# Patient Record
Sex: Male | Born: 1965 | Race: White | Hispanic: No | Smoking: Current every day smoker
Health system: Southern US, Community
[De-identification: ages and names within clinical notes are randomized; demographics above are authoritative.]

## PROBLEM LIST (undated history)

## (undated) DIAGNOSIS — G43909 Migraine, unspecified, not intractable, without status migrainosus: Secondary | ICD-10-CM

## (undated) DIAGNOSIS — N289 Disorder of kidney and ureter, unspecified: Secondary | ICD-10-CM

## (undated) DIAGNOSIS — E119 Type 2 diabetes mellitus without complications: Secondary | ICD-10-CM

## (undated) DIAGNOSIS — K219 Gastro-esophageal reflux disease without esophagitis: Secondary | ICD-10-CM

## (undated) DIAGNOSIS — I10 Essential (primary) hypertension: Secondary | ICD-10-CM

---

## 2012-10-05 ENCOUNTER — Observation Stay (HOSPITAL_COMMUNITY)
Admission: EM | Admit: 2012-10-05 | Discharge: 2012-10-06 | Disposition: A | Discharge status: Home / Self Care | Payer: MEDICAID | Attending: Family Medicine | Admitting: Family Medicine

## 2012-10-05 ENCOUNTER — Encounter (HOSPITAL_COMMUNITY): Payer: Self-pay | Admitting: *Deleted

## 2012-10-05 ENCOUNTER — Emergency Department (HOSPITAL_COMMUNITY): Payer: Self-pay

## 2012-10-05 DIAGNOSIS — R079 Chest pain, unspecified: Secondary | ICD-10-CM | POA: Insufficient documentation

## 2012-10-05 DIAGNOSIS — E860 Dehydration: Secondary | ICD-10-CM

## 2012-10-05 DIAGNOSIS — M79609 Pain in unspecified limb: Secondary | ICD-10-CM | POA: Insufficient documentation

## 2012-10-05 DIAGNOSIS — Z720 Tobacco use: Secondary | ICD-10-CM | POA: Diagnosis present

## 2012-10-05 DIAGNOSIS — I10 Essential (primary) hypertension: Secondary | ICD-10-CM

## 2012-10-05 DIAGNOSIS — R110 Nausea: Secondary | ICD-10-CM | POA: Diagnosis present

## 2012-10-05 DIAGNOSIS — F17200 Nicotine dependence, unspecified, uncomplicated: Secondary | ICD-10-CM | POA: Insufficient documentation

## 2012-10-05 DIAGNOSIS — R059 Cough, unspecified: Secondary | ICD-10-CM | POA: Insufficient documentation

## 2012-10-05 DIAGNOSIS — R05 Cough: Secondary | ICD-10-CM | POA: Insufficient documentation

## 2012-10-05 DIAGNOSIS — R42 Dizziness and giddiness: Principal | ICD-10-CM | POA: Diagnosis present

## 2012-10-05 HISTORY — DX: Gastro-esophageal reflux disease without esophagitis: K21.9

## 2012-10-05 HISTORY — DX: Migraine, unspecified, not intractable, without status migrainosus: G43.909

## 2012-10-05 LAB — COMPREHENSIVE METABOLIC PANEL
ALT: 21 U/L (ref 0–53)
CO2: 28 mEq/L (ref 19–32)
Calcium: 10.2 mg/dL (ref 8.4–10.5)
Creatinine, Ser: 0.83 mg/dL (ref 0.50–1.35)
GFR calc Af Amer: >90 mL/min (ref >90)
GFR calc non Af Amer: >90 mL/min (ref >90)
Glucose, Bld: 102 mg/dL — ABNORMAL HIGH (ref 70–99)
Sodium: 141 mEq/L (ref 135–145)
Total Protein: 7.6 g/dL (ref 6.0–8.3)

## 2012-10-05 LAB — CBC
MCH: 32.2 pg (ref 26.0–34.0)
MCHC: 37.6 g/dL — ABNORMAL HIGH (ref 30.0–36.0)
MCV: 85.9 fL (ref 78.0–100.0)
Platelets: 201 10*3/uL (ref 150–400)
RBC: 5.52 MIL/uL (ref 4.22–5.81)

## 2012-10-05 LAB — POCT I-STAT TROPONIN I

## 2012-10-05 MED ORDER — SODIUM CHLORIDE 0.9 % IV BOLUS (SEPSIS)
500.0000 mL | Freq: Once | INTRAVENOUS | Status: AC
  Administered 2012-10-05: 500 mL via INTRAVENOUS

## 2012-10-05 NOTE — ED Provider Notes (Signed)
History     CSN: [id number]  Arrival date & time 10/05/12  4098   First MD Initiated Contact with Patient 10/05/12 2001      Chief Complaint  Patient presents with  . Dizziness  . Arm Pain    (Consider location/radiation/quality/duration/timing/severity/associated sxs/prior treatment) Patient is a 47 y.o. male presenting with arm pain. The history is provided by the patient.  Arm Pain Associated symptoms include headaches. Pertinent negatives include no chest pain, no abdominal pain and no shortness of breath.   patient presents with dizziness. He states for the last 4 days she is having difficulty walking. He states when he stands up or sits up he gets lightheaded. He states it feels more like he is going to pass out and if he is spinning. He has had some episodes of chest pain also. He states that he had some nausea also. He states it is worse when he sits up. He states his been having difficulty walking. He has occasional headaches. He has chest pain, but is not necessarily associated with exertion. No fevers. No cough. 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 He states he sometimes feels his right ear pop. No localizing numbness or weakness. No abdominal pain. He states he has been gaining weight. He's had occasional cough. He does not see doctors.  Past Medical History  Diagnosis Date  . Migraines     History reviewed. No pertinent past surgical history.  No family history on file.  History  Substance Use Topics  . Smoking status: Current Every Day Smoker -- 2.00 packs/day    Types: Cigarettes  . Smokeless tobacco: Not on file  . Alcohol Use: No      Review of Systems  Constitutional: Negative for activity change, appetite change and fatigue.  HENT: Negative for neck stiffness.   Eyes: Negative for pain.  Respiratory: Negative for chest tightness and shortness of breath.   Cardiovascular: Negative for chest pain and leg swelling.  Gastrointestinal: Positive  for nausea. Negative for vomiting, abdominal pain and diarrhea.  Genitourinary: Negative for flank pain.  Musculoskeletal: Negative for back pain.  Skin: Negative for rash.  Neurological: Positive for light-headedness and headaches. Negative for tremors, seizures, syncope and numbness.  Psychiatric/Behavioral: Negative for behavioral problems.    Allergies  Codeine and Penicillins  Home Medications   Current Outpatient Rx  Name  Route  Sig  Dispense  Refill  . ibuprofen (ADVIL,MOTRIN) 200 MG tablet   Oral   Take 1,000 mg by mouth daily as needed for pain.           BP 126/63  Pulse 61  Temp(Src) 97.5 F (36.4 C) (Oral)  Resp 23  Ht 5\' 9"  (1.753 m)  Wt 215 lb (97.523 kg)  BMI 31.74 kg/m2  SpO2 91%  Physical Exam  Nursing note and vitals reviewed. Constitutional: He is oriented to person, place, and time. He appears well-developed and well-nourished.  HENT:  Head: Normocephalic and atraumatic.  Right Ear: External ear normal.  Mild erythema to left TM.  Eyes: EOM are normal. Pupils are equal, round, and reactive to light.  Neck: Normal range of motion. Neck supple.  Cardiovascular: Normal rate, regular rhythm and normal heart sounds.   No murmur heard. Pulmonary/Chest: Effort normal and breath sounds normal.  Abdominal: Soft. Bowel sounds are normal. He exhibits no distension and no mass. There is no tenderness. There is no rebound and no guarding.  Musculoskeletal: Normal range of motion. He  exhibits no edema.  Neurological: He is alert and oriented to person, place, and time. No cranial nerve deficit.  Mild nystagmus with gaze to right. Finger-nose intact bilaterally. Heel shin intact bilaterally. Patient became lightheaded and somewhat flushed when he sat up. Extraocular movements intact. Strength is intact bilaterally.  Skin: Skin is warm and dry.  Psychiatric: He has a normal mood and affect.    ED Course  Procedures (including critical care time)  Labs  Reviewed  CBC - Abnormal; Notable for the following:    Hemoglobin 17.8 (*)    MCHC 37.6 (*)    All other components within normal limits  COMPREHENSIVE METABOLIC PANEL - Abnormal; Notable for the following:    Glucose, Bld 102 (*)    All other components within normal limits  POCT I-STAT TROPONIN I   Dg Chest 2 View  10/05/2012  *RADIOLOGY REPORT*  Clinical Data: Cough.  Chest pain.  Dizziness.  Smoker.  CHEST - 2 VIEW  Comparison: None.  Findings: Cardiomediastinal silhouette unremarkable.  Linear scar or atelectasis in the lingula.  Mildly prominent bronchovascular markings and moderate central peribronchial thickening.  Lungs otherwise clear.  No pleural effusions. Healed the posterior right sixth rib fracture.  Mild degenerative changes involving the thoracic spine.  IMPRESSION: Minimal lingular scar/atelectasis.  Mild changes of bronchitis and/or asthma which may be acute or chronic.  No acute cardiopulmonary disease otherwise.   Original Report Authenticated By: Hulan Saas, M.D.    Ct Head Wo Contrast  10/05/2012  *RADIOLOGY REPORT*  Clinical Data:  Dizziness, nausea and vomiting.  History of migraine headaches.  CT HEAD WITHOUT CONTRAST  Technique: Contiguous axial images were obtained from the base of the skull through the vertex without contrast.  Comparison: None.  Findings: Normal appearing cerebral hemispheres and posterior fossa structures.  Normal size and position of the ventricles.  No intracranial hemorrhage, mass lesion or evidence of acute infarction.  Unremarkable bones and included portions of the paranasal sinuses.  IMPRESSION: Normal examination.   Original Report Authenticated By: Beckie Salts, M.D.      1. Lightheadedness   2. Nausea      Date: 10/05/2012  Rate: 55  Rhythm: sinus bradycardia  QRS Axis: normal  Intervals: normal  ST/T Wave abnormalities: normal  Conduction Disutrbances:none  Narrative Interpretation:   Old EKG Reviewed: none  available    MDM  Patient was lightheaded with sitting up or standing. He's had difficulty ambulating. He is a nonfocal exam except for some mild nystagmus, he does get flushed and feel like his to pass out with sitting or standing. Head CT is reassuring. Lab work is reassuring overall but does have an elevated hemoglobin.patient does not have a primary care doctor has no followup. He'll be admitted to the family practice [facility] for further evaluation treatment.        Juliet Rude. Rubin Payor, MD 10/06/12 1610

## 2012-10-05 NOTE — ED Notes (Addendum)
PT with intermittant L arm pain and neck pain, dizziness, emesis since Thursday.  States he doesn't go to doctors.  Denies chest pain.  States he does get migraines, but this time it is the dizziness that is bothering him. Pt photophobic.

## 2012-10-06 ENCOUNTER — Encounter (HOSPITAL_COMMUNITY): Payer: Self-pay | Admitting: General Practice

## 2012-10-06 DIAGNOSIS — F17200 Nicotine dependence, unspecified, uncomplicated: Secondary | ICD-10-CM

## 2012-10-06 DIAGNOSIS — R42 Dizziness and giddiness: Secondary | ICD-10-CM

## 2012-10-06 DIAGNOSIS — I10 Essential (primary) hypertension: Secondary | ICD-10-CM

## 2012-10-06 DIAGNOSIS — E860 Dehydration: Secondary | ICD-10-CM

## 2012-10-06 DIAGNOSIS — R110 Nausea: Secondary | ICD-10-CM

## 2012-10-06 HISTORY — DX: Essential (primary) hypertension: I10

## 2012-10-06 LAB — COMPREHENSIVE METABOLIC PANEL
ALT: 18 U/L (ref 0–53)
AST: 13 U/L (ref 0–37)
CO2: 26 mEq/L (ref 19–32)
Calcium: 8.6 mg/dL (ref 8.4–10.5)
Potassium: 3.8 mEq/L (ref 3.5–5.1)
Sodium: 139 mEq/L (ref 135–145)
Total Protein: 6.3 g/dL (ref 6.0–8.3)

## 2012-10-06 LAB — URINALYSIS, ROUTINE W REFLEX MICROSCOPIC
Bilirubin Urine: NEGATIVE
Glucose, UA: NEGATIVE mg/dL
Hgb urine dipstick: NEGATIVE
Ketones, ur: NEGATIVE mg/dL
Protein, ur: NEGATIVE mg/dL

## 2012-10-06 LAB — CBC
HCT: 42 % (ref 39.0–52.0)
Hemoglobin: 15.2 g/dL (ref 13.0–17.0)
MCH: 30.8 pg (ref 26.0–34.0)
MCHC: 36.2 g/dL — ABNORMAL HIGH (ref 30.0–36.0)

## 2012-10-06 LAB — RAPID URINE DRUG SCREEN, HOSP PERFORMED
Amphetamines: NOT DETECTED
Tetrahydrocannabinol: POSITIVE — AB

## 2012-10-06 MED ORDER — HYDROCHLOROTHIAZIDE 25 MG PO TABS
25.0000 mg | ORAL_TABLET | Freq: Every day | ORAL | Status: DC
  Administered 2012-10-06: 25 mg via ORAL
  Filled 2012-10-06: qty 1

## 2012-10-06 MED ORDER — MECLIZINE HCL 50 MG PO TABS: 50.0000 mg | ORAL_TABLET | Freq: Three times a day (TID) | ORAL | Status: DC | PRN

## 2012-10-06 MED ORDER — MECLIZINE HCL 25 MG PO TABS
50.0000 mg | ORAL_TABLET | Freq: Three times a day (TID) | ORAL | Status: DC
  Administered 2012-10-06: 50 mg via ORAL
  Filled 2012-10-06 (×2): qty 2

## 2012-10-06 MED ORDER — ONDANSETRON HCL 4 MG PO TABS: 4.0000 mg | ORAL_TABLET | Freq: Four times a day (QID) | ORAL | Status: DC | PRN

## 2012-10-06 MED ORDER — SODIUM CHLORIDE 0.9 % IJ SOLN
3.0000 mL | Freq: Two times a day (BID) | INTRAMUSCULAR | Status: DC
  Administered 2012-10-06: 3 mL via INTRAVENOUS

## 2012-10-06 MED ORDER — ACETAMINOPHEN 325 MG PO TABS: 650.0000 mg | ORAL_TABLET | Freq: Four times a day (QID) | ORAL | Status: DC | PRN

## 2012-10-06 MED ORDER — NICOTINE 21 MG/24HR TD PT24
21.0000 mg | MEDICATED_PATCH | Freq: Every day | TRANSDERMAL | Status: DC
  Administered 2012-10-06: 21 mg via TRANSDERMAL
  Filled 2012-10-06 (×2): qty 1

## 2012-10-06 MED ORDER — PANTOPRAZOLE SODIUM 40 MG PO TBEC
40.0000 mg | DELAYED_RELEASE_TABLET | Freq: Every day | ORAL | Status: DC
  Administered 2012-10-06: 40 mg via ORAL
  Filled 2012-10-06: qty 1

## 2012-10-06 MED ORDER — OMEPRAZOLE 20 MG PO CPDR: 20.0000 mg | DELAYED_RELEASE_CAPSULE | Freq: Every day | ORAL | Status: DC

## 2012-10-06 MED ORDER — ACETAMINOPHEN 650 MG RE SUPP: 650.0000 mg | Freq: Four times a day (QID) | RECTAL | Status: DC | PRN

## 2012-10-06 MED ORDER — HYDROCHLOROTHIAZIDE 25 MG PO TABS: 25.0000 mg | ORAL_TABLET | Freq: Every day | ORAL | Status: DC

## 2012-10-06 MED ORDER — ONDANSETRON HCL 4 MG/2ML IJ SOLN: 4.0000 mg | Freq: Four times a day (QID) | INTRAMUSCULAR | Status: DC | PRN

## 2012-10-06 MED ORDER — FLUTICASONE PROPIONATE 50 MCG/ACT NA SUSP
2.0000 | Freq: Every day | NASAL | Status: DC
  Administered 2012-10-06: 2 via NASAL
  Filled 2012-10-06: qty 16

## 2012-10-06 MED ORDER — CALCIUM CARBONATE ANTACID 500 MG PO CHEW
1.0000 | CHEWABLE_TABLET | Freq: Three times a day (TID) | ORAL | Status: DC
  Administered 2012-10-06: 200 mg via ORAL
  Filled 2012-10-06 (×4): qty 1

## 2012-10-06 MED ORDER — MECLIZINE HCL 25 MG PO TABS: 50.0000 mg | ORAL_TABLET | Freq: Three times a day (TID) | ORAL | Status: DC | PRN

## 2012-10-06 MED ORDER — SODIUM CHLORIDE 0.9 % IV SOLN
INTRAVENOUS | Status: DC
  Administered 2012-10-06: 02:00:00 via INTRAVENOUS

## 2012-10-06 NOTE — Care Management Note (Signed)
    Page 1 of 1   10/06/2012     2:15:17 PM   CARE MANAGEMENT NOTE 10/06/2012  Patient:  Julian Atkinson, Julian Atkinson   Account Number:  [id number]  Date Initiated:  10/06/2012  Documentation initiated by:  GRAVES-BIGELOW,Yariana Hoaglund  Subjective/Objective Assessment:   Pt admitted with increasing dizziness and neds outpatient vestibular therapy. Pt is from home with wife. Pt is self employed and has no insurance.     Action/Plan:   CM did speak to pt in regards to finance and then CM called [facility]. Pt has no PCP and the [facility] clinic will f/u outpatient. CM will monitor.   Anticipated DC Date:  10/06/2012   Anticipated DC Plan:  HOME/SELF CARE  In-house referral  Financial Counselor      DC Planning Services  CM consult  Medication Assistance  Indigent [facility] Clinic      Choice offered to / List presented to:             Status of service:  Completed, signed off Medicare Important Message given?   (If response is "NO", the following Medicare IM given date fields will be blank) Date Medicare IM given:   Date Additional Medicare IM given:    Discharge Disposition:  HOME/SELF CARE  Per UR Regulation:  Reviewed for med. necessity/level of care/duration of stay  If discussed at Long Length of Stay Meetings, dates discussed:    Comments:  10-06-12 [address] [place], [place] [phone] CM did speak to pt and he is agreeable to outpatient PT services. CM will fax information to [phone] and they will call pt to set up an appointment. FC did call pt and a financial applicaiton will be sent to pt and he to fill out and turn in. CM did fax information to [facility] for services. No further needs from CM at this time.

## 2012-10-06 NOTE — Evaluation (Signed)
Occupational Therapy Evaluation Patient Details Name: Julian Atkinson MRN: [id number] DOB: Sep 09, 1965 Today's Date: 10/06/2012 Time: 4098-1191 (first attempt 801 - 814) OT Time Calculation (min): 25 min  OT Assessment / Plan / Recommendation Clinical Impression  47 yo male admitted with dizziness > 5 days with n/v , diaphoresis flushed and chills. Ot to follow acutely. Recommend outpatient vestibular follow up    OT Assessment  Patient needs continued OT Services    Follow Up Recommendations  Outpatient OT    Barriers to Discharge      Equipment Recommendations       Recommendations for Other Services    Frequency  Min 2X/week    Precautions / Restrictions Precautions Precautions: Fall   Pertinent Vitals/Pain 9 out 10 dizziness at end of session Due to vestibular testing - mobility not tested at this time. OT/ PT to follow up for mobility    ADL  Eating/Feeding: Modified independent Where Assessed - Eating/Feeding: Bed level ADL Comments: Session focused on vestibular testing. Pt on arrival 1 out 10 dizziness. Pt describes dizziness as patient spinning, Hx of deafness in Lt ear since childhood, sinus congestion, bil ear popping (like being in the mountains was patient description), mother with history of vestibular deficits, spinning last for > 10 hours each time, pt has sound / light sensitivity, and dizziness with all neck extension    OT Diagnosis:    OT Problem List: Impaired balance (sitting and/or standing);Decreased coordination OT Treatment Interventions: Self-care/ADL training;DME and/or AE instruction;Therapeutic activities;Patient/family education;Balance training   OT Goals Acute Rehab OT Goals OT Goal Formulation: With patient/family Time For Goal Achievement: 10/20/12 Potential to Achieve Goals: Good ADL Goals Pt Will Perform Grooming: Independently;Supported ADL Goal: Grooming - Progress: Goal set today Pt Will Transfer to Toilet: Independently;Regular  height toilet ADL Goal: Statistician - Progress: Goal set today  Visit Information  Last OT Received On: 10/06/12 Assistance Needed: +1 PT/OT Co-Evaluation/Treatment: Yes    Subjective Data  Subjective: "this is the longest it's ever latest before"- pt reports now 7 days of dizziness and symptoms Patient Stated Goal: to return home    Prior Functioning     Home Living Lives With: Spouse Available Help at Discharge: Family Prior Function Driving: Yes Vocation: Full time employment Communication Communication: No difficulties Dominant Hand: Right         Vision/Perception Vision - History Baseline Vision: No visual deficits Vision - Assessment Eye Alignment: Within Functional Limits Vision Assessment: Vision tested Ocular Range of Motion: Within Functional Limits Saccades: Within functional limits Additional Comments: Pt provided  VOR Cancellation with positive result, Pt with neck flexion / extension with gaze stablization negative, neck rotation right / left with negative results, pt with postive head thrust test with Lt > RT. Pt with Lt side deafness as well. Pt provided dix hallpike with no nystagmus noted. Pt did report dizziness. Pt with 9 out 10 dizziness s./p testing. pt with greater dizziness with horizontal head movement than vertical this session    Cognition  Cognition Overall Cognitive Status: Appears within functional limits for tasks assessed/performed Arousal/Alertness: Awake/alert Orientation Level: Appears intact for tasks assessed Behavior During Session: [facility] for tasks performed    Extremity/Trunk Assessment Right Upper Extremity Assessment RUE ROM/Strength/Tone: Within functional levels Left Upper Extremity Assessment LUE ROM/Strength/Tone: Within functional levels Right Lower Extremity Assessment RLE ROM/Strength/Tone: WFL for tasks assessed (not formally assessed for strength ROM) RLE Coordination: WFL - gross motor (little trouble with toe  tapping other WNL) Left  Lower Extremity Assessment LLE Coordination: WFL - gross motor (little trouble with toe tapping other wise WNL)     Mobility Bed Mobility Bed Mobility: Supine to Sit;Sit to Supine Supine to Sit: 7: Independent Sit to Supine: 7: Independent Transfers Transfers: Not assessed     Exercise Other Exercises Other Exercises: Oculomotor assessment performed, see OT notes for details Other Exercises: Positional testing performed: see OT notes for details   Balance Balance Balance Assessed: Yes Static Sitting Balance Static Sitting - Balance Support: Feet supported Static Sitting - Level of Assistance: 7: Independent Static Sitting - Comment/# of Minutes: approx 15 minutes during vestibular exam   End of Session OT - End of Session Activity Tolerance: Patient tolerated treatment well Patient left: in bed;with call bell/phone within reach;with family/visitor present Nurse Communication: Mobility status  GO     Harrel Carina [facility] 10/06/2012, 10:18 AM Pager: [phone]

## 2012-10-06 NOTE — H&P (Signed)
History and Physical Note Family Medicine Teaching Service Julian Quigley M. Mikel Cella, MD Service Pager: [phone]  Julian Atkinson is an 47 y.o. male.   Chief Complaint: lightheadedness HPI: Pt is a 47 yo M with PMH of migraines who presented to the ED with a 6 day history of lightheadedness. He states with any position change he develops the sensation of feeling lightheaded (Not room spinning or dizzy) with associated diaphoresis, flushed/chills and N/V. He states he has had intermittent dizziness in the past that resolves within 24 hours with rest but this episode is different. He has not had a migraine lately, and does not report a headache associated with current symptoms. He states he does not lose his vision or feel like he is going to black out. Episodes resolve with lying down flat and he does not really know if moving his head makes it worse, he only notices it with body position changes. Denies fevers, recent illnesses, chest pain, shortness of breath, rash weakness or numbness. Does endorse neck pain. He has been able to eat and drink normally with no nausea until he sits or stands up.  ED course: Received 1L total of fluids. Head CT negative. Labs show elevated HgB. Pt is not able to stand or ambulate, therefore FMTS was called for admission.  Past Medical History  Diagnosis Date  . Migraines    History reviewed. No pertinent past surgical history.  Family History: Mom has migraines and vertigo. Dad died of MI at age 83  Social History:  reports that he has been smoking Cigarettes.  He has been smoking about 2.00 packs per day. He does not have any smokeless tobacco history on file. He reports that he does not drink alcohol. His drug history is not on file. Lives with wife. Works as a Photographer.  Allergies:  Allergies  Allergen Reactions  . Codeine Nausea And Vomiting  . Penicillins Other (See Comments)    Childhood reaction   Prior to Admission medications   Medication  Sig Start Date End Date Taking? Authorizing Provider  ibuprofen (ADVIL,MOTRIN) 200 MG tablet Take 1,000 mg by mouth daily as needed for pain.   Yes Historical Provider, MD    Results for orders placed during the hospital encounter of 10/05/12 (from the past 48 hour(s))  CBC     Status: Abnormal   Collection Time    10/05/12  6:56 PM      Result Value Range   WBC 9.9  4.0 - 10.5 K/uL   RBC 5.52  4.22 - 5.81 MIL/uL   Hemoglobin 17.8 (*) 13.0 - 17.0 g/dL   HCT 53.6  64.4 - 03.4 %   MCV 85.9  78.0 - 100.0 fL   MCH 32.2  26.0 - 34.0 pg   MCHC 37.6 (*) 30.0 - 36.0 g/dL   Comment: RULED OUT INTERFERING SUBSTANCES   RDW 11.9  11.5 - 15.5 %   Platelets 201  150 - 400 K/uL  COMPREHENSIVE METABOLIC PANEL     Status: Abnormal   Collection Time    10/05/12  6:56 PM      Result Value Range   Sodium 141  135 - 145 mEq/L   Potassium 3.7  3.5 - 5.1 mEq/L   Chloride 101  96 - 112 mEq/L   CO2 28  19 - 32 mEq/L   Glucose, Bld 102 (*) 70 - 99 mg/dL   BUN 14  6 - 23 mg/dL   Creatinine, Ser 7.42  0.50 -  1.35 mg/dL   Calcium 16.1  8.4 - 09.6 mg/dL   Total Protein 7.6  6.0 - 8.3 g/dL   Albumin 4.0  3.5 - 5.2 g/dL   AST 15  0 - 37 U/L   ALT 21  0 - 53 U/L   Alkaline Phosphatase 78  39 - 117 U/L   Total Bilirubin 0.5  0.3 - 1.2 mg/dL   GFR calc non Af Amer >90  >90 mL/min   GFR calc Af Amer >90  >90 mL/min   Comment:            The eGFR has been calculated     using the CKD EPI equation.     This calculation has not been     validated in all clinical     situations.     eGFR's persistently     <90 mL/min signify     possible Chronic Kidney Disease.  POCT I-STAT TROPONIN I     Status: None   Collection Time    10/05/12  7:09 PM      Result Value Range   Troponin i, poc 0.00  0.00 - 0.08 ng/mL   Comment 3            Comment: Due to the release kinetics of cTnI,     a negative result within the first hours     of the onset of symptoms does not rule out     myocardial infarction with  certainty.     If myocardial infarction is still suspected,     repeat the test at appropriate intervals.   Dg Chest 2 View  10/05/2012  *RADIOLOGY REPORT*  Clinical Data: Cough.  Chest pain.  Dizziness.  Smoker.  CHEST - 2 VIEW  Comparison: None.  Findings: Cardiomediastinal silhouette unremarkable.  Linear scar or atelectasis in the lingula.  Mildly prominent bronchovascular markings and moderate central peribronchial thickening.  Lungs otherwise clear.  No pleural effusions. Healed the posterior right sixth rib fracture.  Mild degenerative changes involving the thoracic spine.  IMPRESSION: Minimal lingular scar/atelectasis.  Mild changes of bronchitis and/or asthma which may be acute or chronic.  No acute cardiopulmonary disease otherwise.   Original Report Authenticated By: Hulan Saas, M.D.    Ct Head Wo Contrast  10/05/2012  *RADIOLOGY REPORT*  Clinical Data:  Dizziness, nausea and vomiting.  History of migraine headaches.  CT HEAD WITHOUT CONTRAST  Technique: Contiguous axial images were obtained from the base of the skull through the vertex without contrast.  Comparison: None.  Findings: Normal appearing cerebral hemispheres and posterior fossa structures.  Normal size and position of the ventricles.  No intracranial hemorrhage, mass lesion or evidence of acute infarction.  Unremarkable bones and included portions of the paranasal sinuses.  IMPRESSION: Normal examination.   Original Report Authenticated By: Beckie Salts, M.D.     ROS See HPI above for pertinent findings. Otherwise, ROS negative.  Blood pressure 126/63, pulse 61, temperature 97.5 F (36.4 C), temperature source Oral, resp. rate 23, height 5\' 9"  (1.753 m), weight 215 lb (97.523 kg), SpO2 91.00%. Physical Exam  Gen: Awake, alert. Lying down in bed in no distress HEENT: AT, [place]. MMM, some posterior pharynx erythema. EOM with few small beats of horizontal nystagmus appreciated. Pt had difficultly looking up due to dizziness.   Neck: Supple, no bony tenderness. No LAD Cardio: RRR, no murmur appreciated Pulm: CTAB Abd: Obese, soft, nontender Extremities: No edema or rashes Neuro:   CN: Smile  symmetric, intact EOM. No sensory changes. Shoulder shrug normal. Decreased hearing on left due to chronic hearing loss.   UE: 5/5 strength bilaterally with no sensory changes.  LE: 5/5 strength bilaterally. No sensory changes appreciated.  Coordination: Heel to shin normal. Did not stand due to increased lightheaded with sitting and fall risk without assistance in the emergency room.  Assessment/Plan 47 yo previously healthy M with 6 day history of feeling lightheaded with positional changes  # Lightheaded - Pt with history of migraines now with lightheaded/flushed/diaphoretic with sitting or standing, as well as noted to have nystagmus on exam. Sounds most consistent with peripheral vestibular dysfunction given the exam findings, history of migraines, N/V. Central vertigo is in differential but less likely without focal neurologic findings. Other differential diagnosis include dehydration (hemoconcentrated on CBC), arrhythmia (not seen on EKG or telemetry), complex status migrainosus, stroke (not seen on CT scan), meningitis (no fevers or elevated WBC but does report vague neck pain) or orthostatic hypotension (although not seen on vital signs at arrival.) - Observation status, Dr. Leveda Anna attending - Monitor on telemetry for any cardiac cause. Could likely d/c tele tomorrow if no events seen - Fall precautions, up with assistance - Meclizine for symptomatic relief - Zofran as needed for nausea   - UA and drug screen now - Repeat CBC and CMet in the AM - Continue fluids at 100cc/hr and encourage po intake - PT and OT evaluation  - Vestibular rehab evaluation - I did not perform Dix-Hallpike since patient had nystagmus on exam. I also did not perform Epley given constraints of emergency department, but this could be considered  as well - No additional labs ordered, but could consider TSH - Neuro checks q shift - Orthostatic vital signs in the morning  # Tobacco use- Pt smokes 2 ppd - Nicotine patch q24 hours starting now - Nurse to provide smoking cessation information  #FEN/GI- Regular diet. NS @ 100cc/hr overnight # PPX- SCD # Code status- Full code # Dispo- Pending further work up and evaluation.   Noela Brothers 10/06/2012, 12:02 AM

## 2012-10-06 NOTE — Progress Notes (Signed)
FMTS Attending Daily Note: Sara Neal MD [phone] pager office [phone] I  have seen and examined this patient, reviewed their chart. I have discussed this patient with the resident. I agree with the resident's findings, assessment and care plan. 

## 2012-10-06 NOTE — Progress Notes (Signed)
Pt discharged to home per MD order. Pt received and reviewed all discharge instructions and medication information including follow-up appointments and prescriptions. Pt verbalized understanding. Pt alert and oriented at discharge with no complaints of pain.  Pt escorted to private vehicle via wheelchair by nurse tech. Coates, Chantilly Linskey Elizabeth  

## 2012-10-06 NOTE — Evaluation (Signed)
Physical Therapy Evaluation Patient Details Name: Julian Atkinson MRN: [id number] DOB: 1966/03/23 Today's Date: 10/06/2012 Time: 4098-1191 PT Time Calculation (min): 24 min  PT Assessment / Plan / Recommendation Clinical Impression  Patient is a 47 y/o male admitted with acute onset dizziness and nausea lasting 7 days.  He has history of similar problems most of his adult life, however not lasting this long.  He presents with symptoms likely most resembling vestibular neuritis with long h/o left ear hearing loss, positve refixation with left head thrust test and right gaze holding nystagmus.  He will benefit from skilled PT in the acute setting and outpatient vestibular rehab to improve symptoms and return to independent with IADL's.  Will see one more visit today to further assess mobility and instruct in initial vestibular HEP.    PT Assessment  Patient needs continued PT services    Follow Up Recommendations  Outpatient PT (Neurorehab)          Equipment Recommendations  None recommended by PT       Frequency Min 3X/week    Precautions / Restrictions Precautions Precautions: Fall   Pertinent Vitals/Pain Denies pain      Mobility  Bed Mobility Bed Mobility: Supine to Sit;Sit to Supine Supine to Sit: 7: Independent Sit to Supine: 7: Independent    Exercises Other Exercises Other Exercises: Oculomotor assessment performed, see OT notes for details Other Exercises: Positional testing performed: see OT notes for details   PT Diagnosis: Other (comment) (Dizziness and Giddiness)  PT Problem List: Decreased activity tolerance;Decreased mobility PT Treatment Interventions: Gait training;Balance training;Neuromuscular re-education;Patient/family education;Therapeutic activities   PT Goals Acute Rehab PT Goals PT Goal Formulation: With patient Time For Goal Achievement: 10/08/12 Potential to Achieve Goals: Good Pt will go Sit to Stand: Independently PT Goal: Sit to Stand -  Progress: Goal set today Pt will go Stand to Sit: Independently PT Goal: Stand to Sit - Progress: Goal set today Pt will Ambulate: >150 feet;with modified independence;with least restrictive assistive device PT Goal: Ambulate - Progress: Goal set today Pt will Perform Home Exercise Program: Independently PT Goal: Perform Home Exercise Program - Progress: Goal set today Additional Goals Additional Goal #1: Patient independent in visual compensation strategies to allow independent or modified independent ambulation as noted above with dizziness </=6/10 PT Goal: Additional Goal #1 - Progress: Goal set today  Visit Information  Last PT Received On: 10/06/12 Assistance Needed: +1    Subjective Data  Subjective: I have been dizzy 7 days this time.  Hits me usually 2-3x/yr lasts several hours and can sleep it off.  No headache though history of migraines.  Has left hearing loss since child.  Episodes started around 56 years of age.  Feels like does after spinning around, lasts several hours, has diaphoresis and nausea and popping right ear (new this episode,) and h/o sinus trouble.  Feels this is worst episode ever.  Okay as long as flat in bed.  Noise and light aggravate, has difficulty with looking up.  Denies any falls or head trauma.  Unsure of onset of hearing loss (? as a child hit side of head in the car.)   Patient Stated Goal: Hopes to improve dizziness and figure out how to manage acute episodes.   Prior Functioning  Home Living Lives With: Spouse Available Help at Discharge: Family Prior Function Driving: Yes Vocation: Full time employment Communication Communication: No difficulties Dominant Hand: Right    Cognition  Cognition Overall Cognitive Status: Appears within  functional limits for tasks assessed/performed Arousal/Alertness: Awake/alert Orientation Level: Appears intact for tasks assessed Behavior During Session: [facility] for tasks performed    Extremity/Trunk Assessment  Right Lower Extremity Assessment RLE ROM/Strength/Tone: [facility] for tasks assessed (not formally assessed for strength ROM) RLE Coordination: WFL - gross motor (little trouble with toe tapping other WNL) Left Lower Extremity Assessment LLE Coordination: WFL - gross motor (little trouble with toe tapping other wise WNL)   Balance Balance Balance Assessed: Yes Static Sitting Balance Static Sitting - Balance Support: Feet supported Static Sitting - Level of Assistance: 7: Independent Static Sitting - Comment/# of Minutes: approx 15 minutes during vestibular exam  End of Session PT - End of Session Activity Tolerance: Other (comment) (limited by dizziness, nausea) Patient left: in bed;with family/visitor present  GP     [facility] 10/06/2012, 9:31 AM  Sheran Lawless, PT [phone] 10/06/2012

## 2012-10-06 NOTE — Discharge Summary (Signed)
Family Medicine Teaching [facility] Discharge Summary  Patient name: Julian Atkinson Medical record number: [id number] Date of birth: May 22, 1966 Age: 47 y.o. Gender: male Date of Admission: 10/05/2012  Date of Discharge: 10/06/12 Admitting Physician: Sanjuana Letters, MD  Primary Care Provider: Patient will   Indication for Hospitalization: Lightheadedness, Inability to ambulate  Discharge Diagnoses:  Principal Problem:   Lightheadedness Active Problems:   Tobacco abuse   Nausea   Dehydration   High blood pressure  Brief Hospital Course:  47 year old male with history of tobacco abuse and reported history of intermittent dizziness and migraine headache who presented with 6 day history of lightheadedness.  Lightheadedness was associated positional changes and accompanied by diaphoresis, flushing, and nausea/vomiting.    1) Lightheadedness - On admission, physical exam notable for nystagmus. - Symptoms improved following IV fluids, Meclizine, and Zofran. - Patient was seen by PT/OT during admission for likely peripheral vertigo. PT agreed with peripheral cause (vestibular neuritis) and recommend outpatient vestibular rehab.   2) Dehydration  - Patient found to be hemoconcentrated on admission with Hb of 17.8 - Patient was treated with IV fluids (1 L NS bolus, followed by NS at 100 mL/hr)  3) Tobacco abuse - Patient was given Nicotine patch during admission and also received smoking cessation counseling.  4) High blood pressure - Multiple blood pressure readings were elevated >140/90. - Patient was started on HCTZ 25 mg. - Will follow up with PCP regarding confirmation of HTN and titration/addition of medication.  Significant Labs and Imaging:   CBC BMET   Recent Labs Lab 10/05/12 1856 10/06/12 0447  WBC 9.9 7.7  HGB 17.8* 15.2  HCT 47.4 42.0  PLT 201 178    Recent Labs Lab 10/05/12 1856 10/06/12 0447  NA 141 139  K 3.7 3.8  CL 101 104  CO2 28 26  BUN  14 14  CREATININE 0.83 0.89  GLUCOSE 102* 100*  CALCIUM 10.2 8.6     Dg Chest 2 View  10/05/2012 IMPRESSION: Minimal lingular scar/atelectasis. Mild changes of bronchitis and/or asthma which may be acute or chronic. No acute cardiopulmonary disease otherwise.   Ct Head Wo Contrast  10/05/2012 IMPRESSION: Normal examination.   Procedures: None  Consultations: None  Discharge Medications:    Medication List    STOP taking these medications       ibuprofen 200 MG tablet  Commonly known as:  ADVIL,MOTRIN      TAKE these medications       hydrochlorothiazide 25 MG tablet  Commonly known as:  HYDRODIURIL  Take 1 tablet (25 mg total) by mouth daily.     meclizine 50 MG tablet  Commonly known as:  ANTIVERT  Take 1 tablet (50 mg total) by mouth 3 (three) times daily as needed.     omeprazole 20 MG capsule  Commonly known as:  PRILOSEC  Take 1 capsule (20 mg total) by mouth daily.       Issues for Follow Up:  1) Patient was hypertensive during admission.  Consider starting medication as outpatient. 2) Assess for improvement/resolution of symptoms  Outstanding Results: None  Discharge Instructions:  Patient was counseled important signs and symptoms that should prompt return to medical care, changes in medications, dietary instructions, activity restrictions, and follow up appointments.   Follow-up Information   Follow up with Everlene Other, DO On 10/13/2012. (1:45 pm)    Contact information:   [address] [place] [address] [phone]  Discharge Condition: Stable. Discharged home.  Adriana Simas Canadian, DO 10/06/2012, 12:12 PM

## 2012-10-06 NOTE — Progress Notes (Signed)
Physical Therapy Treatment Patient Details Name: Julian Atkinson MRN: [id number] DOB: 16-Sep-1965 Today's Date: 10/06/2012 Time: 8295-6213 PT Time Calculation (min): 25 min  PT Assessment / Plan / Recommendation Comments on Treatment Session  Patient able to participate in visual compensatory technique to assist with ambulation and decreased symptoms,  Did c/o increased symptoms with visual adaptation exercise, but not to point of nausea.  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      Follow Up Recommendations  Outpatient PT           Equipment Recommendations  None recommended by PT       Frequency Min 3X/week   Plan Discharge plan remains appropriate    Precautions / Restrictions Precautions Precautions: Fall   Pertinent Vitals/Pain No pain, dizziness 8/10 with adaptation exercises    Mobility  Bed Mobility Bed Mobility: Supine to Sit;Sit to Supine Supine to Sit: 7: Independent Sit to Supine: 7: Independent Transfers Transfers: Sit to Stand;Stand to Sit Sit to Stand: 7: Independent;From bed Stand to Sit: 7: Independent;To chair/3-in-1 Ambulation/Gait Ambulation/Gait Assistance: 5: Supervision Ambulation Distance (Feet): 250 Feet Assistive device: None Ambulation/Gait Assistance Details: no noted loss of balance or veering from straight path.  Was educated during ambulation on keeping focus on stationary item in front while walking to decrease symptoms.  Has 5/10 dizziness with ambulation.  Noted rightward nystagmus with turning  Gait Pattern: Step-through pattern;Within Functional Limits    Exercises Other Exercises Other Exercises: Performed seated gaze stabilization x 30 seconds each with horizontal then vertical head movements.  Educated in frequency of exercise and in parameters for head speed, seated position till Friday, then standing holding  counter, then hopeful to get to outpatient rehab.  Issued written handout for HEP and information on vestibular disorders from [employer] website.  Pt concerned for cost due to no insurance.  Case mgr working on indigent care. Other Exercises: Positional testing performed: see OT notes for details   PT Diagnosis: Other (comment) (Dizziness and Giddiness)  PT Problem List: Decreased activity tolerance;Decreased mobility PT Treatment Interventions: Gait training;Balance training;Neuromuscular re-education;Patient/family education;Therapeutic activities   PT Goals Acute Rehab PT Goals PT Goal Formulation: With patient Time For Goal Achievement: 10/08/12 Potential to Achieve Goals: Good Pt will go Sit to Stand: Independently PT Goal: Sit to Stand - Progress: Met Pt will go Stand to Sit: Independently PT Goal: Stand to Sit - Progress: Met Pt will Ambulate: >150 feet;with modified independence;with least restrictive assistive device PT Goal: Ambulate - Progress: Met Pt will Perform Home Exercise Program: Independently PT Goal: Perform Home Exercise Program - Progress: Progressing toward goal Additional Goals Additional Goal #1: Patient independent in visual compensation strategies to allow independent or modified independent ambulation as noted above with dizziness </=6/10 PT Goal: Additional Goal #1 - Progress: Met  Visit Information  Last PT Received On: 10/06/12 Assistance Needed: +1    Subjective Data  Subjective: You made me feel worse. Patient Stated Goal: Hopes to improve dizziness and figure out how to manage acute episodes.   Cognition  Cognition Overall Cognitive Status: Appears within functional limits for tasks assessed/performed Arousal/Alertness: Awake/alert Orientation Level: Appears intact for tasks assessed Behavior During Session: [facility] for tasks performed    Balance  Balance Balance Assessed: Yes Static Sitting Balance Static Sitting - Balance Support: Feet  supported Static Sitting - Level of Assistance:  7: Independent Static Sitting - Comment/# of Minutes: approx 15 minutes during vestibular exam  End of Session PT - End of Session Equipment Utilized During Treatment: Gait belt Activity Tolerance: Other (comment) (limited by dizziness, nausea) Patient left: in chair;with family/visitor present Nurse Communication: Other (comment) (need for medicine for dizziness)   GP     [facility] 10/06/2012, 11:29 AM Sheran Lawless, PT [phone] 10/06/2012

## 2012-10-06 NOTE — H&P (Signed)
Seen and examined.  Discussed with Dr. Mikel Cella.  Agree with admit, her documentation and management.  Briefly, 47 yo male with six day hx of dizziness:  Issues: Dizziness: etiology uncertain.  Symptoms most compatible with vertigo thus pointing me to an inner ear problem.  Abrupt onset makes labyrinthitis or Menire's disease most likely.  (This is not his first episode but it is his most severe.)  He did have abrupt onset raising the possibility of brainstem CVA.  Given recurrent nature, lack of other neuro sx and nl head CT, I believe CVA highly unlikely and do not intend an inpatient CVA work up.   Main reason for admit was unsteady gait and risk of falls.  Neurovestibular PT seeing now which should help with ambulation safety and differential dx.  Presuming safe ambulation, I believe we can DC this afternoon and follow up as outpatient.

## 2012-10-06 NOTE — Progress Notes (Signed)
Family Medicine Teaching Service Daily Progress Note Service Page: [phone]  Subjective:  Feeling slightly improved this am but continues to have dizziness/lightheadedness.  Objective: Temp:  [97.5 F (36.4 C)-98.1 F (36.7 C)] 97.7 F (36.5 C) (03/19 0550) Pulse Rate:  [56-79] 79 (03/19 0557) Resp:  [15-23] 18 (03/19 0550) BP: (119-161)/(60-88) 147/88 mmHg (03/19 0557) SpO2:  [91 %-98 %] 97 % (03/19 0557) Weight:  [213 lb 4.8 oz (96.752 kg)-215 lb (97.523 kg)] 213 lb 4.8 oz (96.752 kg) (03/19 0143)  Exam: General: sitting up in bed. NAD. Cardiovascular: RRR. No murmurs, rubs, or gallops. Respiratory: CTAB. No rales, rhonchi, or wheeze. Abdomen: soft, nontender, nondistended. Extremities: warm, well perfused.  CBC BMET   Recent Labs Lab 10/05/12 1856 10/06/12 0447  WBC 9.9 7.7  HGB 17.8* 15.2  HCT 47.4 42.0  PLT 201 178    Recent Labs Lab 10/05/12 1856 10/06/12 0447  NA 141 139  K 3.7 3.8  CL 101 104  CO2 28 26  BUN 14 14  CREATININE 0.83 0.89  GLUCOSE 102* 100*  CALCIUM 10.2 8.6     Imaging/Diagnostic Tests:  Dg Chest 2 View 10/05/2012   IMPRESSION: Minimal lingular scar/atelectasis.  Mild changes of bronchitis and/or asthma which may be acute or chronic.  No acute cardiopulmonary disease otherwise.  Ct Head Wo Contrast 10/05/2012  IMPRESSION: Normal examination.     Assessment/Plan: 47 yo previously healthy M with 6 day history of feeling lightheaded with positional changes.  # Lightheadedness - Likely peripheral vertigo. DDx - Peripheral, Central vertigo, dehydration, arrythmia, stroke, orthostasis.   - Patient treated with 1 L NS bolus (followed by 100 mL/hr) and PRN Meclizine and Zofran - Further workup negative: EKG - NSR. CT head negative. Orthostatic vitals negative. - Will schedule Meclizine today and add benzodiazepine if needed.  - PT evaluated patient this am and reported diagnosis likely vestibular neuritis and patient will need outpatient  vestibular rehab.  # High blood pressure - Multiple BP recordings >140/90. - Starting HCTZ today.  # Dehydration - Hemoconcentrated on admission. - Treated with IV fluids.  Patient now tolerating PO intake.  - Will discontinue IV fluids today.  # Tobacco use - Will continue Nicotine patch  FEN/GI- Regular diet. PPX- SCD  Dispo- Pending further work up and evaluation. Will consult case management today as patient has no insurance and will need PT on discharge. Code status - Full code   Everlene Other, DO 10/06/2012, 7:57 AM

## 2012-10-08 NOTE — Discharge Summary (Signed)
Family Medicine Teaching Service  Discharge Note : Attending Adaley Kiene MD Pager [phone] Office [phone] I have seen and examined this patient, reviewed their chart and discussed discharge planning wit the resident at the time of discharge. I agree with the discharge plan as above.  

## 2012-10-13 ENCOUNTER — Inpatient Hospital Stay: Payer: Self-pay | Admitting: Family Medicine

## 2013-04-20 ENCOUNTER — Other Ambulatory Visit (HOSPITAL_COMMUNITY): Payer: Self-pay | Admitting: Family Medicine

## 2013-08-27 ENCOUNTER — Encounter (HOSPITAL_COMMUNITY): Payer: Self-pay | Admitting: Emergency Medicine

## 2013-08-27 ENCOUNTER — Emergency Department (HOSPITAL_COMMUNITY)
Admission: EM | Admit: 2013-08-27 | Discharge: 2013-08-27 | Discharge status: Against Medical Advice | Payer: Self-pay | Attending: Emergency Medicine | Admitting: Emergency Medicine

## 2013-08-27 DIAGNOSIS — Z79899 Other long term (current) drug therapy: Secondary | ICD-10-CM | POA: Insufficient documentation

## 2013-08-27 DIAGNOSIS — K219 Gastro-esophageal reflux disease without esophagitis: Secondary | ICD-10-CM | POA: Insufficient documentation

## 2013-08-27 DIAGNOSIS — Z880 Allergy status to penicillin: Secondary | ICD-10-CM | POA: Insufficient documentation

## 2013-08-27 DIAGNOSIS — R109 Unspecified abdominal pain: Secondary | ICD-10-CM | POA: Insufficient documentation

## 2013-08-27 DIAGNOSIS — I10 Essential (primary) hypertension: Secondary | ICD-10-CM | POA: Insufficient documentation

## 2013-08-27 DIAGNOSIS — F17200 Nicotine dependence, unspecified, uncomplicated: Secondary | ICD-10-CM | POA: Insufficient documentation

## 2013-08-27 DIAGNOSIS — R319 Hematuria, unspecified: Secondary | ICD-10-CM | POA: Insufficient documentation

## 2013-08-27 HISTORY — DX: Essential (primary) hypertension: I10

## 2013-08-27 LAB — URINALYSIS, ROUTINE W REFLEX MICROSCOPIC
BILIRUBIN URINE: NEGATIVE
GLUCOSE, UA: NEGATIVE mg/dL
KETONES UR: NEGATIVE mg/dL
NITRITE: NEGATIVE
PH: 6 (ref 5.0–8.0)
Protein, ur: 30 mg/dL — AB
SPECIFIC GRAVITY, URINE: 1.028 (ref 1.005–1.030)
Urobilinogen, UA: 1 mg/dL (ref 0.0–1.0)

## 2013-08-27 LAB — URINE MICROSCOPIC-ADD ON

## 2013-08-27 MED ORDER — TRAMADOL HCL 50 MG PO TABS: 50.0000 mg | ORAL_TABLET | Freq: Four times a day (QID) | ORAL | Status: DC | PRN

## 2013-08-27 MED ORDER — TAMSULOSIN HCL 0.4 MG PO CAPS: 0.4000 mg | ORAL_CAPSULE | Freq: Every day | ORAL | Status: DC

## 2013-08-27 NOTE — Discharge Instructions (Signed)
You are leaving the Emergency Department against medical advised.  Please follow up with your primary care physician in 1-2 days. If you do not have one please [facility] number listed above. Please follow up with the urologist to schedule a follow up appointment if you are able to pass a kidney stone. Please take pain medication as prescribed and as needed for pain. Please do not drive on narcotic pain medication. Please take Flomax as prescribed. Please read all discharge instructions and return precautions.    Hematuria, Adult Hematuria is blood in your urine. It can be caused by a bladder infection, kidney infection, prostate infection, kidney stone, or cancer of your urinary tract. Infections can usually be treated with medicine, and a kidney stone usually will pass through your urine. If neither of these is the cause of your hematuria, further workup to find out the reason may be needed. It is very important that you tell your health care provider about any blood you see in your urine, even if the blood stops without treatment or happens without causing pain. Blood in your urine that happens and then stops and then happens again can be a symptom of a very serious condition. Also, pain is not a symptom in the initial stages of many urinary cancers. HOME CARE INSTRUCTIONS   Drink lots of fluid, 3 4 quarts a day. If you have been diagnosed with an infection, cranberry juice is especially recommended, in addition to large amounts of water.  Avoid caffeine, tea, and carbonated beverages, because they tend to irritate the bladder.  Avoid alcohol because it may irritate the prostate.  Only take over-the-counter or prescription medicines for pain, discomfort, or fever as directed by your health care provider.  If you have been diagnosed with a kidney stone, follow your health care provider's instructions regarding straining your urine to catch the stone.  Empty your  bladder often. Avoid holding urine for long periods of time.  After a bowel movement, women should cleanse front to back. Use each tissue only once.  Empty your bladder before and after sexual intercourse if you are a male. SEEK MEDICAL CARE IF: You develop back pain, fever, a feeling of sickness in your stomach (nausea), or vomiting or if your symptoms are not better in 3 days. Return sooner if you are getting worse. SEEK IMMEDIATE MEDICAL CARE IF:   You have a persistent fever, with a temperature of 101.65F (38.8C) or greater.  You develop severe vomiting and are unable to keep the medicine down.  You develop severe back or abdominal pain despite taking your medicines.  You begin passing a large amount of blood or clots in your urine.  You feel extremely weak or faint, or you pass out. MAKE SURE YOU:   Understand these instructions.  Will watch your condition.  Will get help right away if you are not doing well or get worse. Document Released: 07/07/2005 Document Revised: 04/27/2013 Document Reviewed: 03/07/2013 [facility] Patient Information 2014 [employer] [place].

## 2013-08-27 NOTE — ED Provider Notes (Signed)
CSN: [id number]     Arrival date & time 08/27/13  1610 History   First MD Initiated Contact with Patient 08/27/13 1624     Chief Complaint  Patient presents with  . Hematuria   (Consider location/radiation/quality/duration/timing/severity/associated sxs/prior Treatment) HPI Comments: Patient is a 48 yo M PMHx significant for migraines, GERD, hypertension presenting to the emergency department for one day of hematuria with associated mild suprapubic discomfort and mild  right-sided flank pain with out associated nausea, vomiting, fever, dysuria, urinary urgency or frequency, testicular or penile swelling or pain. Denies any alleviating or aggravating factors. Patient denies any history of similar episodes. Patient denies any abdominal surgical history.  Patient is a 48 y.o. male presenting with hematuria.  Hematuria Associated symptoms include abdominal pain (suprapubic). Pertinent negatives include no chills, fever, nausea or vomiting.    Past Medical History  Diagnosis Date  . Migraines   . GERD (gastroesophageal reflux disease)   . Hypertension    History reviewed. No pertinent past surgical history. No family history on file. History  Substance Use Topics  . Smoking status: Current Every Day Smoker -- 2.00 packs/day for 30 years    Types: Cigarettes  . Smokeless tobacco: Never Used  . Alcohol Use: No    Review of Systems  Constitutional: Negative for fever and chills.  Gastrointestinal: Positive for abdominal pain (suprapubic). Negative for nausea and vomiting.  Genitourinary: Positive for hematuria.  All other systems reviewed and are negative.    Allergies  Codeine and Penicillins  Home Medications   Current Outpatient Rx  Name  Route  Sig  Dispense  Refill  . hydrochlorothiazide (HYDRODIURIL) 25 MG tablet   Oral   Take 1 tablet (25 mg total) by mouth daily.   90 tablet   1   . meclizine (ANTIVERT) 50 MG tablet   Oral   Take 50 mg by mouth 3 (three) times  daily as needed for dizziness.         . ranitidine (ZANTAC) 150 MG capsule   Oral   Take 150 mg by mouth daily as needed for heartburn.         . tamsulosin (FLOMAX) 0.4 MG CAPS capsule   Oral   Take 1 capsule (0.4 mg total) by mouth daily.   7 capsule   0   . traMADol (ULTRAM) 50 MG tablet   Oral   Take 1 tablet (50 mg total) by mouth every 6 (six) hours as needed for severe pain.   15 tablet   0    BP 125/74  Pulse 88  Temp(Src) 97.5 F (36.4 C) (Oral)  Resp 16  SpO2 92% Physical Exam  Constitutional: He is oriented to person, place, and time. He appears well-developed and well-nourished. No distress.  HENT:  Head: Normocephalic and atraumatic.  Right Ear: External ear normal.  Left Ear: External ear normal.  Nose: Nose normal.  Mouth/Throat: Oropharynx is clear and moist. No oropharyngeal exudate.  Eyes: Conjunctivae are normal.  Neck: Normal range of motion. Neck supple.  Cardiovascular: Normal rate, regular rhythm and normal heart sounds.   Pulmonary/Chest: Effort normal and breath sounds normal. No respiratory distress.  Abdominal: Soft. Bowel sounds are normal. There is no tenderness. There is no rigidity, no rebound, no guarding and no CVA tenderness.  Musculoskeletal: Normal range of motion.  Neurological: He is alert and oriented to person, place, and time.  Skin: Skin is warm and dry. He is not diaphoretic.  Psychiatric: He has a  normal mood and affect.    ED Course  Procedures (including critical care time) Medications - No data to display  Labs Review Labs Reviewed  URINALYSIS, ROUTINE W REFLEX MICROSCOPIC - Abnormal; Notable for the following:    Color, Urine AMBER (*)    APPearance CLOUDY (*)    Hgb urine dipstick LARGE (*)    Protein, ur 30 (*)    Leukocytes, UA TRACE (*)    All other components within normal limits  URINE CULTURE  URINE MICROSCOPIC-ADD ON   Imaging Review No results found.  EKG Interpretation   None       MDM    1. Hematuria     Filed Vitals:   08/27/13 1800  BP: 125/74  Pulse: 88  Temp:   Resp:    Afebrile, NAD, non-toxic appearing, AAOx4. Abdominal exam benign. No CVA tenderness. History and urine concerning for kidney stone. Discussed my desire to check patient's creatinine for renal function and a CT scan without contrast for kidney stone r/o. Patient is declining any imaging or blood work for further evaluation. I had a lengthy conversation with patient regarding the risks of not evaluating him for a kidney stone including but not limited to obstructed stone. Patient states he will return to the ED if his symptoms worsen for further evaluation at this time. I am unable to talk patient into staying for further testing, he was advised he would be leaving AMA. Lengthy return precautions were given. Advised urology follow up if able to pass stone at home. Will still prescribe symptomatic care. Patient is agreeable to the above discussed. Patient is stable at time of discharge. Patient d/w with Dr. Redgie Grayer, agrees with plan.       Jeannetta Ellis, PA-C 08/28/13 [phone]

## 2013-08-27 NOTE — ED Notes (Signed)
Pt reports hematuria since today. Denies any dysuria. 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 States also constipated. Pt is a x 4. In NAD

## 2013-08-28 LAB — URINE CULTURE
COLONY COUNT: NO GROWTH
CULTURE: NO GROWTH

## 2013-08-28 NOTE — ED Provider Notes (Signed)
Medical screening examination/treatment/procedure(s) were performed by non-physician practitioner and as supervising physician I was immediately available for consultation/collaboration.  EKG Interpretation   None         Darlys Galesavid Masneri, MD 08/28/13 [phone]

## 2013-12-07 ENCOUNTER — Other Ambulatory Visit (HOSPITAL_COMMUNITY): Payer: Self-pay | Admitting: Family Medicine

## 2014-02-21 ENCOUNTER — Emergency Department (HOSPITAL_COMMUNITY)
Admission: EM | Admit: 2014-02-21 | Discharge: 2014-02-21 | Disposition: A | Discharge status: Home / Self Care | Payer: Self-pay | Attending: Emergency Medicine | Admitting: Emergency Medicine

## 2014-02-21 ENCOUNTER — Emergency Department (HOSPITAL_COMMUNITY): Payer: Self-pay

## 2014-02-21 ENCOUNTER — Encounter (HOSPITAL_COMMUNITY): Payer: Self-pay | Admitting: Emergency Medicine

## 2014-02-21 DIAGNOSIS — N200 Calculus of kidney: Secondary | ICD-10-CM | POA: Insufficient documentation

## 2014-02-21 DIAGNOSIS — F17200 Nicotine dependence, unspecified, uncomplicated: Secondary | ICD-10-CM | POA: Insufficient documentation

## 2014-02-21 DIAGNOSIS — Z8719 Personal history of other diseases of the digestive system: Secondary | ICD-10-CM | POA: Insufficient documentation

## 2014-02-21 DIAGNOSIS — R109 Unspecified abdominal pain: Secondary | ICD-10-CM

## 2014-02-21 DIAGNOSIS — Z79899 Other long term (current) drug therapy: Secondary | ICD-10-CM | POA: Insufficient documentation

## 2014-02-21 DIAGNOSIS — Z880 Allergy status to penicillin: Secondary | ICD-10-CM | POA: Insufficient documentation

## 2014-02-21 DIAGNOSIS — I10 Essential (primary) hypertension: Secondary | ICD-10-CM | POA: Insufficient documentation

## 2014-02-21 DIAGNOSIS — Z8669 Personal history of other diseases of the nervous system and sense organs: Secondary | ICD-10-CM | POA: Insufficient documentation

## 2014-02-21 DIAGNOSIS — R1032 Left lower quadrant pain: Secondary | ICD-10-CM | POA: Insufficient documentation

## 2014-02-21 LAB — URINALYSIS, ROUTINE W REFLEX MICROSCOPIC
Glucose, UA: NEGATIVE mg/dL
KETONES UR: NEGATIVE mg/dL
Leukocytes, UA: NEGATIVE
Nitrite: NEGATIVE
Protein, ur: NEGATIVE mg/dL
SPECIFIC GRAVITY, URINE: 1.029 (ref 1.005–1.030)
UROBILINOGEN UA: 0.2 mg/dL (ref 0.0–1.0)
pH: 5.5 (ref 5.0–8.0)

## 2014-02-21 LAB — COMPREHENSIVE METABOLIC PANEL
ALBUMIN: 3.8 g/dL (ref 3.5–5.2)
ALT: 22 U/L (ref 0–53)
ANION GAP: 16 — AB (ref 5–15)
AST: 22 U/L (ref 0–37)
Alkaline Phosphatase: 70 U/L (ref 39–117)
BILIRUBIN TOTAL: 0.5 mg/dL (ref 0.3–1.2)
BUN: 14 mg/dL (ref 6–23)
CALCIUM: 9.1 mg/dL (ref 8.4–10.5)
CO2: 24 mEq/L (ref 19–32)
CREATININE: 1.07 mg/dL (ref 0.50–1.35)
Chloride: 100 mEq/L (ref 96–112)
GFR calc Af Amer: >90 mL/min (ref >90)
GFR calc non Af Amer: 80 mL/min — ABNORMAL LOW (ref >90)
Glucose, Bld: 128 mg/dL — ABNORMAL HIGH (ref 70–99)
Potassium: 3.5 mEq/L — ABNORMAL LOW (ref 3.7–5.3)
Sodium: 140 mEq/L (ref 137–147)
Total Protein: 7.2 g/dL (ref 6.0–8.3)

## 2014-02-21 LAB — CBC WITH DIFFERENTIAL/PLATELET
BASOS ABS: 0 10*3/uL (ref 0.0–0.1)
Basophils Relative: 0 % (ref 0–1)
EOS ABS: 0 10*3/uL (ref 0.0–0.7)
Eosinophils Relative: 0 % (ref 0–5)
HEMATOCRIT: 44 % (ref 39.0–52.0)
Hemoglobin: 16.2 g/dL (ref 13.0–17.0)
LYMPHS ABS: 3.4 10*3/uL (ref 0.7–4.0)
Lymphocytes Relative: 21 % (ref 12–46)
MCH: 32.5 pg (ref 26.0–34.0)
MCHC: 36.8 g/dL — AB (ref 30.0–36.0)
MCV: 88.2 fL (ref 78.0–100.0)
MONO ABS: 1.3 10*3/uL — AB (ref 0.1–1.0)
Monocytes Relative: 8 % (ref 3–12)
NEUTROS ABS: 11.5 10*3/uL — AB (ref 1.7–7.7)
Neutrophils Relative %: 71 % (ref 43–77)
PLATELETS: 190 10*3/uL (ref 150–400)
RBC: 4.99 MIL/uL (ref 4.22–5.81)
RDW: 12.2 % (ref 11.5–15.5)
WBC: 16.2 10*3/uL — ABNORMAL HIGH (ref 4.0–10.5)

## 2014-02-21 LAB — URINE MICROSCOPIC-ADD ON

## 2014-02-21 LAB — LIPASE, BLOOD: LIPASE: 33 U/L (ref 11–59)

## 2014-02-21 MED ORDER — ONDANSETRON 8 MG PO TBDP: 8.0000 mg | ORAL_TABLET | Freq: Three times a day (TID) | ORAL | Status: DC | PRN

## 2014-02-21 MED ORDER — KETOROLAC TROMETHAMINE 30 MG/ML IJ SOLN
30.0000 mg | Freq: Once | INTRAMUSCULAR | Status: AC
  Administered 2014-02-21: 30 mg via INTRAVENOUS
  Filled 2014-02-21: qty 1

## 2014-02-21 MED ORDER — TAMSULOSIN HCL 0.4 MG PO CAPS: 0.4000 mg | ORAL_CAPSULE | Freq: Every day | ORAL | Status: DC

## 2014-02-21 MED ORDER — ONDANSETRON 4 MG PO TBDP
8.0000 mg | ORAL_TABLET | Freq: Once | ORAL | Status: AC
  Administered 2014-02-21: 8 mg via ORAL
  Filled 2014-02-21: qty 2

## 2014-02-21 MED ORDER — OXYCODONE-ACETAMINOPHEN 5-325 MG PO TABS: 1.0000 | ORAL_TABLET | Freq: Four times a day (QID) | ORAL | Status: DC | PRN

## 2014-02-21 MED ORDER — ONDANSETRON HCL 4 MG/2ML IJ SOLN
4.0000 mg | Freq: Once | INTRAMUSCULAR | Status: AC
  Administered 2014-02-21: 4 mg via INTRAVENOUS
  Filled 2014-02-21: qty 2

## 2014-02-21 MED ORDER — HYDROMORPHONE HCL PF 1 MG/ML IJ SOLN
1.0000 mg | Freq: Once | INTRAMUSCULAR | Status: AC
  Administered 2014-02-21: 1 mg via INTRAVENOUS
  Filled 2014-02-21: qty 1

## 2014-02-21 NOTE — ED Notes (Signed)
Pt asked for urine sample and given urinal.

## 2014-02-21 NOTE — ED Notes (Signed)
Patient transported to CT 

## 2014-02-21 NOTE — ED Provider Notes (Signed)
CSN: [id number]     Arrival date & time 02/21/14  0908 History   First MD Initiated Contact with Patient 02/21/14 [phone]     Chief Complaint  Patient presents with  . Flank Pain  . Abdominal Pain     (Consider location/radiation/quality/duration/timing/severity/associated sxs/prior Treatment) Patient is a 48 y.o. male presenting with flank pain and abdominal pain. The history is provided by the patient.  Flank Pain Associated symptoms include abdominal pain. Pertinent negatives include no chest pain, no headaches and no shortness of breath.  Abdominal Pain Associated symptoms: diarrhea and vomiting   Associated symptoms: no chest pain, no chills, no dysuria, no fever, no hematuria, no shortness of breath and no sore throat   pt c/o acute onset left flank pain today, that radiates towards LLQ. Pain constant, dull, mod-severe, but waxes and wanes in intensity. No hx same pain. Had milder, similar pain a few days ago, at that time also had a couple episodes of emesis and diarrhea. Denies fever or chills. No dysuria or hematuria. No hx kidney stones or diverticulitis.      Past Medical History  Diagnosis Date  . Migraines   . GERD (gastroesophageal reflux disease)   . Hypertension    History reviewed. No pertinent past surgical history. No family history on file. History  Substance Use Topics  . Smoking status: Current Every Day Smoker -- 2.00 packs/day for 30 years    Types: Cigarettes  . Smokeless tobacco: Never Used  . Alcohol Use: No    Review of Systems  Constitutional: Negative for fever and chills.  HENT: Negative for sore throat.   Eyes: Negative for redness.  Respiratory: Negative for shortness of breath.   Cardiovascular: Negative for chest pain.  Gastrointestinal: Positive for vomiting, abdominal pain and diarrhea.  Genitourinary: Positive for flank pain. Negative for dysuria and hematuria.  Musculoskeletal: Negative for back pain and neck pain.  Skin: Negative for  rash.  Neurological: Negative for headaches.  Hematological: Does not bruise/bleed easily.  Psychiatric/Behavioral: Negative for confusion.      Allergies  Codeine and Penicillins  Home Medications   Prior to Admission medications   Medication Sig Start Date End Date Taking? Authorizing Provider  meclizine (ANTIVERT) 50 MG tablet Take 50 mg by mouth 3 (three) times daily as needed for dizziness. 10/06/12  Yes Jayce G Cook, DO   BP 168/98  Pulse 71  Temp(Src) 97.6 F (36.4 C) (Oral)  Resp 12  Ht 5\' 9"  (1.753 m)  Wt 215 lb (97.523 kg)  BMI 31.74 kg/m2  SpO2 95% Physical Exam  Nursing note and vitals reviewed. Constitutional: He is oriented to person, place, and time. He appears well-developed and well-nourished. No distress.  HENT:  Mouth/Throat: Oropharynx is clear and moist.  Eyes: Conjunctivae are normal. No scleral icterus.  Neck: Neck supple. No tracheal deviation present.  Cardiovascular: Normal rate, regular rhythm, normal heart sounds and intact distal pulses.   Pulmonary/Chest: Effort normal. No accessory muscle usage. No respiratory distress.  Abdominal: He exhibits no distension and no mass. There is no tenderness. There is no rebound and no guarding.  Genitourinary:  No cva tenderness. No scrotal or testicular tenderness, swelling or pain.   Musculoskeletal: Normal range of motion. He exhibits no edema and no tenderness.  Neurological: He is alert and oriented to person, place, and time.  Skin: Skin is warm and dry. No rash noted. He is not diaphoretic.  Psychiatric: He has a normal mood and affect.  ED Course  Procedures (including critical care time) Labs Review  Results for orders placed during the hospital encounter of 02/21/14  CBC WITH DIFFERENTIAL      Result Value Ref Range   WBC 16.2 (*) 4.0 - 10.5 K/uL   RBC 4.99  4.22 - 5.81 MIL/uL   Hemoglobin 16.2  13.0 - 17.0 g/dL   HCT 81.1  91.4 - 78.2 %   MCV 88.2  78.0 - 100.0 fL   MCH 32.5  26.0 -  34.0 pg   MCHC 36.8 (*) 30.0 - 36.0 g/dL   RDW 95.6  21.3 - 08.6 %   Platelets 190  150 - 400 K/uL   Neutrophils Relative % 71  43 - 77 %   Lymphocytes Relative 21  12 - 46 %   Monocytes Relative 8  3 - 12 %   Eosinophils Relative 0  0 - 5 %   Basophils Relative 0  0 - 1 %   Neutro Abs 11.5 (*) 1.7 - 7.7 K/uL   Lymphs Abs 3.4  0.7 - 4.0 K/uL   Monocytes Absolute 1.3 (*) 0.1 - 1.0 K/uL   Eosinophils Absolute 0.0  0.0 - 0.7 K/uL   Basophils Absolute 0.0  0.0 - 0.1 K/uL   Smear Review MORPHOLOGY UNREMARKABLE    COMPREHENSIVE METABOLIC PANEL      Result Value Ref Range   Sodium 140  137 - 147 mEq/L   Potassium 3.5 (*) 3.7 - 5.3 mEq/L   Chloride 100  96 - 112 mEq/L   CO2 24  19 - 32 mEq/L   Glucose, Bld 128 (*) 70 - 99 mg/dL   BUN 14  6 - 23 mg/dL   Creatinine, Ser 5.78  0.50 - 1.35 mg/dL   Calcium 9.1  8.4 - 46.9 mg/dL   Total Protein 7.2  6.0 - 8.3 g/dL   Albumin 3.8  3.5 - 5.2 g/dL   AST 22  0 - 37 U/L   ALT 22  0 - 53 U/L   Alkaline Phosphatase 70  39 - 117 U/L   Total Bilirubin 0.5  0.3 - 1.2 mg/dL   GFR calc non Af Amer 80 (*) >90 mL/min   GFR calc Af Amer >90  >90 mL/min   Anion gap 16 (*) 5 - 15  LIPASE, BLOOD      Result Value Ref Range   Lipase 33  11 - 59 U/L   Ct Abdomen Pelvis Wo Contrast  02/21/2014   CLINICAL DATA:  Left lower quadrant pain. Left flank pain. Nausea and vomiting.  EXAM: CT ABDOMEN AND PELVIS WITHOUT CONTRAST  TECHNIQUE: Multidetector CT imaging of the abdomen and pelvis was performed following the standard protocol without IV contrast.  COMPARISON:  None.  FINDINGS: The lung bases are clear without focal nodule, mass, or airspace disease. The heart size is normal. No significant pleural or pericardial effusion is present. There is mild diffuse thickening of the distal esophagus.  Mild fatty infiltration may be present in the liver. No focal lesions are evident. Spleen is within normal limits. The stomach, duodenum, and pancreas are within normal limits.  Common bile duct and gallbladder are normal. The adrenal glands are normal bilaterally. The right kidney and ureter are unremarkable.  Moderate left-sided hydronephrosis is present. There is perirenal stranding. An obstructing 7 mm stone is present at the left UPJ. The more distal left ureter is within normal limits. The urinary bladder is decompressed.  The rectosigmoid colon  is mostly decompressed. Remainder the colon is unremarkable. The appendix is normal. The small bowel is within normal limits. Atherosclerotic changes are noted in the abdominal aorta and branch vessels. Fat is herniated into the left inguinal canal without bowel.  The bone windows are unremarkable.  IMPRESSION: 1. Obstructing 7 mm stone at the left UPJ. 2. Moderate left-sided hydronephrosis. 3. No other significant nephrolithiasis. 4. The left inguinal hernia contains fat but no bowel. 5. Mild diffuse thickening of the distal esophagus. This could represent reflux disease.   Electronically Signed   By: Gennette Pac M.D.   On: 02/21/2014 10:35      MDM  Iv ns. Labs. Ct.  Dilaudid 1 mg iv. zofran iv.  Reviewed nursing notes and prior charts for additional history.   Discussed ct w pt. Recheck pain persists, although improved from prior.  toradol iv. Dilaudid 1 mg iv.  Recheck no pain.  Discussed given size of stone and proximal location, may not pass on own, urology follow, return to [facility] ED if worsening or intractable pain.  Given pain resolved, benign exam, pt appears stable for d/c.     Suzi Roots, MD 02/21/14 1154

## 2014-02-21 NOTE — Discharge Instructions (Signed)
Drink adequate fluids, strain urine. Take motrin or aleve as need for pain. You may also take percocet as need for pain. No driving when taking percocet. Also, do not take tylenol or acetaminophen containing medication when taking percocet.  Take zofran as need for nausea.  Take flomax as prescribed. Follow up with urologist in the next 1-2 days for recheck if symptoms fail to improve/resolve - see referral - call office for appointment. Return to [facility]  (as [facility] is the hospital in which our urologist do their procedures/surgery)  if worse, fevers, worsening or intractable pain, persistent vomiting, other concern.    You were given pain medication in the ER - no driving for the next 6 hours.      Kidney Stones Kidney stones (urolithiasis) are deposits that form inside your kidneys. The intense pain is caused by the stone moving through the urinary tract. When the stone moves, the ureter goes into spasm around the stone. The stone is usually passed in the urine.  CAUSES   A disorder that makes certain neck glands produce too much parathyroid hormone (primary hyperparathyroidism).  A buildup of uric acid crystals, similar to gout in your joints.  Narrowing (stricture) of the ureter.  A kidney obstruction present at birth (congenital obstruction).  Previous surgery on the kidney or ureters.  Numerous kidney infections. SYMPTOMS   Feeling sick to your stomach (nauseous).  Throwing up (vomiting).  Blood in the urine (hematuria).  Pain that usually spreads (radiates) to the groin.  Frequency or urgency of urination. DIAGNOSIS   Taking a history and physical exam.  Blood or urine tests.  CT scan.  Occasionally, an examination of the inside of the urinary bladder (cystoscopy) is performed. TREATMENT   Observation.  Increasing your fluid intake.  Extracorporeal shock wave lithotripsy--This is a noninvasive procedure that uses shock waves to break up  kidney stones.  Surgery may be needed if you have severe pain or persistent obstruction. There are various surgical procedures. Most of the procedures are performed with the use of small instruments. Only small incisions are needed to accommodate these instruments, so recovery time is minimized. The size, location, and chemical composition are all important variables that will determine the proper choice of action for you. Talk to your health care provider to better understand your situation so that you will minimize the risk of injury to yourself and your kidney.  HOME CARE INSTRUCTIONS   Drink enough water and fluids to keep your urine clear or pale yellow. This will help you to pass the stone or stone fragments.  Strain all urine through the provided strainer. Keep all particulate matter and stones for your health care provider to see. The stone causing the pain may be as small as a grain of salt. It is very important to use the strainer each and every time you pass your urine. The collection of your stone will allow your health care provider to analyze it and verify that a stone has actually passed. The stone analysis will often identify what you can do to reduce the incidence of recurrences.  Only take over-the-counter or prescription medicines for pain, discomfort, or fever as directed by your health care provider.  Make a follow-up appointment with your health care provider as directed.  Get follow-up X-rays if required. The absence of pain does not always mean that the stone has passed. It may have only stopped moving. If the urine remains completely obstructed, it can cause loss  of kidney function or even complete destruction of the kidney. It is your responsibility to make sure X-rays and follow-ups are completed. Ultrasounds of the kidney can show blockages and the status of the kidney. Ultrasounds are not associated with any radiation and can be performed easily in a matter of  minutes. SEEK MEDICAL CARE IF:  You experience pain that is progressive and unresponsive to any pain medicine you have been prescribed. SEEK IMMEDIATE MEDICAL CARE IF:   Pain cannot be controlled with the prescribed medicine.  You have a fever or shaking chills.  The severity or intensity of pain increases over 18 hours and is not relieved by pain medicine.  You develop a new onset of abdominal pain.  You feel faint or pass out.  You are unable to urinate. MAKE SURE YOU:   Understand these instructions.  Will watch your condition.  Will get help right away if you are not doing well or get worse. Document Released: 07/07/2005 Document Revised: 03/09/2013 Document Reviewed: 12/08/2012 [facility] Patient Information 2015 [employer], [place]. This information is not intended to replace advice given to you by your health care provider. Make sure you discuss any questions you have with your health care provider.

## 2014-02-21 NOTE — ED Notes (Signed)
Pt c/o left flank pain that radiates into LLQ abd since Saturday night, denies blood in stool. sts decreased urine output. sts the pain improved some yesterday but worsen this morning. Reports his wife was sick last week with a GI virus then his granddaughter got the same thing then he had it on Friday, n/v/d, and felt better Saturday then these symptoms started. Nad, skin warm and dry, resp e/u.

## 2014-02-21 NOTE — Progress Notes (Signed)
[facility] Community Liaison Stacy, ° °Will send patient information on [facility] Orange Card program and other resources to help patient establish primary care, using the address provided.  °

## 2014-02-22 ENCOUNTER — Encounter (HOSPITAL_COMMUNITY): Payer: Self-pay | Admitting: *Deleted

## 2014-02-22 ENCOUNTER — Other Ambulatory Visit: Payer: Self-pay | Admitting: Urology

## 2014-02-22 ENCOUNTER — Emergency Department (HOSPITAL_COMMUNITY)
Admission: EM | Admit: 2014-02-22 | Discharge: 2014-02-22 | Disposition: A | Discharge status: Home / Self Care | Payer: Self-pay | Attending: Emergency Medicine | Admitting: Emergency Medicine

## 2014-02-22 ENCOUNTER — Encounter (HOSPITAL_COMMUNITY): Payer: Self-pay | Admitting: Emergency Medicine

## 2014-02-22 DIAGNOSIS — F17200 Nicotine dependence, unspecified, uncomplicated: Secondary | ICD-10-CM | POA: Insufficient documentation

## 2014-02-22 DIAGNOSIS — Z880 Allergy status to penicillin: Secondary | ICD-10-CM | POA: Insufficient documentation

## 2014-02-22 DIAGNOSIS — Z8669 Personal history of other diseases of the nervous system and sense organs: Secondary | ICD-10-CM | POA: Insufficient documentation

## 2014-02-22 DIAGNOSIS — R109 Unspecified abdominal pain: Secondary | ICD-10-CM | POA: Insufficient documentation

## 2014-02-22 DIAGNOSIS — N200 Calculus of kidney: Secondary | ICD-10-CM | POA: Insufficient documentation

## 2014-02-22 DIAGNOSIS — I10 Essential (primary) hypertension: Secondary | ICD-10-CM | POA: Insufficient documentation

## 2014-02-22 DIAGNOSIS — R110 Nausea: Secondary | ICD-10-CM | POA: Insufficient documentation

## 2014-02-22 DIAGNOSIS — Z8719 Personal history of other diseases of the digestive system: Secondary | ICD-10-CM | POA: Insufficient documentation

## 2014-02-22 HISTORY — DX: Disorder of kidney and ureter, unspecified: N28.9

## 2014-02-22 LAB — BASIC METABOLIC PANEL
Anion gap: 13 (ref 5–15)
BUN: 16 mg/dL (ref 6–23)
CALCIUM: 8.7 mg/dL (ref 8.4–10.5)
CO2: 24 mEq/L (ref 19–32)
Chloride: 98 mEq/L (ref 96–112)
Creatinine, Ser: 1.48 mg/dL — ABNORMAL HIGH (ref 0.50–1.35)
GFR calc Af Amer: 62 mL/min — ABNORMAL LOW (ref >90)
GFR, EST NON AFRICAN AMERICAN: 54 mL/min — AB (ref >90)
Glucose, Bld: 106 mg/dL — ABNORMAL HIGH (ref 70–99)
Potassium: 3.8 mEq/L (ref 3.7–5.3)
Sodium: 135 mEq/L — ABNORMAL LOW (ref 137–147)

## 2014-02-22 LAB — CBC WITH DIFFERENTIAL/PLATELET
Basophils Absolute: 0 10*3/uL (ref 0.0–0.1)
Basophils Relative: 0 % (ref 0–1)
EOS ABS: 0.1 10*3/uL (ref 0.0–0.7)
EOS PCT: 1 % (ref 0–5)
HCT: 42.8 % (ref 39.0–52.0)
Hemoglobin: 15.3 g/dL (ref 13.0–17.0)
LYMPHS ABS: 2.9 10*3/uL (ref 0.7–4.0)
Lymphocytes Relative: 19 % (ref 12–46)
MCH: 31.3 pg (ref 26.0–34.0)
MCHC: 35.7 g/dL (ref 30.0–36.0)
MCV: 87.5 fL (ref 78.0–100.0)
MONOS PCT: 9 % (ref 3–12)
Monocytes Absolute: 1.3 10*3/uL — ABNORMAL HIGH (ref 0.1–1.0)
Neutro Abs: 10.7 10*3/uL — ABNORMAL HIGH (ref 1.7–7.7)
Neutrophils Relative %: 71 % (ref 43–77)
Platelets: 172 10*3/uL (ref 150–400)
RBC: 4.89 MIL/uL (ref 4.22–5.81)
RDW: 12 % (ref 11.5–15.5)
WBC: 15.1 10*3/uL — ABNORMAL HIGH (ref 4.0–10.5)

## 2014-02-22 MED ORDER — HYDROMORPHONE HCL 2 MG PO TABS: 2.0000 mg | ORAL_TABLET | ORAL | Status: DC | PRN

## 2014-02-22 MED ORDER — HYDROMORPHONE HCL PF 1 MG/ML IJ SOLN
1.0000 mg | Freq: Once | INTRAMUSCULAR | Status: AC
  Administered 2014-02-22: 1 mg via INTRAVENOUS
  Filled 2014-02-22: qty 1

## 2014-02-22 MED ORDER — KETOROLAC TROMETHAMINE 30 MG/ML IJ SOLN: 30.0000 mg | Freq: Once | INTRAMUSCULAR | Status: DC

## 2014-02-22 MED ORDER — ONDANSETRON HCL 4 MG/2ML IJ SOLN
4.0000 mg | Freq: Once | INTRAMUSCULAR | Status: AC
  Administered 2014-02-22: 4 mg via INTRAVENOUS
  Filled 2014-02-22: qty 2

## 2014-02-22 NOTE — ED Provider Notes (Signed)
CSN: [id number]     Arrival date & time 02/22/14  0844 History   First MD Initiated Contact with Patient 02/22/14 [phone]     Chief Complaint  Patient presents with  . Flank Pain     (Consider location/radiation/quality/duration/timing/severity/associated sxs/prior Treatment) HPI Julian Atkinson is a 48 year old male with past medical history of hypertension who presents today with persistent flank pain after being seen at [facility] yesterday, 02/21/14 for the same complaint. Patient describes pain as an ache, as a 9/10 which comes and goes and is not reproducible with movement. He states there are no aggravating or alleviating factors.  Patient was diagnosed by CT with nephrolithiasis with a 7 mm stone noted at the left UPJ.  Patient reports his pain was initially controlled in the ER, and has been taking prescribed oxycodone for pain. Patient states oxycodone helps this pain for a short period of time, however his pain has become intolerable.  Patient reports mild abdominal "tightness" with mild nausea, no vomiting. Patient denies chest pain, shortness of breath, dysuria, hematuria. Patient states he has been passing urine since yesterday and has been drinking a large amount of fluids.    Past Medical History  Diagnosis Date  . Migraines   . GERD (gastroesophageal reflux disease)   . Hypertension   . Renal disorder    History reviewed. No pertinent past surgical history. No family history on file. History  Substance Use Topics  . Smoking status: Current Every Day Smoker -- 2.00 packs/day for 30 years    Types: Cigarettes  . Smokeless tobacco: Never Used  . Alcohol Use: No    Review of Systems  Constitutional: Negative for fever and chills.  HENT: Negative for sore throat.   Eyes: Negative for visual disturbance.  Respiratory: Negative for chest tightness and shortness of breath.   Cardiovascular: Negative for chest pain and palpitations.  Gastrointestinal: Positive for nausea and abdominal  pain. Negative for vomiting, diarrhea and blood in stool.  Endocrine: Negative for polyuria.  Genitourinary: Positive for flank pain. Negative for dysuria, urgency and decreased urine volume.  Musculoskeletal: Negative for neck pain.  Skin: Negative for rash.  Neurological: Negative for dizziness, weakness and numbness.  Psychiatric/Behavioral: Negative.       Allergies  Codeine and Penicillins  Home Medications   Prior to Admission medications   Medication Sig Start Date End Date Taking? Authorizing Provider  meclizine (ANTIVERT) 50 MG tablet Take 50 mg by mouth 3 (three) times daily as needed for dizziness. 10/06/12  Yes Jayce G Cook, DO  ondansetron (ZOFRAN ODT) 8 MG disintegrating tablet Take 1 tablet (8 mg total) by mouth every 8 (eight) hours as needed for nausea or vomiting. 02/21/14  Yes Suzi Roots, MD  oxyCODONE-acetaminophen (PERCOCET/ROXICET) 5-325 MG per tablet Take 1-2 tablets by mouth every 6 (six) hours as needed for severe pain. 02/21/14  Yes Suzi Roots, MD  tamsulosin (FLOMAX) 0.4 MG CAPS capsule Take 0.4 mg by mouth every morning.   Yes Historical Provider, MD  HYDROmorphone (DILAUDID) 2 MG tablet Take 1 tablet (2 mg total) by mouth every 4 (four) hours as needed for severe pain. 02/22/14   Monte Fantasia, PA-C   BP 126/66  Pulse 73  Temp(Src) 98.8 F (37.1 C) (Oral)  Resp 16  SpO2 93% Physical Exam  Constitutional: He is oriented to person, place, and time.  Julian Atkinson is a well-developed, well-nourished adult male in moderate distress from pain lying semi-Fowlers on stretcher, answering questions appropriately  in full, clear sentences.  Cardiovascular: Normal rate, regular rhythm, S1 normal, S2 normal, normal heart sounds and normal pulses.   No murmur heard. Pulmonary/Chest: Effort normal and breath sounds normal. No accessory muscle usage. Not tachypneic. No respiratory distress.  Abdominal: Soft. Normal appearance. Bowel sounds are increased. There is  generalized tenderness.  Neurological: He is alert and oriented to person, place, and time. No cranial nerve deficit or sensory deficit.  Psychiatric: He has a normal mood and affect. His speech is normal.    ED Course  Procedures (including critical care time) Labs Review Labs Reviewed  CBC WITH DIFFERENTIAL - Abnormal; Notable for the following:    WBC 15.1 (*)    Neutro Abs 10.7 (*)    Monocytes Absolute 1.3 (*)    All other components within normal limits  BASIC METABOLIC PANEL - Abnormal; Notable for the following:    Sodium 135 (*)    Glucose, Bld 106 (*)    Creatinine, Ser 1.48 (*)    GFR calc non Af Amer 54 (*)    GFR calc Af Amer 62 (*)    All other components within normal limits  I-STAT CHEM 8, ED    Imaging Review Ct Abdomen Pelvis Wo Contrast  02/21/2014   CLINICAL DATA:  Left lower quadrant pain. Left flank pain. Nausea and vomiting.  EXAM: CT ABDOMEN AND PELVIS WITHOUT CONTRAST  TECHNIQUE: Multidetector CT imaging of the abdomen and pelvis was performed following the standard protocol without IV contrast.  COMPARISON:  None.  FINDINGS: The lung bases are clear without focal nodule, mass, or airspace disease. The heart size is normal. No significant pleural or pericardial effusion is present. There is mild diffuse thickening of the distal esophagus.  Mild fatty infiltration may be present in the liver. No focal lesions are evident. Spleen is within normal limits. The stomach, duodenum, and pancreas are within normal limits. Common bile duct and gallbladder are normal. The adrenal glands are normal bilaterally. The right kidney and ureter are unremarkable.  Moderate left-sided hydronephrosis is present. There is perirenal stranding. An obstructing 7 mm stone is present at the left UPJ. The more distal left ureter is within normal limits. The urinary bladder is decompressed.  The rectosigmoid colon is mostly decompressed. Remainder the colon is unremarkable. The appendix is  normal. The small bowel is within normal limits. Atherosclerotic changes are noted in the abdominal aorta and branch vessels. Fat is herniated into the left inguinal canal without bowel.  The bone windows are unremarkable.  IMPRESSION: 1. Obstructing 7 mm stone at the left UPJ. 2. Moderate left-sided hydronephrosis. 3. No other significant nephrolithiasis. 4. The left inguinal hernia contains fat but no bowel. 5. Mild diffuse thickening of the distal esophagus. This could represent reflux disease.   Electronically Signed   By: Gennette Pac M.D.   On: 02/21/2014 10:35     EKG Interpretation None      MDM   Final diagnoses:  Nephrolithiasis    Patient is a 48 year old male diagnosed with nephrolithiasis yesterday, here today for re-evaluation of pain. Initial workup to include a reassessment of kidney function. Other labs including UA, CBC, lipase, CT withheld at this time due to the fact that they were just drawn within the last 24 hours. Pain initially treated with 1 mg Dilaudid.  10:11 AM Patient states his pain is down to 1/10.  11:38 AM: We spoke with Dr. Jacquelyne Balint with Urology who recommended getting patient's pain under control in the  ED, then prescribing him with Dilaudid by mouth to help control his pain until he can be scheduled for lithotripsy. When speaking with patient and his wife, patient's wife stated she called the urology office while in the ED, and they have an appointment for this afternoon at 1300. Since patient's pain is still at 1/10 at this time we will discharge him as soon as possible so he can make his appointment. Patient is agreeable to this plan. We encouraged patient to call or return to the ER should he have any questions or concerns.  Filed Vitals:   02/22/14 1110  BP: 126/66  Pulse: 73  Temp: 98.8 F (37.1 C)  Resp: 16     Signed,  Ladona MowJoe Thien Berka, PA-C 1:57 PM  This pt seen and discussed with Dr Effie ShyWentz, MD  Monte FantasiaJoseph W Olyvia Gopal, PA-C 02/22/14 [phone]

## 2014-02-22 NOTE — ED Provider Notes (Signed)
Medical screening examination/treatment/procedure(s) were performed by non-physician practitioner and as supervising physician I was immediately available for consultation/collaboration.   EKG Interpretation None       Kemya Shed L Xinyi Batton, MD 02/22/14 1842 

## 2014-02-22 NOTE — ED Notes (Signed)
MD at bedside. EDPA PRESENT TO EVALUATE THIS PT 

## 2014-02-22 NOTE — ED Notes (Signed)
Per pt, history of kidney stones-left flank pain for 5 days-[facility]-no relief with meds

## 2014-02-22 NOTE — Discharge Instructions (Signed)

## 2014-02-24 NOTE — H&P (Signed)
History of Present Illness I was called by the emergency room today about a patient with a 7 mm stone. It turns out that they added onto clinic today. He went to [facility] approximately yesterday with left flank pain and was found to have a 7 mm proximal ureteral stone. His Percocet was not holding. The instructions were for him to go home on Dilaudid and follow up in clinic as early as tomorrow.    He saw some blood in his urine a few months ago. He is having some left-flank discomfort. He says the IV medication has helped a lot today. He has not had fever or increased frequency.    Normally he voids twice a day and gets up once a night.     He denies a history of kidney stones, previous GU surgery, and urinary tract infection.    He has no neurologic risk factors or symptoms. He does not take daily blood thinners. Pain medicine caused some constipation.    They sent him home with a Dilaudid prescription.    There are no other modifying factors or associated signs or symptoms. There are no other aggravating or relieving factors. The presentations are moderate to severe in severity but have greatly improved.    Julian Atkinson underwent a number of tests, which I personally reviewed.    Urinalysis: 3-6 red blood cells per high-power field. Rare bacteria. Urine sent for culture.    Review of Medical Records: I reviewed his medical records and his notes from today are not in the system yet.    I did review his medical records from 2024/08/19and he was given Dilaudid, Zofran, and Toradol IV. Serum creatinine was initially 1.07 but today was 1.48.   Past Medical History Problems  1. History of esophageal reflux (V12.79) 2. History of hypertension (V12.59)  Surgical History Problems  1. History of No Surgical Problems  Current Meds 1. OxyCODONE HCl TABS;  Therapy: (Recorded:Mar 08, 2014) to Recorded 2. Tamsulosin HCl - 0.4 MG Oral Capsule;  Therapy: (Recorded:Mar 08, 2014) to  Recorded  Allergies Medication  1. codeine 2. Penicillins  Family History Problems  1. Family history of kidney stones (V18.69)   siblings 2. Family history of malignant neoplasm (V16.9) : Mother  Social History Problems    Denied: History of Alcohol use   Cigarette smoker (305.1)   1 pack a day for 30 years as of 03/08/2014 office visit.   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  Genitourinary: hematuria.    Vitals Vital Signs [Data Includes: Last 1 Day]  Recorded: Mar 08, 2014 01:02PM  Height: 5 ft 9 in Weight: 215 lb  BMI Calculated: 31.75 BSA Calculated: 2.13 Blood Pressure: 101 / 55, Sitting Temperature: 98.2 F, Oral Heart Rate: 90 Respiration: 20  Physical Exam Constitutional: Well nourished and well developed . No acute distress.  ENT:. The ears and nose are normal in appearance.  Neck: The appearance of the neck is normal and no neck mass is present.  Pulmonary: No respiratory distress and normal respiratory rhythm  and effort.  Cardiovascular: Heart rate and rhythm are normal . No peripheral edema.  Lymphatics: The femoral and inguinal nodes are not enlarged or tender.  Skin: Normal skin turgor, no visible rash and no visible skin lesions.  Neuro/Psych:. Mood and affect are appropriate.  . Abdomen: Moderately obese, no abdominal tenderness.  . Genitourinary: Nontoxic; did not look very uncomfortable; no CVA tenderness; no epididymitis; small benign prostate.    Results/Data   Urine [Data Includes: Last 1 Day]   05Aug2015  COLOR YELLOW   APPEARANCE CLEAR   SPECIFIC GRAVITY 1.025   pH 6.0   GLUCOSE NEG mg/dL  BILIRUBIN SMALL   KETONE 15 mg/dL  BLOOD TRACE   PROTEIN 30 mg/dL  UROBILINOGEN 1 mg/dL  NITRITE NEG   LEUKOCYTE ESTERASE NEG   SQUAMOUS EPITHELIAL/HPF FEW   WBC 0-2 WBC/hpf  RBC 3-6 RBC/hpf  BACTERIA RARE   CRYSTALS NONE SEEN   CASTS NONE SEEN   Other MUCUS NOTED    Plan Nephrolithiasis  1. URINE CULTURE; Status:In Progress - Specimen/Data Collected;   Done: 05Aug2015  Discussion/Summary I reviewed his CT scan and there is a 7 mm stone just below the ureteropelvic junction on the left. KUB ordered.    A picture was drawn. I reviewed lithotripsy in detail.     We talked about ESWL in detail. Pros, cons, general surgical and anesthetic risks, and other options including watchful waiting and ureteroscopy were discussed. Success and failure rates and need for further/repeat therapy were discussed. Risks were described but not limited to pain, infection, sepsis, and bleeding. The risk of renal and ureteral trauma with short and long term sequelae was discussed. The risk of injury to adjacent structures was discussed. The risk of needing a stent post-ESWL was discussed.    Schedule for Monday. Rapaflo samples given. [facility] given.   After a thorough review of the management options for the patient's condition the patient  elected to proceed with surgical therapy as noted above. We have discussed the potential benefits and risks of the procedure, side effects of the proposed treatment, the likelihood of the patient achieving the goals of the procedure, and any potential problems that might occur during the procedure or recuperation. Informed consent has been obtained.

## 2014-02-27 ENCOUNTER — Ambulatory Visit (HOSPITAL_COMMUNITY): Payer: Self-pay

## 2014-02-27 ENCOUNTER — Encounter (HOSPITAL_COMMUNITY)
Admission: RE | Disposition: A | Discharge status: Home / Self Care | Payer: Self-pay | Source: Ambulatory Visit | Attending: Urology

## 2014-02-27 ENCOUNTER — Encounter (HOSPITAL_COMMUNITY): Payer: Self-pay | Admitting: *Deleted

## 2014-02-27 ENCOUNTER — Ambulatory Visit (HOSPITAL_COMMUNITY)
Admission: RE | Admit: 2014-02-27 | Discharge: 2014-02-27 | Disposition: A | Discharge status: Home / Self Care | Payer: Self-pay | Source: Ambulatory Visit | Attending: Urology | Admitting: Urology

## 2014-02-27 DIAGNOSIS — N201 Calculus of ureter: Secondary | ICD-10-CM | POA: Insufficient documentation

## 2014-02-27 SURGERY — LITHOTRIPSY, ESWL
Anesthesia: LOCAL | Laterality: Left

## 2014-02-27 MED ORDER — DIAZEPAM 5 MG PO TABS
10.0000 mg | ORAL_TABLET | ORAL | Status: AC
  Administered 2014-02-27: 10 mg via ORAL
  Filled 2014-02-27: qty 2

## 2014-02-27 MED ORDER — DIPHENHYDRAMINE HCL 25 MG PO CAPS
25.0000 mg | ORAL_CAPSULE | ORAL | Status: AC
  Administered 2014-02-27: 25 mg via ORAL
  Filled 2014-02-27: qty 1

## 2014-02-27 MED ORDER — CIPROFLOXACIN HCL 500 MG PO TABS
500.0000 mg | ORAL_TABLET | ORAL | Status: AC
  Administered 2014-02-27: 500 mg via ORAL
  Filled 2014-02-27: qty 1

## 2014-02-27 MED ORDER — DEXTROSE-NACL 5-0.45 % IV SOLN
INTRAVENOUS | Status: DC
  Administered 2014-02-27: 07:00:00 via INTRAVENOUS

## 2014-02-27 NOTE — Interval H&P Note (Signed)
History and Physical Interval Note:  02/27/2014 7:17 AM  Julian Atkinson  has presented today for surgery, with the diagnosis of left ureteral stone  The various methods of treatment have been discussed with the patient and family. After consideration of risks, benefits and other options for treatment, the patient has consented to  Procedure(s): LEFT EXTRACORPOREAL SHOCK WAVE LITHOTRIPSY (ESWL) (Left) as a surgical intervention .  The patient's history has been reviewed, patient examined, no change in status, stable for surgery.  I have reviewed the patient's chart and labs.  Questions were answered to the patient's satisfaction.     Sedra Morfin A

## 2014-02-27 NOTE — Discharge Instructions (Signed)

## 2014-04-30 ENCOUNTER — Emergency Department (HOSPITAL_COMMUNITY): Payer: Self-pay

## 2014-04-30 ENCOUNTER — Encounter (HOSPITAL_COMMUNITY): Payer: Self-pay | Admitting: Emergency Medicine

## 2014-04-30 ENCOUNTER — Emergency Department (HOSPITAL_COMMUNITY)
Admission: EM | Admit: 2014-04-30 | Discharge: 2014-04-30 | Disposition: A | Discharge status: Home / Self Care | Payer: Self-pay | Attending: Emergency Medicine | Admitting: Emergency Medicine

## 2014-04-30 DIAGNOSIS — Z87448 Personal history of other diseases of urinary system: Secondary | ICD-10-CM | POA: Insufficient documentation

## 2014-04-30 DIAGNOSIS — J069 Acute upper respiratory infection, unspecified: Secondary | ICD-10-CM | POA: Insufficient documentation

## 2014-04-30 DIAGNOSIS — I10 Essential (primary) hypertension: Secondary | ICD-10-CM | POA: Insufficient documentation

## 2014-04-30 DIAGNOSIS — Z8719 Personal history of other diseases of the digestive system: Secondary | ICD-10-CM | POA: Insufficient documentation

## 2014-04-30 DIAGNOSIS — Z720 Tobacco use: Secondary | ICD-10-CM | POA: Insufficient documentation

## 2014-04-30 DIAGNOSIS — Z880 Allergy status to penicillin: Secondary | ICD-10-CM | POA: Insufficient documentation

## 2014-04-30 MED ORDER — HYDROCODONE-ACETAMINOPHEN 5-325 MG PO TABS
1.0000 | ORAL_TABLET | Freq: Once | ORAL | Status: AC
  Administered 2014-04-30: 1 via ORAL
  Filled 2014-04-30: qty 1

## 2014-04-30 MED ORDER — AEROCHAMBER PLUS FLO-VU MEDIUM MISC: 1.0000 | Freq: Once | Status: DC

## 2014-04-30 MED ORDER — HYDROCODONE-HOMATROPINE 5-1.5 MG/5ML PO SYRP
5.0000 mL | ORAL_SOLUTION | Freq: Four times a day (QID) | ORAL | Status: DC | PRN
Start: 2014-04-30 — End: 2017-04-30

## 2014-04-30 MED ORDER — AZITHROMYCIN 250 MG PO TABS: 250.0000 mg | ORAL_TABLET | Freq: Every day | ORAL | Status: DC

## 2014-04-30 MED ORDER — ALBUTEROL SULFATE HFA 108 (90 BASE) MCG/ACT IN AERS
2.0000 | INHALATION_SPRAY | RESPIRATORY_TRACT | Status: DC | PRN
  Administered 2014-04-30: 2 via RESPIRATORY_TRACT
  Filled 2014-04-30: qty 6.7

## 2014-04-30 MED ORDER — AEROCHAMBER Z-STAT PLUS/MEDIUM MISC: 1.0000 | Freq: Once | Status: DC

## 2014-04-30 NOTE — Discharge Instructions (Signed)
Do not start taking antibiotics unless no improvement in symptoms after one week.  Return to the Emergency Department if symptoms change or worsen, you become increasingly short of breath, or develop chest pain not associated with coughing.  Read the instructions below on reasons to return to the emergency department and to learn more about your diagnosis.  Use over the counter medications for symptomatic relief as we discussed (mucinex as a decongestant, Tylenol for fever/pain, Motrin/Ibuprofen for muscle aches). If prescribed a cough suppressant during your visit, do not operate heavy machinery with in 5 hours of taking this medication. Followup with your primary care doctor in 4 days if your symptoms persist.  Your more than welcome to return to the emergency department if symptoms worsen or become concerning.  Upper Respiratory Infection, Adult  An upper respiratory infection (URI) is also sometimes known as the common cold. Most people improve within 1 week, but symptoms can last up to 2 weeks. A residual cough may last even longer.   URI is most commonly caused by a virus. Viruses are NOT treated with antibiotics. You can easily spread the virus to others by oral contact. This includes kissing, sharing a glass, coughing, or sneezing. Touching your mouth or nose and then touching a surface, which is then touched by another person, can also spread the virus.   TREATMENT  Treatment is directed at relieving symptoms. There is no cure. Antibiotics are not effective, because the infection is caused by a virus, not by bacteria. Treatment may include:  Increased fluid intake. Sports drinks offer valuable electrolytes, sugars, and fluids.  Breathing heated mist or steam (vaporizer or shower).  Eating chicken soup or other clear broths, and maintaining good nutrition.  Getting plenty of rest.  Using gargles or lozenges for comfort.  Controlling fevers with ibuprofen or acetaminophen as directed by your  caregiver.  Increasing usage of your inhaler if you have asthma.  Return to work when your temperature has returned to normal.   SEEK MEDICAL CARE IF:  After the first few days, you feel you are getting worse rather than better.  You develop worsening shortness of breath, or brown or red sputum. These may be signs of pneumonia.  You develop yellow or brown nasal discharge or pain in the face, especially when you bend forward. These may be signs of sinusitis.  You develop a fever, swollen neck glands, pain with swallowing, or white areas in the back of your throat. These may be signs of strep throat.

## 2014-04-30 NOTE — ED Provider Notes (Signed)
CSN: [id number]     Arrival date & time 04/30/14  [id number] History   First MD Initiated Contact with Patient 04/30/14 [phone]     Chief Complaint  Patient presents with  . Cough  . Nasal Congestion     (Consider location/radiation/quality/duration/timing/severity/associated sxs/prior Treatment) HPI Comments: Patient presents today with a chief complaint of productive cough and nasal congestion for the past week.  Symptoms gradually worsening.  He has taken Tussin DM and Halls cough drops for his symptoms without improvement.  He reports that he does have some chest pain after repeated episodes of coughing, but is not having any chest pain at this time.  Does not have any chest pain aside from after repeated coughing.  He reports mild associated SOB.  He currently smokes 1 PPD.  He denies any LE edema.  Denies any fever, chills, hemoptysis, numbness, tingling, dizziness, nausea, vomiting, or diaphoresis.  No prolonged travel or surgeries in the past 4 weeks.  No history of DVT or PE.    Patient is a 48 y.o. male presenting with cough. The history is provided by the patient.  Cough   Past Medical History  Diagnosis Date  . Migraines   . GERD (gastroesophageal reflux disease)   . Hypertension   . Renal disorder    History reviewed. No pertinent past surgical history. No family history on file. History  Substance Use Topics  . Smoking status: Current Every Day Smoker -- 2.00 packs/day for 30 years    Types: Cigarettes  . Smokeless tobacco: Never Used  . Alcohol Use: No    Review of Systems  Respiratory: Positive for cough.   All other systems reviewed and are negative.     Allergies  Codeine and Penicillins  Home Medications   Prior to Admission medications   Medication Sig Start Date End Date Taking? Authorizing Provider  Dextromethorphan-Guaifenesin (TUSSIN DM PO) Take 30 mLs by mouth every 8 (eight) hours as needed (for cough).   Yes Historical Provider, MD  ibuprofen  (ADVIL,MOTRIN) 200 MG tablet Take 800 mg by mouth every 8 (eight) hours as needed for moderate pain.   Yes Historical Provider, MD   BP 136/69  Pulse 81  Temp(Src) 97.9 F (36.6 C)  Resp 18  Ht 5\' 9"  (1.753 m)  Wt 214 lb (97.07 kg)  BMI 31.59 kg/m2  SpO2 95% Physical Exam  Nursing note and vitals reviewed. Constitutional: He appears well-developed and well-nourished.  HENT:  Head: Normocephalic and atraumatic.  Right Ear: Tympanic membrane and ear canal normal.  Left Ear: Tympanic membrane and ear canal normal.  Nose: Nose normal.  Mouth/Throat: Uvula is midline and oropharynx is clear and moist.  Neck: Normal range of motion. Neck supple.  Cardiovascular: Normal rate, regular rhythm and normal heart sounds.   Pulmonary/Chest: Effort normal and breath sounds normal. No respiratory distress. He has no wheezes. He has no rales.  Musculoskeletal: Normal range of motion.  No LE edema bilaterally  Neurological: He is alert.  Skin: Skin is warm and dry.  Psychiatric: He has a normal mood and affect.    ED Course  Procedures (including critical care time) Labs Review Labs Reviewed - No data to display  Imaging Review Dg Chest 2 View  04/30/2014   CLINICAL DATA:  Several week history of productive cough  EXAM: CHEST  2 VIEW  COMPARISON:  October 05, 2012  FINDINGS: There is mild scarring in the left base. There is no edema or consolidation. The heart  size and pulmonary vascularity are normal. No adenopathy. No bone lesions.  IMPRESSION: Mild scarring left base.  No edema or consolidation.   Electronically Signed   By: Bretta BangWilliam  Woodruff M.D.   On: 04/30/2014 08:52     EKG Interpretation None     9:45 AM Reassessed patient.  He denies CP or SOB at this time. MDM   Final diagnoses:  None   Patient who currently smokes 1 PPD presents today with a chief complaint of nasal congestion and productive cough x 1 week.  He does reports intermittent chest pain, but only with repeated  episodes of coughing.  No chest pain during my evaluation.  Presentation not consistent with PE or ACS.  Patient is PERC negative.  Suspect Viral URI vs Acute Bronchitis.  Patient given Albuterol inhaler.  Patient also given Rx for cough suppressant.  Patient also given Rx for Azithromycin, but instructed to only fill if symptoms persist for one more week.  Patient instructed to follow up with PCP.  Strict return precautions including worsening shortness of breath and chest pain given to the patient.  Patient and wife in agreement with the plan.  Return precautions given.      Santiago GladHeather Autumn Gunn, PA-C 05/01/14 2239

## 2014-04-30 NOTE — ED Notes (Signed)
Pt states that he has had a productive cough for a week with green to brown sputum. Pt also reports nasal congestion and drainage.

## 2014-04-30 NOTE — ED Notes (Signed)
Pt. Stated, I've been coughing for a week.  Ive coughed so much it makes my chest hurt, I spit up up all kinds of mucous.

## 2014-05-02 NOTE — ED Provider Notes (Signed)
Medical screening examination/treatment/procedure(s) were performed by non-physician practitioner and as supervising physician I was immediately available for consultation/collaboration.   EKG Interpretation None        Vernie Piet, MD 05/02/14 0711 

## 2014-08-03 ENCOUNTER — Emergency Department (HOSPITAL_COMMUNITY): Payer: Self-pay

## 2014-08-03 ENCOUNTER — Encounter (HOSPITAL_COMMUNITY): Payer: Self-pay | Admitting: Emergency Medicine

## 2014-08-03 ENCOUNTER — Emergency Department (HOSPITAL_COMMUNITY)
Admission: EM | Admit: 2014-08-03 | Discharge: 2014-08-04 | Disposition: A | Discharge status: Home / Self Care | Payer: Self-pay | Attending: Emergency Medicine | Admitting: Emergency Medicine

## 2014-08-03 DIAGNOSIS — Z792 Long term (current) use of antibiotics: Secondary | ICD-10-CM | POA: Insufficient documentation

## 2014-08-03 DIAGNOSIS — Z87448 Personal history of other diseases of urinary system: Secondary | ICD-10-CM | POA: Insufficient documentation

## 2014-08-03 DIAGNOSIS — I10 Essential (primary) hypertension: Secondary | ICD-10-CM | POA: Insufficient documentation

## 2014-08-03 DIAGNOSIS — R0602 Shortness of breath: Secondary | ICD-10-CM

## 2014-08-03 DIAGNOSIS — Z880 Allergy status to penicillin: Secondary | ICD-10-CM | POA: Insufficient documentation

## 2014-08-03 DIAGNOSIS — J209 Acute bronchitis, unspecified: Secondary | ICD-10-CM | POA: Insufficient documentation

## 2014-08-03 DIAGNOSIS — Z720 Tobacco use: Secondary | ICD-10-CM | POA: Insufficient documentation

## 2014-08-03 DIAGNOSIS — Z8719 Personal history of other diseases of the digestive system: Secondary | ICD-10-CM | POA: Insufficient documentation

## 2014-08-03 LAB — CBC WITH DIFFERENTIAL/PLATELET
BASOS ABS: 0 10*3/uL (ref 0.0–0.1)
Basophils Relative: 0 % (ref 0–1)
EOS ABS: 0.2 10*3/uL (ref 0.0–0.7)
EOS PCT: 1 % (ref 0–5)
HCT: 41.4 % (ref 39.0–52.0)
Hemoglobin: 14.8 g/dL (ref 13.0–17.0)
Lymphocytes Relative: 21 % (ref 12–46)
Lymphs Abs: 3.5 10*3/uL (ref 0.7–4.0)
MCH: 31.1 pg (ref 26.0–34.0)
MCHC: 35.7 g/dL (ref 30.0–36.0)
MCV: 87 fL (ref 78.0–100.0)
Monocytes Absolute: 1.7 10*3/uL — ABNORMAL HIGH (ref 0.1–1.0)
Monocytes Relative: 10 % (ref 3–12)
NEUTROS ABS: 11 10*3/uL — AB (ref 1.7–7.7)
Neutrophils Relative %: 68 % (ref 43–77)
Platelets: 194 10*3/uL (ref 150–400)
RBC: 4.76 MIL/uL (ref 4.22–5.81)
RDW: 12.3 % (ref 11.5–15.5)
WBC: 16.4 10*3/uL — AB (ref 4.0–10.5)

## 2014-08-03 LAB — I-STAT TROPONIN, ED: Troponin i, poc: 0 ng/mL (ref 0.00–0.08)

## 2014-08-03 LAB — BASIC METABOLIC PANEL
ANION GAP: 11 (ref 5–15)
BUN: 14 mg/dL (ref 6–23)
CHLORIDE: 104 meq/L (ref 96–112)
CO2: 26 mmol/L (ref 19–32)
Calcium: 9.4 mg/dL (ref 8.4–10.5)
Creatinine, Ser: 0.79 mg/dL (ref 0.50–1.35)
GFR calc Af Amer: >90 mL/min (ref >90)
GFR calc non Af Amer: >90 mL/min (ref >90)
Glucose, Bld: 116 mg/dL — ABNORMAL HIGH (ref 70–99)
Potassium: 3.5 mmol/L (ref 3.5–5.1)
Sodium: 141 mmol/L (ref 135–145)

## 2014-08-03 LAB — BRAIN NATRIURETIC PEPTIDE: B Natriuretic Peptide: 34 pg/mL (ref 0.0–100.0)

## 2014-08-03 MED ORDER — IPRATROPIUM-ALBUTEROL 0.5-2.5 (3) MG/3ML IN SOLN
3.0000 mL | Freq: Once | RESPIRATORY_TRACT | Status: AC
  Administered 2014-08-03: 3 mL via RESPIRATORY_TRACT
  Filled 2014-08-03: qty 3

## 2014-08-03 MED ORDER — METHYLPREDNISOLONE SODIUM SUCC 125 MG IJ SOLR
125.0000 mg | Freq: Once | INTRAMUSCULAR | Status: AC
  Administered 2014-08-03: 125 mg via INTRAVENOUS
  Filled 2014-08-03: qty 2

## 2014-08-03 NOTE — ED Notes (Signed)
The patient said he has been coughing and congested for a while. He was treated here and given antibiotics and he said it got better.  He said he started getting sick again new years eve.  Other than the antibiotics he was given he has taken muccinex and nyquil and he is getting worse instead of better.  He also says he feels like he can't breathe with just walking to the bathroom.  He says he does have a productive cough and it is green and yellow and thick.  He says his chest pain is from the coughing.

## 2014-08-03 NOTE — ED Provider Notes (Signed)
CSN: [id number]     Arrival date & time 08/03/14  2142 History   First MD Initiated Contact with Patient 08/03/14 2234     Chief Complaint  Patient presents with  . Shortness of Breath    The patient said he has been coughing and congested for a while. He was treated here and given antibiotics and he said it got better.   . Pleurisy  . Cough     (Consider location/radiation/quality/duration/timing/severity/associated sxs/prior Treatment) Patient is a 49 y.o. male presenting with shortness of breath and cough. The history is provided by the patient.  Shortness of Breath Associated symptoms: cough   Cough Associated symptoms: shortness of breath   He states that he started having a cough about 3 weeks ago. He was treated with a course of azithromycin which did give him some relief but then symptoms recurred about one week ago. Cough is productive of clear sputum. He has had subjective fever but no chills or sweats. He has chest pain only with coughing. There has been vomiting with coughing paroxysms and with taking medication but no actual underlying nausea. He denies any sick contact. He states that he got sick when he took dextromethorphan-guaifenesin concurrently with another medication. Of note, he is a cigarette smoker admitting to smoking 1.5 packs of cigarettes a day. He did not receive an influenza vaccination this year.  Past Medical History  Diagnosis Date  . Migraines   . GERD (gastroesophageal reflux disease)   . Hypertension   . Renal disorder    History reviewed. No pertinent past surgical history. History reviewed. No pertinent family history. History  Substance Use Topics  . Smoking status: Current Every Day Smoker -- 2.00 packs/day for 30 years    Types: Cigarettes  . Smokeless tobacco: Never Used  . Alcohol Use: No    Review of Systems  Respiratory: Positive for cough and shortness of breath.   All other systems reviewed and are negative.     Allergies   Codeine and Penicillins  Home Medications   Prior to Admission medications   Medication Sig Start Date End Date Taking? Authorizing Provider  azithromycin (ZITHROMAX) 250 MG tablet Take 1 tablet (250 mg total) by mouth daily. Take first 2 tablets together, then 1 every day until finished. 04/30/14   Heather Laisure, PA-C  Dextromethorphan-Guaifenesin (TUSSIN DM PO) Take 30 mLs by mouth every 8 (eight) hours as needed (for cough).    Historical Provider, MD  HYDROcodone-homatropine (HYCODAN) 5-1.5 MG/5ML syrup Take 5 mLs by mouth every 6 (six) hours as needed for cough. 04/30/14   Heather Laisure, PA-C  ibuprofen (ADVIL,MOTRIN) 200 MG tablet Take 800 mg by mouth every 8 (eight) hours as needed for moderate pain.    Historical Provider, MD   BP 145/69 mmHg  Pulse 88  Temp(Src) 98.1 F (36.7 C)  Resp 31  Ht  (1.753 m)  Wt 214 lb (97.07 kg)  BMI 31.59 kg/m2  SpO2 92% Physical Exam  Nursing note and vitals reviewed.  49 year old male, resting comfortably and in no acute distress. Vital signs are significant for tachypnea and borderline hypertension. Oxygen saturation is 92%, which is normal. Head is normocephalic and atraumatic. PERRLA, EOMI. Oropharynx is clear. Neck is nontender and supple without adenopathy or JVD. Back is nontender and there is no CVA tenderness. Lungs have a prolonged exhalation phase without overt rales, wheezes, or rhonchi. Chest is nontender. Heart has regular rate and rhythm without murmur. Abdomen is soft,  flat, nontender without masses or hepatosplenomegaly and peristalsis is normoactive. Extremities have no cyanosis or edema, full range of motion is present. Skin is warm and dry without rash. Neurologic: Mental status is normal, cranial nerves are intact, there are no motor or sensory deficits.  ED Course  Procedures (including critical care time) Labs Review Results for orders placed or performed during the hospital encounter of 08/03/14  CBC  with Differential  Result Value Ref Range   WBC 16.4 (H) 4.0 - 10.5 K/uL   RBC 4.76 4.22 - 5.81 MIL/uL   Hemoglobin 14.8 13.0 - 17.0 g/dL   HCT 16.1 09.6 - 04.5 %   MCV 87.0 78.0 - 100.0 fL   MCH 31.1 26.0 - 34.0 pg   MCHC 35.7 30.0 - 36.0 g/dL   RDW 40.9 81.1 - 91.4 %   Platelets 194 150 - 400 K/uL   Neutrophils Relative % 68 43 - 77 %   Neutro Abs 11.0 (H) 1.7 - 7.7 K/uL   Lymphocytes Relative 21 12 - 46 %   Lymphs Abs 3.5 0.7 - 4.0 K/uL   Monocytes Relative 10 3 - 12 %   Monocytes Absolute 1.7 (H) 0.1 - 1.0 K/uL   Eosinophils Relative 1 0 - 5 %   Eosinophils Absolute 0.2 0.0 - 0.7 K/uL   Basophils Relative 0 0 - 1 %   Basophils Absolute 0.0 0.0 - 0.1 K/uL  Basic metabolic panel  Result Value Ref Range   Sodium 141 135 - 145 mmol/L   Potassium 3.5 3.5 - 5.1 mmol/L   Chloride 104 96 - 112 mEq/L   CO2 26 19 - 32 mmol/L   Glucose, Bld 116 (H) 70 - 99 mg/dL   BUN 14 6 - 23 mg/dL   Creatinine, Ser 7.82 0.50 - 1.35 mg/dL   Calcium 9.4 8.4 - 95.6 mg/dL   GFR calc non Af Amer >90 >90 mL/min   GFR calc Af Amer >90 >90 mL/min   Anion gap 11 5 - 15  BNP (order ONLY if patient complains of dyspnea/SOB AND you have documented it for THIS visit)  Result Value Ref Range   B Natriuretic Peptide 34.0 0.0 - 100.0 pg/mL  I-stat troponin, ED (not at [facility])  Result Value Ref Range   Troponin i, poc 0.00 0.00 - 0.08 ng/mL   Comment 3           Imaging Review Dg Chest 2 View  08/03/2014   CLINICAL DATA:  Centralized chest pain and weakness cysts evening. Shortness of breath.  EXAM: CHEST  2 VIEW  COMPARISON:  04/30/2014; 10/05/2012  FINDINGS: Grossly unchanged cardiac silhouette. There is increased nodular prominence of the bilateral pulmonary hila. There is increased diffuse nodular thickening of the pulmonary interstitium, worse within the bilateral infrahilar lungs. No discrete focal airspace opacities. No pleural effusion or pneumothorax. The lungs remain hyperexpanded. Unchanged bones  including old fracture involving the posterior lateral aspect of the right sixth rib.  IMPRESSION: 1. Findings suggestive of airways disease / bronchitis. No focal airspace opacities to suggest pneumonia. 2. Apparent increase nodular prominence of the bilateral pulmonary hila suggestive of associated hilar adenopathy. As such, a follow-up chest radiograph in 4-6 weeks after treatment is recommended to ensure resolution.   Electronically Signed   By: Simonne Come M.D.   On: 08/03/2014 22:43     EKG Interpretation   Date/Time:  Thursday August 03 2014 21:49:16 EST Ventricular Rate:  100 PR Interval:  136 QRS  Duration: 98 QT Interval:  358 QTC Calculation: 461 R Axis:   73 Text Interpretation:  Normal sinus rhythm Nonspecific ST abnormality  Abnormal QRS-T angle, consider primary T wave abnormality Abnormal ECG  When compared with ECG of 10/06/2012, Nonspecific ST abnormality is now  Present Confirmed by Detar NorthGLICK  MD, Froylan Hobby ([id number]) on 08/03/2014 9:57:43 PM      MDM   Final diagnoses:  Acute bronchitis, unspecified organism    Respiratory tract infection oh consistent with acute bronchitis. Chest x-ray shows no evidence of pneumonia but does have hilar adenopathy. Patient was advised of need to repeat the x-ray in about 6 weeks. He will be given a therapeutic trial of albuterol with ipratropium and also be given a dose of methylprednisolone. ECG does show some changes that could represent ischemia but his symptoms are not consistent with myocardial ischemia.  He feels much better after albuterol with ipratropium. Because of temporary response to prior course of antibiotics, it is elected to treat him with additional antibiotics. He is given dose of doxycycline. He is given an albuterol inhaler to take home and is discharged with prescriptions for doxycycline, prednisone, and hydrocodone-acetaminophen for cough.  Dione Boozeavid Lashika Erker, MD 08/04/14 [phone]

## 2014-08-04 MED ORDER — DOXYCYCLINE HYCLATE 100 MG PO TABS
100.0000 mg | ORAL_TABLET | Freq: Once | ORAL | Status: AC
  Administered 2014-08-04: 100 mg via ORAL
  Filled 2014-08-04: qty 1

## 2014-08-04 MED ORDER — PREDNISONE 10 MG PO TABS: 20.0000 mg | ORAL_TABLET | Freq: Every day | ORAL | Status: DC

## 2014-08-04 MED ORDER — DOXYCYCLINE HYCLATE 100 MG PO CAPS: 100.0000 mg | ORAL_CAPSULE | Freq: Two times a day (BID) | ORAL | Status: DC

## 2014-08-04 MED ORDER — HYDROCODONE-ACETAMINOPHEN 5-325 MG PO TABS: 1.0000 | ORAL_TABLET | ORAL | Status: DC | PRN

## 2014-08-04 MED ORDER — ALBUTEROL SULFATE HFA 108 (90 BASE) MCG/ACT IN AERS
2.0000 | INHALATION_SPRAY | RESPIRATORY_TRACT | Status: DC | PRN
  Administered 2014-08-04: 2 via RESPIRATORY_TRACT
  Filled 2014-08-04: qty 6.7

## 2014-08-04 NOTE — Discharge Instructions (Signed)
Acute Bronchitis Bronchitis is inflammation of the airways that extend from the windpipe into the lungs (bronchi). The inflammation often causes mucus to develop. This leads to a cough, which is the most common symptom of bronchitis.  In acute bronchitis, the condition usually develops suddenly and goes away over time, usually in a couple weeks. Smoking, allergies, and asthma can make bronchitis worse. Repeated episodes of bronchitis may cause further lung problems.  CAUSES Acute bronchitis is most often caused by the same virus that causes a cold. The virus can spread from person to person (contagious) through coughing, sneezing, and touching contaminated objects. SIGNS AND SYMPTOMS   Cough.   Fever.   Coughing up mucus.   Body aches.   Chest congestion.   Chills.   Shortness of breath.   Sore throat.  DIAGNOSIS  Acute bronchitis is usually diagnosed through a physical exam. Your health care provider will also ask you questions about your medical history. Tests, such as chest X-rays, are sometimes done to rule out other conditions.  TREATMENT  Acute bronchitis usually goes away in a couple weeks. Oftentimes, no medical treatment is necessary. Medicines are sometimes given for relief of fever or cough. Antibiotic medicines are usually not needed but may be prescribed in certain situations. In some cases, an inhaler may be recommended to help reduce shortness of breath and control the cough. A cool mist vaporizer may also be used to help thin bronchial secretions and make it easier to clear the chest.  HOME CARE INSTRUCTIONS  Get plenty of rest.   Drink enough fluids to keep your urine clear or pale yellow (unless you have a medical condition that requires fluid restriction). Increasing fluids may help thin your respiratory secretions (sputum) and reduce chest congestion, and it will prevent dehydration.   Take medicines only as directed by your health care provider.  If  you were prescribed an antibiotic medicine, finish it all even if you start to feel better.  Avoid smoking and secondhand smoke. Exposure to cigarette smoke or irritating chemicals will make bronchitis worse. If you are a smoker, consider using nicotine gum or skin patches to help control withdrawal symptoms. Quitting smoking will help your lungs heal faster.   Reduce the chances of another bout of acute bronchitis by washing your hands frequently, avoiding people with cold symptoms, and trying not to touch your hands to your mouth, nose, or eyes.   Keep all follow-up visits as directed by your health care provider.  SEEK MEDICAL CARE IF: Your symptoms do not improve after 1 week of treatment.  SEEK IMMEDIATE MEDICAL CARE IF:  You develop an increased fever or chills.   You have chest pain.   You have severe shortness of breath.  You have bloody sputum.   You develop dehydration.  You faint or repeatedly feel like you are going to pass out.  You develop repeated vomiting.  You develop a severe headache. MAKE SURE YOU:   Understand these instructions.  Will watch your condition.  Will get help right away if you are not doing well or get worse. Document Released: 08/14/2004 Document Revised: 11/21/2013 Document Reviewed: 12/28/2012 [facility] Patient Information 2015 [place], [place]. This information is not intended to replace advice given to you by your health care provider. Make sure you discuss any questions you have with your health care provider.  Smoking Hazards Smoking cigarettes is extremely bad for your health. Tobacco smoke has over 200 known poisons in it. It contains the poisonous  gases nitrogen oxide and carbon monoxide. There are over 60 chemicals in tobacco smoke that cause cancer. Some of the chemicals found in cigarette smoke include:   Cyanide.   Benzene.   Formaldehyde.   Methanol (wood alcohol).   Acetylene (fuel used in welding torches).    Ammonia.  Even smoking lightly shortens your life expectancy by several years. You can greatly reduce the risk of medical problems for you and your family by stopping now. Smoking is the most preventable cause of death and disease in our society. Within days of quitting smoking, your circulation improves, you decrease the risk of having a heart attack, and your lung capacity improves. There may be some increased phlegm in the first few days after quitting, and it may take months for your lungs to clear up completely. Quitting for 10 years reduces your risk of developing lung cancer to almost that of a nonsmoker.  WHAT ARE THE RISKS OF SMOKING? Cigarette smokers have an increased risk of many serious medical problems, including:  Lung cancer.   Lung disease (such as pneumonia, bronchitis, and emphysema).   Heart attack and chest pain due to the heart not getting enough oxygen (angina).   Heart disease and peripheral blood vessel disease.   Hypertension.   Stroke.   Oral cancer (cancer of the lip, mouth, or voice box).   Bladder cancer.   Pancreatic cancer.   Cervical cancer.   Pregnancy complications, including premature birth.   Stillbirths and smaller newborn babies, birth defects, and genetic damage to sperm.   Early menopause.   Lower estrogen level for women.   Infertility.   Facial wrinkles.   Blindness.   Increased risk of broken bones (fractures).   Senile dementia.   Stomach ulcers and internal bleeding.   Delayed wound healing and increased risk of complications during surgery. Because of secondhand smoke exposure, children of smokers have an increased risk of the following:   Sudden infant death syndrome (SIDS).   Respiratory infections.   Lung cancer.   Heart disease.   Ear infections.  WHY IS SMOKING ADDICTIVE? Nicotine is the chemical agent in tobacco that is capable of causing addiction or dependence. When you  smoke and inhale, nicotine is absorbed rapidly into the bloodstream through your lungs. Both inhaled and noninhaled nicotine may be addictive.  WHAT ARE THE BENEFITS OF QUITTING?  There are many health benefits to quitting smoking. Some are:   The likelihood of developing cancer and heart disease decreases. Health improvements are seen almost immediately.   Blood pressure, pulse rate, and breathing patterns start returning to normal soon after quitting.   People who quit may see an improvement in their overall quality of life.  HOW DO YOU QUIT SMOKING? Smoking is an addiction with both physical and psychological effects, and longtime habits can be hard to change. Your health care provider can recommend:  Programs and community resources, which may include group support, education, or therapy.  Replacement products, such as patches, gum, and nasal sprays. Use these products only as directed. Do not replace cigarette smoking with electronic cigarettes (commonly called e-cigarettes). The safety of e-cigarettes is unknown, and some may contain harmful chemicals. FOR MORE INFORMATION  American Lung Association: www.lung.org  American Cancer Society: www.cancer.org Document Released: 08/14/2004 Document Revised: 04/27/2013 Document Reviewed: 12/27/2012 [facility] Patient Information 2015 [place], [place]. This information is not intended to replace advice given to you by your health care provider. Make sure you discuss any questions you have with  your health care provider.  Albuterol inhalation aerosol What is this medicine? ALBUTEROL (al Norm[place]ean [place]) is a bronchodilator. It helps open up the airways in your lungs to make it easier to breathe. This medicine is used to treat and to prevent bronchospasm. This medicine may be used for other purposes; ask your health care provider or pharmacist if you have questions. COMMON BRAND NAME(S): Proair HFA, Proventil, Proventil HFA, Respirol, Ventolin,  Ventolin HFA What should I tell my health care provider before I take this medicine? They need to know if you have any of the following conditions: -diabetes -heart disease or irregular heartbeat -high blood pressure -pheochromocytoma -seizures -thyroid disease -an unusual or allergic reaction to albuterol, levalbuterol, sulfites, other medicines, foods, dyes, or preservatives -pregnant or trying to get pregnant -breast-feeding How should I use this medicine? This medicine is for inhalation through the mouth. Follow the directions on your prescription label. Take your medicine at regular intervals. Do not use more often than directed. Make sure that you are using your inhaler correctly. Ask you doctor or health care provider if you have any questions. Talk to your pediatrician regarding the use of this medicine in children. Special care may be needed. Overdosage: If you think you have taken too much of this medicine contact a poison control center or emergency room at once. NOTE: This medicine is only for you. Do not share this medicine with others. What if I miss a dose? If you miss a dose, use it as soon as you can. If it is almost time for your next dose, use only that dose. Do not use double or extra doses. What may interact with this medicine? -anti-infectives like chloroquine and pentamidine -caffeine -cisapride -diuretics -medicines for colds -medicines for depression or for emotional or psychotic conditions -medicines for weight loss including some herbal products -methadone -some antibiotics like clarithromycin, erythromycin, levofloxacin, and linezolid -some heart medicines -steroid hormones like dexamethasone, cortisone, hydrocortisone -theophylline -thyroid hormones This list may not describe all possible interactions. Give your health care provider a list of all the medicines, herbs, non-prescription drugs, or dietary supplements you use. Also tell them if you smoke,  drink alcohol, or use illegal drugs. Some items may interact with your medicine. What should I watch for while using this medicine? Tell your doctor or health care professional if your symptoms do not improve. Do not use extra albuterol. If your asthma or bronchitis gets worse while you are using this medicine, call your doctor right away. If your mouth gets dry try chewing sugarless gum or sucking hard candy. Drink water as directed. What side effects may I notice from receiving this medicine? Side effects that you should report to your doctor or health care professional as soon as possible: -allergic reactions like skin rash, itching or hives, swelling of the face, lips, or tongue -breathing problems -chest pain -feeling faint or lightheaded, falls -high blood pressure -irregular heartbeat -fever -muscle cramps or weakness -pain, tingling, numbness in the hands or feet -vomiting Side effects that usually do not require medical attention (report to your doctor or health care professional if they continue or are bothersome): -cough -difficulty sleeping -headache -nervousness or trembling -stomach upset -stuffy or runny nose -throat irritation -unusual taste This list may not describe all possible side effects. Call your doctor for medical advice about side effects. You may report side effects to FDA at 1-800-FDA-1088. Where should I keep my medicine? Keep out of the reach of children. Store at room  temperature between 15 and 30 degrees C (59 and 86 degrees F). The contents are under pressure and may burst when exposed to heat or flame. Do not freeze. This medicine does not work as well if it is too cold. Throw away any unused medicine after the expiration date. Inhalers need to be thrown away after the labeled number of puffs have been used or by the expiration date; whichever comes first. Ventolin HFA should be thrown away 12 months after removing from foil pouch. Check the instructions  that come with your medicine. NOTE: This sheet is a summary. It may not cover all possible information. If you have questions about this medicine, talk to your doctor, pharmacist, or health care provider.  2015, Elsevier/Gold Standard. (2012-12-23 10:57:17)  Doxycycline tablets or capsules What is this medicine? DOXYCYCLINE (dox i SYE kleen) is a tetracycline antibiotic. It kills certain bacteria or stops their growth. It is used to treat many kinds of infections, like dental, skin, respiratory, and urinary tract infections. It also treats acne, Lyme disease, malaria, and certain sexually transmitted infections. This medicine may be used for other purposes; ask your health care provider or pharmacist if you have questions. COMMON BRAND NAME(S): Acticlate, Adoxa, Adoxa CK, Adoxa Pak, Adoxa TT, Alodox, Avidoxy, Doxal, Monodox, Morgidox 1x, Morgidox 1x Kit, Morgidox 2x, Morgidox 2x Kit, Ocudox, Vibra-Tabs, Vibramycin What should I tell my health care provider before I take this medicine? They need to know if you have any of these conditions: -liver disease -long exposure to sunlight like working outdoors -stomach problems like colitis -an unusual or allergic reaction to doxycycline, tetracycline antibiotics, other medicines, foods, dyes, or preservatives -pregnant or trying to get pregnant -breast-feeding How should I use this medicine? Take this medicine by mouth with a full glass of water. Follow the directions on the prescription label. It is best to take this medicine without food, but if it upsets your stomach take it with food. Take your medicine at regular intervals. Do not take your medicine more often than directed. Take all of your medicine as directed even if you think you are better. Do not skip doses or stop your medicine early. Talk to your pediatrician regarding the use of this medicine in children. Special care may be needed. While this drug may be prescribed for children as young as  23 years old for selected conditions, precautions do apply. Overdosage: If you think you have taken too much of this medicine contact a poison control center or emergency room at once. NOTE: This medicine is only for you. Do not share this medicine with others. What if I miss a dose? If you miss a dose, take it as soon as you can. If it is almost time for your next dose, take only that dose. Do not take double or extra doses. What may interact with this medicine? -antacids -barbiturates -birth control pills -bismuth subsalicylate -carbamazepine -methoxyflurane -other antibiotics -phenytoin -vitamins that contain iron -warfarin This list may not describe all possible interactions. Give your health care provider a list of all the medicines, herbs, non-prescription drugs, or dietary supplements you use. Also tell them if you smoke, drink alcohol, or use illegal drugs. Some items may interact with your medicine. What should I watch for while using this medicine? Tell your doctor or health care professional if your symptoms do not improve. Do not treat diarrhea with over the counter products. Contact your doctor if you have diarrhea that lasts more than 2 days or if it  is severe and watery. Do not take this medicine just before going to bed. It may not dissolve properly when you lay down and can cause pain in your throat. Drink plenty of fluids while taking this medicine to also help reduce irritation in your throat. This medicine can make you more sensitive to the sun. Keep out of the sun. If you cannot avoid being in the sun, wear protective clothing and use sunscreen. Do not use sun lamps or tanning beds/booths. Birth control pills may not work properly while you are taking this medicine. Talk to your doctor about using an extra method of birth control. If you are being treated for a sexually transmitted infection, avoid sexual contact until you have finished your treatment. Your sexual partner  may also need treatment. Avoid antacids, aluminum, calcium, magnesium, and iron products for 4 hours before and 2 hours after taking a dose of this medicine. If you are using this medicine to prevent malaria, you should still protect yourself from contact with mosquitos. Stay in screened-in areas, use mosquito nets, keep your body covered, and use an insect repellent. What side effects may I notice from receiving this medicine? Side effects that you should report to your doctor or health care professional as soon as possible: -allergic reactions like skin rash, itching or hives, swelling of the face, lips, or tongue -difficulty breathing -fever -itching in the rectal or genital area -pain on swallowing -redness, blistering, peeling or loosening of the skin, including inside the mouth -severe stomach pain or cramps -unusual bleeding or bruising -unusually weak or tired -yellowing of the eyes or skin Side effects that usually do not require medical attention (report to your doctor or health care professional if they continue or are bothersome): -diarrhea -loss of appetite -nausea, vomiting This list may not describe all possible side effects. Call your doctor for medical advice about side effects. You may report side effects to FDA at 1-800-FDA-1088. Where should I keep my medicine? Keep out of the reach of children. Store at room temperature, below 30 degrees C (86 degrees F). Protect from light. Keep container tightly closed. Throw away any unused medicine after the expiration date. Taking this medicine after the expiration date can make you seriously ill. NOTE: This sheet is a summary. It may not cover all possible information. If you have questions about this medicine, talk to your doctor, pharmacist, or health care provider.  2015, Elsevier/Gold Standard. (2013-05-13 13:58:06)  Acetaminophen; Hydrocodone tablets or capsules What is this medicine? ACETAMINOPHEN; HYDROCODONE (a set a  MEE noe fen; hye droe KOE done) is a pain reliever. It is used to treat mild to moderate pain. This medicine may be used for other purposes; ask your health care provider or pharmacist if you have questions. COMMON BRAND NAME(S): Anexsia, Bancap HC, Ceta-Plus, Co-Gesic, Comfortpak, Dolagesic, [employer], DuoCet, Hydrocet, Hydrogesic, [place], Lorcet HD, Lorcet Plus, Lortab, Margesic H, Maxidone, [place], Polygesic, [place], [place], [place], Vicodin, Vicodin ES, Vicodin HP, Charlane Ferretti What should I tell my health care provider before I take this medicine? They need to know if you have any of these conditions: -brain tumor -Crohn's disease, inflammatory bowel disease, or ulcerative colitis -drug abuse or addiction -head injury -heart or circulation problems -if you often drink alcohol -kidney disease or problems going to the bathroom -liver disease -lung disease, asthma, or breathing problems -an unusual or allergic reaction to acetaminophen, hydrocodone, other opioid analgesics, other medicines, foods, dyes, or preservatives -pregnant or trying to get pregnant -breast-feeding  How should I use this medicine? Take this medicine by mouth. Swallow it with a full glass of water. Follow the directions on the prescription label. If the medicine upsets your stomach, take the medicine with food or milk. Do not take more than you are told to take. Talk to your pediatrician regarding the use of this medicine in children. This medicine is not approved for use in children. Overdosage: If you think you have taken too much of this medicine contact a poison control center or emergency room at once. NOTE: This medicine is only for you. Do not share this medicine with others. What if I miss a dose? If you miss a dose, take it as soon as you can. If it is almost time for your next dose, take only that dose. Do not take double or extra doses. What may interact with this  medicine? -alcohol -antihistamines -isoniazid -medicines for depression, anxiety, or psychotic disturbances -medicines for sleep -muscle relaxants -naltrexone -narcotic medicines (opiates) for pain -phenobarbital -ritonavir -tramadol This list may not describe all possible interactions. Give your health care provider a list of all the medicines, herbs, non-prescription drugs, or dietary supplements you use. Also tell them if you smoke, drink alcohol, or use illegal drugs. Some items may interact with your medicine. What should I watch for while using this medicine? Tell your doctor or health care professional if your pain does not go away, if it gets worse, or if you have new or a different type of pain. You may develop tolerance to the medicine. Tolerance means that you will need a higher dose of the medicine for pain relief. Tolerance is normal and is expected if you take the medicine for a long time. Do not suddenly stop taking your medicine because you may develop a severe reaction. Your body becomes used to the medicine. This does NOT mean you are addicted. Addiction is a behavior related to getting and using a drug for a non-medical reason. If you have pain, you have a medical reason to take pain medicine. Your doctor will tell you how much medicine to take. If your doctor wants you to stop the medicine, the dose will be slowly lowered over time to avoid any side effects. You may get drowsy or dizzy when you first start taking the medicine or change doses. Do not drive, use machinery, or do anything that may be dangerous until you know how the medicine affects you. Stand or sit up slowly. There are different types of narcotic medicines (opiates) for pain. If you take more than one type at the same time, you may have more side effects. Give your health care provider a list of all medicines you use. Your doctor will tell you how much medicine to take. Do not take more medicine than directed.  Call emergency for help if you have problems breathing. The medicine will cause constipation. Try to have a bowel movement at least every 2 to 3 days. If you do not have a bowel movement for 3 days, call your doctor or health care professional. Too much acetaminophen can be very dangerous. Do not take Tylenol (acetaminophen) or medicines that contain acetaminophen with this medicine. Many non-prescription medicines contain acetaminophen. Always read the labels carefully. What side effects may I notice from receiving this medicine? Side effects that you should report to your doctor or health care professional as soon as possible: -allergic reactions like skin rash, itching or hives, swelling of the face, lips, or tongue -breathing  problems -confusion -feeling faint or lightheaded, falls -stomach pain -yellowing of the eyes or skin Side effects that usually do not require medical attention (report to your doctor or health care professional if they continue or are bothersome): -nausea, vomiting -stomach upset This list may not describe all possible side effects. Call your doctor for medical advice about side effects. You may report side effects to FDA at 1-800-FDA-1088. Where should I keep my medicine? Keep out of the reach of children. This medicine can be abused. Keep your medicine in a safe place to protect it from theft. Do not share this medicine with anyone. Selling or giving away this medicine is dangerous and against the law. Store at room temperature between 15 and 30 degrees C (59 and 86 degrees F). Protect from light. Keep container tightly closed. Throw away any unused medicine after the expiration date. Discard unused medicine and used packaging carefully. Pets and children can be harmed if they find used or lost packages. NOTE: This sheet is a summary. It may not cover all possible information. If you have questions about this medicine, talk to your doctor, pharmacist, or health care  provider.  2015, Elsevier/Gold Standard. (2013-02-28 13:15:56)  Prednisone tablets What is this medicine? PREDNISONE (PRED ni sone) is a corticosteroid. It is commonly used to treat inflammation of the skin, joints, lungs, and other organs. Common conditions treated include asthma, allergies, and arthritis. It is also used for other conditions, such as blood disorders and diseases of the adrenal glands. This medicine may be used for other purposes; ask your health care provider or pharmacist if you have questions. COMMON BRAND NAME(S): Deltasone, Predone, Sterapred, Sterapred DS What should I tell my health care provider before I take this medicine? They need to know if you have any of these conditions: -Cushing's syndrome -diabetes -glaucoma -heart disease -high blood pressure -infection (especially a virus infection such as chickenpox, cold sores, or herpes) -kidney disease -liver disease -mental illness -myasthenia gravis -osteoporosis -seizures -stomach or intestine problems -thyroid disease -an unusual or allergic reaction to lactose, prednisone, other medicines, foods, dyes, or preservatives -pregnant or trying to get pregnant -breast-feeding How should I use this medicine? Take this medicine by mouth with a glass of water. Follow the directions on the prescription label. Take this medicine with food. If you are taking this medicine once a day, take it in the morning. Do not take more medicine than you are told to take. Do not suddenly stop taking your medicine because you may develop a severe reaction. Your doctor will tell you how much medicine to take. If your doctor wants you to stop the medicine, the dose may be slowly lowered over time to avoid any side effects. Talk to your pediatrician regarding the use of this medicine in children. Special care may be needed. Overdosage: If you think you have taken too much of this medicine contact a poison control center or emergency  room at once. NOTE: This medicine is only for you. Do not share this medicine with others. What if I miss a dose? If you miss a dose, take it as soon as you can. If it is almost time for your next dose, talk to your doctor or health care professional. You may need to miss a dose or take an extra dose. Do not take double or extra doses without advice. What may interact with this medicine? Do not take this medicine with any of the following medications: -metyrapone -mifepristone This medicine may also  interact with the following medications: -aminoglutethimide -amphotericin B -aspirin and aspirin-like medicines -barbiturates -certain medicines for diabetes, like glipizide or glyburide -cholestyramine -cholinesterase inhibitors -cyclosporine -digoxin -diuretics -ephedrine -male hormones, like estrogens and birth control pills -isoniazid -ketoconazole -NSAIDS, medicines for pain and inflammation, like ibuprofen or naproxen -phenytoin -rifampin -toxoids -vaccines -warfarin This list may not describe all possible interactions. Give your health care provider a list of all the medicines, herbs, non-prescription drugs, or dietary supplements you use. Also tell them if you smoke, drink alcohol, or use illegal drugs. Some items may interact with your medicine. What should I watch for while using this medicine? Visit your doctor or health care professional for regular checks on your progress. If you are taking this medicine over a prolonged period, carry an identification card with your name and address, the type and dose of your medicine, and your doctor's name and address. This medicine may increase your risk of getting an infection. Tell your doctor or health care professional if you are around anyone with measles or chickenpox, or if you develop sores or blisters that do not heal properly. If you are going to have surgery, tell your doctor or health care professional that you have taken  this medicine within the last twelve months. Ask your doctor or health care professional about your diet. You may need to lower the amount of salt you eat. This medicine may affect blood sugar levels. If you have diabetes, check with your doctor or health care professional before you change your diet or the dose of your diabetic medicine. What side effects may I notice from receiving this medicine? Side effects that you should report to your doctor or health care professional as soon as possible: -allergic reactions like skin rash, itching or hives, swelling of the face, lips, or tongue -changes in emotions or moods -changes in vision -depressed mood -eye pain -fever or chills, cough, sore throat, pain or difficulty passing urine -increased thirst -swelling of ankles, feet Side effects that usually do not require medical attention (report to your doctor or health care professional if they continue or are bothersome): -confusion, excitement, restlessness -headache -nausea, vomiting -skin problems, acne, thin and shiny skin -trouble sleeping -weight gain This list may not describe all possible side effects. Call your doctor for medical advice about side effects. You may report side effects to FDA at 1-800-FDA-1088. Where should I keep my medicine? Keep out of the reach of children. Store at room temperature between 15 and 30 degrees C (59 and 86 degrees F). Protect from light. Keep container tightly closed. Throw away any unused medicine after the expiration date. NOTE: This sheet is a summary. It may not cover all possible information. If you have questions about this medicine, talk to your doctor, pharmacist, or health care provider.  2015, [employer]. (2011-02-20 10:57:14)

## 2016-10-26 ENCOUNTER — Encounter (HOSPITAL_BASED_OUTPATIENT_CLINIC_OR_DEPARTMENT_OTHER): Payer: Self-pay | Admitting: Emergency Medicine

## 2016-10-26 ENCOUNTER — Emergency Department (HOSPITAL_BASED_OUTPATIENT_CLINIC_OR_DEPARTMENT_OTHER)
Admission: EM | Admit: 2016-10-26 | Discharge: 2016-10-26 | Disposition: A | Discharge status: Home / Self Care | Payer: Self-pay | Attending: Emergency Medicine | Admitting: Emergency Medicine

## 2016-10-26 DIAGNOSIS — R110 Nausea: Secondary | ICD-10-CM | POA: Insufficient documentation

## 2016-10-26 DIAGNOSIS — F17210 Nicotine dependence, cigarettes, uncomplicated: Secondary | ICD-10-CM | POA: Insufficient documentation

## 2016-10-26 DIAGNOSIS — I10 Essential (primary) hypertension: Secondary | ICD-10-CM | POA: Insufficient documentation

## 2016-10-26 DIAGNOSIS — Z79899 Other long term (current) drug therapy: Secondary | ICD-10-CM | POA: Insufficient documentation

## 2016-10-26 DIAGNOSIS — H9209 Otalgia, unspecified ear: Secondary | ICD-10-CM | POA: Insufficient documentation

## 2016-10-26 DIAGNOSIS — H539 Unspecified visual disturbance: Secondary | ICD-10-CM | POA: Insufficient documentation

## 2016-10-26 DIAGNOSIS — R42 Dizziness and giddiness: Secondary | ICD-10-CM | POA: Insufficient documentation

## 2016-10-26 LAB — CBC
HCT: 46.1 % (ref 39.0–52.0)
Hemoglobin: 17 g/dL (ref 13.0–17.0)
MCH: 31.4 pg (ref 26.0–34.0)
MCHC: 36.9 g/dL — ABNORMAL HIGH (ref 30.0–36.0)
MCV: 85.2 fL (ref 78.0–100.0)
Platelets: 212 10*3/uL (ref 150–400)
RBC: 5.41 MIL/uL (ref 4.22–5.81)
RDW: 12.2 % (ref 11.5–15.5)
WBC: 9.7 10*3/uL (ref 4.0–10.5)

## 2016-10-26 LAB — BASIC METABOLIC PANEL
Anion gap: 9 (ref 5–15)
BUN: 14 mg/dL (ref 6–20)
CO2: 28 mmol/L (ref 22–32)
Calcium: 9.2 mg/dL (ref 8.9–10.3)
Chloride: 99 mmol/L — ABNORMAL LOW (ref 101–111)
Creatinine, Ser: 0.96 mg/dL (ref 0.61–1.24)
GFR calc Af Amer: >60 mL/min (ref >60)
GFR calc non Af Amer: >60 mL/min (ref >60)
Glucose, Bld: 183 mg/dL — ABNORMAL HIGH (ref 65–99)
Potassium: 3.4 mmol/L — ABNORMAL LOW (ref 3.5–5.1)
Sodium: 136 mmol/L (ref 135–145)

## 2016-10-26 LAB — CBG MONITORING, ED: Glucose-Capillary: 214 mg/dL — ABNORMAL HIGH (ref 65–99)

## 2016-10-26 MED ORDER — HYDRALAZINE HCL 20 MG/ML IJ SOLN
15.0000 mg | Freq: Once | INTRAMUSCULAR | Status: AC
  Administered 2016-10-26: 15 mg via INTRAVENOUS
  Filled 2016-10-26: qty 1

## 2016-10-26 MED ORDER — HYDROCHLOROTHIAZIDE 25 MG PO TABS: 25.0000 mg | ORAL_TABLET | Freq: Every day | ORAL | 3 refills | Status: DC

## 2016-10-26 MED ORDER — SODIUM CHLORIDE 0.9 % IV BOLUS (SEPSIS)
1000.0000 mL | Freq: Once | INTRAVENOUS | Status: AC
  Administered 2016-10-26: 1000 mL via INTRAVENOUS

## 2016-10-26 MED ORDER — PROMETHAZINE HCL 25 MG/ML IJ SOLN
12.5000 mg | Freq: Once | INTRAMUSCULAR | Status: AC
  Administered 2016-10-26: 12.5 mg via INTRAVENOUS
  Filled 2016-10-26: qty 1

## 2016-10-26 MED ORDER — MECLIZINE HCL 25 MG PO TABS
50.0000 mg | ORAL_TABLET | Freq: Once | ORAL | Status: AC
  Administered 2016-10-26: 50 mg via ORAL
  Filled 2016-10-26: qty 2

## 2016-10-26 MED ORDER — MECLIZINE HCL 25 MG PO TABS
25.0000 mg | ORAL_TABLET | Freq: Three times a day (TID) | ORAL | 0 refills | Status: DC | PRN
Start: 2016-10-26 — End: 2017-04-30

## 2016-10-26 NOTE — ED Notes (Signed)
ED Provider at bedside. 

## 2016-10-26 NOTE — ED Provider Notes (Signed)
[facility] Note   CSN: [id number] Arrival date & time: 10/26/16  1257    By signing my name below, I, [name], attest that this documentation has been prepared under the direction and in the presence of Raeford Razor, MD . Electronically Signed: [name], Scribe. 10/26/2016. 1:19 PM. History   Chief Complaint Chief Complaint  Patient presents with  . Dizziness    The history is provided by the patient. No language interpreter was used.    HPI Comments:  Julian Atkinson is a 51 y.o. male who presents to the Emergency Department complaining of intermittent episodes of room spinning dizziness x 1 month. Pt has a h/o vertigo and states symptom today is similar but he usually only has 2 episodes a year. He reports associated nausea, blurry vision with these episodes, and clicking in the right ear. No recent HA. Pt reports taking his "dizzy pills" without relief. He states they usually help and notes he has run out of them.  Pt is also complaining of recurrent boils to his groin. No pain at this time.  Past Medical History:  Diagnosis Date  . GERD (gastroesophageal reflux disease)   . Hypertension   . Migraines   . Renal disorder     Patient Active Problem List   Diagnosis Date Noted  . Dehydration 10/06/2012  . High blood pressure 10/06/2012  . Lightheadedness 10/05/2012  . Tobacco abuse 10/05/2012  . Nausea 10/05/2012    History reviewed. No pertinent surgical history.     Home Medications    Prior to Admission medications   Medication Sig Start Date End Date Taking? Authorizing Provider  ibuprofen (ADVIL,MOTRIN) 200 MG tablet Take 800 mg by mouth every 8 (eight) hours as needed for moderate pain.   Yes Historical Provider, MD  azithromycin (ZITHROMAX) 250 MG tablet Take 1 tablet (250 mg total) by mouth daily. Take first 2 tablets together, then 1 every day until finished. 04/30/14   Heather Laisure, PA-C  Dextromethorphan-Guaifenesin  (TUSSIN DM PO) Take 30 mLs by mouth every 8 (eight) hours as needed (for cough).    Historical Provider, MD  doxycycline (VIBRAMYCIN) 100 MG capsule Take 1 capsule (100 mg total) by mouth 2 (two) times daily. One po bid x 7 days 08/04/14   Dione Booze, MD  HYDROcodone-acetaminophen [facility] 5-325 MG per tablet Take 1 tablet by mouth every 4 (four) hours as needed for moderate pain (or coughing). 08/04/14   Dione Booze, MD  HYDROcodone-homatropine [facility]) 5-1.5 MG/5ML syrup Take 5 mLs by mouth every 6 (six) hours as needed for cough. 04/30/14   Heather Laisure, PA-C  predniSONE (DELTASONE) 10 MG tablet Take 2 tablets (20 mg total) by mouth daily. 08/04/14   Dione Booze, MD    Family History No family history on file.  Social History Social History  Substance Use Topics  . Smoking status: Current Every Day Smoker    Packs/day: 2.00    Years: 30.00    Types: Cigarettes  . Smokeless tobacco: Never Used  . Alcohol use No     Allergies   Codeine and Penicillins   Review of Systems Review of Systems  HENT: Positive for ear pain.   Eyes: Positive for visual disturbance.  Gastrointestinal: Positive for nausea.  Skin: Positive for wound.  Neurological: Positive for dizziness. Negative for headaches.  All other systems reviewed and are negative.   Physical Exam Updated Vital Signs BP (!) 142/82   Pulse 74   Temp 97.7  F (36.5 C) (Oral)   Resp (!) 23   Ht  (1.753 m)   Wt 225 lb (102.1 kg)   SpO2 96%   BMI 33.23 kg/m   Physical Exam  Constitutional: He is oriented to person, place, and time. He appears well-developed and well-nourished.  HENT:  Head: Normocephalic and atraumatic.  Right Ear: Tympanic membrane normal.  Left Ear: Tympanic membrane normal.  Eyes: EOM are normal.  Neck: Normal range of motion.  Cardiovascular: Normal rate, regular rhythm, normal heart sounds and intact distal pulses.   Pulmonary/Chest: Effort normal and breath sounds normal. No respiratory  distress.  Abdominal: Soft. He exhibits no distension. There is no tenderness.  Genitourinary:  Genitourinary Comments: Intertrigo to groin  Pea sized nodule to right hemiscrotum; no fluctuance or drainage, non tender   Musculoskeletal: Normal range of motion.  Neurological: He is alert and oriented to person, place, and time. No cranial nerve deficit or sensory deficit. He exhibits normal muscle tone. Coordination normal.  Good finger to nose bilaterally  Skin: Skin is warm and dry.  Psychiatric: He has a normal mood and affect. Judgment normal.  Nursing note and vitals reviewed.    ED Treatments / Results  DIAGNOSTIC STUDIES:  Oxygen Saturation is 96% on RA, normal by my interpretation.    COORDINATION OF CARE:  1:16 PM Discussed treatment plan with pt at bedside and pt agreed to plan.  Labs (all labs ordered are listed, but only abnormal results are displayed) Labs Reviewed  BASIC METABOLIC PANEL - Abnormal; Notable for the following:       Result Value   Potassium 3.4 (*)    Chloride 99 (*)    Glucose, Bld 183 (*)    All other components within normal limits  CBC - Abnormal; Notable for the following:    MCHC 36.9 (*)    All other components within normal limits  CBG MONITORING, ED - Abnormal; Notable for the following:    Glucose-Capillary 214 (*)    All other components within normal limits  URINALYSIS, ROUTINE W REFLEX MICROSCOPIC    EKG  EKG Interpretation  Date/Time:  yM with vertigo. Suspect peripheral etiology. Nonfocal neuro exam. No pain. Ongoing for months. Positional. Reproducible. Will tx symptoms. Epley maneuvers discussed. Will restart on HCTZ for HTN. Needs PCP FU.   Final Clinical Impressions(s) / ED Diagnoses   Final diagnoses:  Vertigo  Essential hypertension    New Prescriptions  New Prescriptions   No medications on file   I personally preformed the services scribed in my presence. The recorded information has been reviewed is accurate. Raeford Razor, MD.     Raeford Razor, MD 10/29/16 [phone]

## 2016-10-26 NOTE — ED Triage Notes (Signed)
Pt c/o feeling "Dizzy". States he has been having these dizzy spells for over a month. Reports hx of vertigo but is not taking medications. BP elevated on arrival. Has not taken BP pills in over a year. NAD at triage.

## 2016-10-26 NOTE — ED Notes (Signed)
Pt reports dizziness for the past month and has been taking

## 2016-10-31 MED FILL — HYDROCHLOROTHIAZIDE 25 MG T: 25 | 30 days supply | Qty: 30 | Fill #0

## 2016-10-31 MED FILL — TRAVEL SICKNESS 25 MG TAB C: 25 | 10 days supply | Qty: 30 | Fill #0

## 2016-12-02 MED FILL — HYDROCHLOROTHIAZIDE 25 MG T: 25 | 30 days supply | Qty: 30 | Fill #1

## 2016-12-13 ENCOUNTER — Encounter (HOSPITAL_COMMUNITY): Payer: Self-pay | Admitting: *Deleted

## 2016-12-13 ENCOUNTER — Emergency Department (HOSPITAL_COMMUNITY)
Admission: EM | Admit: 2016-12-13 | Discharge: 2016-12-13 | Disposition: A | Discharge status: Home / Self Care | Payer: Self-pay | Attending: Emergency Medicine | Admitting: Emergency Medicine

## 2016-12-13 DIAGNOSIS — N492 Inflammatory disorders of scrotum: Secondary | ICD-10-CM | POA: Insufficient documentation

## 2016-12-13 DIAGNOSIS — F17210 Nicotine dependence, cigarettes, uncomplicated: Secondary | ICD-10-CM | POA: Insufficient documentation

## 2016-12-13 DIAGNOSIS — I10 Essential (primary) hypertension: Secondary | ICD-10-CM | POA: Insufficient documentation

## 2016-12-13 DIAGNOSIS — Z79899 Other long term (current) drug therapy: Secondary | ICD-10-CM | POA: Insufficient documentation

## 2016-12-13 MED ORDER — BACITRACIN ZINC 500 UNIT/GM EX OINT
TOPICAL_OINTMENT | Freq: Once | CUTANEOUS | Status: DC
  Filled 2016-12-13: qty 0.9

## 2016-12-13 MED ORDER — SULFAMETHOXAZOLE-TRIMETHOPRIM 800-160 MG PO TABS: 1.0000 | ORAL_TABLET | Freq: Two times a day (BID) | ORAL | 0 refills | Status: AC

## 2016-12-13 MED ORDER — LIDOCAINE HCL 2 % IJ SOLN
20.0000 mL | Freq: Once | INTRAMUSCULAR | Status: AC
Start: 2016-12-13 — End: 2016-12-13
  Administered 2016-12-13: 400 mg
  Filled 2016-12-13: qty 20

## 2016-12-13 NOTE — Discharge Instructions (Signed)
lease take all of your antibiotics until finished!   Tylenol or ibuprofen as needed for pain. Ice to the affected area will help as well.   Return to the emergency department if you develop a fever, your abscess appears to become infected (growing surrounding redness and warmth), new or worsening symptoms develop, any additional concerns.   Abscess An abscess (boil or furuncle) is an infected area that contains a collection of pus.   SYMPTOMS Signs and symptoms of an abscess include pain, tenderness, redness, or hardness. You may feel a moveable soft area under your skin. An abscess can occur anywhere in the body.   TREATMENT  A surgical cut (incision) may be made over your abscess to drain the pus. Gauze may be packed into the space or a drain may be looped through the abscess cavity (pocket). This provides a drain that will allow the cavity to heal from the inside outwards. The abscess may be painful for a few days, but should feel much better if it was drained.  Your abscess, if seen early, may not have localized and may not have been drained. If not, another appointment may be required if it does not get better on its own or with medications.  HOME CARE INSTRUCTIONS  Keep the skin and clothes clean around your abscess.  If the abscess was drained, you will need to use gauze dressing to collect any draining pus. Dressings will typically need to be changed 3 or more times a day.  The infection may spread by skin contact with others. Avoid skin contact as much as possible.  Practice good hygiene. This includes regular hand washing, cover any draining skin lesions, and do not share personal care items.  SEEK MEDICAL CARE IF:  You develop increased pain, swelling, redness, drainage, or bleeding in the wound site.  You develop signs of generalized infection including muscle aches, chills, fever, or a general ill feeling.  You have an oral temperature above 102 F (38.9 C).  MAKE SURE YOU:    Understand these instructions.  Will watch your condition.  Will get help right away if you are not doing well or get worse.  Document Released: 04/16/2005 Document Revised: 03/19/2011 Document Reviewed: 02/08/2008 [facility] Patient Information 2012 [place], [place].

## 2016-12-13 NOTE — ED Triage Notes (Signed)
Pt reports having an abscess to right upper thigh near his scrotum. Has been there over a month but is getting worse and more painful, reports occ fever.

## 2016-12-13 NOTE — ED Provider Notes (Signed)
[facility]ER[facility]Y DEPT Provider Note   CSN: [id number] Arrival date & time: 12/13/16  [id number]     History   Chief Complaint Chief Complaint  Patient presents with  . Abscess    HPI Julian Atkinson is a 51 y.o. male.  The history is provided by the patient and medical records. No language interpreter was used.  Abscess   Julian Atkinson is a 51 y.o. male  with a PMH of HTN, GERD who presents to the Emergency Department complaining of worsening boil to right scrotum x 2 months. He has been squeezing the area after warm showers daily with small amount of brown pus-like discharge. No fever or chills. No medications taken prior to arrival for symptoms. Has been doing warm soaks at least once a day. He has had abscesses in the past, but never in this location.   Past Medical History:  Diagnosis Date  . GERD (gastroesophageal reflux disease)   . Hypertension   . Migraines   . Renal disorder     Patient Active Problem List   Diagnosis Date Noted  . Dehydration 10/06/2012  . High blood pressure 10/06/2012  . Lightheadedness 10/05/2012  . Tobacco abuse 10/05/2012  . Nausea 10/05/2012    History reviewed. No pertinent surgical history.     Home Medications    Prior to Admission medications   Medication Sig Start Date End Date Taking? Authorizing Provider  albuterol (PROVENTIL HFA;VENTOLIN HFA) 108 (90 Base) MCG/ACT inhaler Inhale 2 puffs into the lungs every 6 (six) hours as needed for wheezing or shortness of breath.   Yes [provider]  hydrochlorothiazide (HYDRODIURIL) 25 MG tablet Take 1 tablet (25 mg total) by mouth daily. 10/26/16  Yes Raeford RazorKohut, Stephen, MD  meclizine (ANTIVERT) 25 MG tablet Take 1 tablet (25 mg total) by mouth 3 (three) times daily as needed for dizziness. 10/26/16  Yes Raeford RazorKohut, Stephen, MD  azithromycin (ZITHROMAX) 250 MG tablet Take 1 tablet (250 mg total) by mouth daily. Take first 2 tablets together, then 1 every day until finished. Patient not  taking: Reported on 12/13/2016 04/30/14   Santiago GladLaisure, Heather, PA-C  doxycycline (VIBRAMYCIN) 100 MG capsule Take 1 capsule (100 mg total) by mouth 2 (two) times daily. One po bid x 7 days Patient not taking: Reported on 12/13/2016 08/04/14   Dione BoozeGlick, David, MD  HYDROcodone-acetaminophen [facility](NORCO) 5-325 MG per tablet Take 1 tablet by mouth every 4 (four) hours as needed for moderate pain (or coughing). Patient not taking: Reported on 12/13/2016 08/04/14   Dione BoozeGlick, David, MD  HYDROcodone-homatropine [facility](HYCODAN) 5-1.5 MG/5ML syrup Take 5 mLs by mouth every 6 (six) hours as needed for cough. Patient not taking: Reported on 12/13/2016 04/30/14   Santiago GladLaisure, Heather, PA-C  predniSONE (DELTASONE) 10 MG tablet Take 2 tablets (20 mg total) by mouth daily. Patient not taking: Reported on 12/13/2016 08/04/14   Dione BoozeGlick, David, MD  sulfamethoxazole-trimethoprim (BACTRIM DS,SEPTRA DS) 800-160 MG tablet Take 1 tablet by mouth 2 (two) times daily. 12/13/16 12/20/16  Brittinie Wherley, Chase PicketJaime Pilcher, PA-C    Family History History reviewed. No pertinent family history.  Social History Social History  Substance Use Topics  . Smoking status: Current Every Day Smoker    Packs/day: 2.00    Years: 30.00    Types: Cigarettes  . Smokeless tobacco: Never Used  . Alcohol use No     Allergies   Codeine and Penicillins   Review of Systems Review of Systems  Skin: Positive for color change and wound.  All  other systems reviewed and are negative.    Physical Exam Updated Vital Signs BP 103/67   Pulse 84   Temp 98 F (36.7 C) (Oral)   Resp 18   Ht 5\' 9"  (1.753 m)   Wt 104.3 kg (230 lb)   SpO2 96%   BMI 33.97 kg/m   Physical Exam  Constitutional: He is oriented to person, place, and time. He appears well-developed and well-nourished. No distress.  HENT:  Head: Normocephalic and atraumatic.  Cardiovascular: Normal rate, regular rhythm and normal heart sounds.   No murmur heard. Pulmonary/Chest: Effort normal and breath sounds  normal. No respiratory distress.  Abdominal: Soft. He exhibits no distension. There is no tenderness.  Genitourinary:  Genitourinary Comments: Right scrotum with 4x4 area of fluctuance with surrounding induration. No tenderness to testicles. No penile lesions / tenderness / discharge.  Neurological: He is alert and oriented to person, place, and time.  Skin: Skin is warm and dry.  Nursing note and vitals reviewed.    ED Treatments / Results  Labs (all labs ordered are listed, but only abnormal results are displayed) Labs Reviewed - No data to display  EKG  EKG Interpretation None       Radiology No results found.  Procedures Procedures (including critical care time)  INCISION AND DRAINAGE Performed by: Chase Picket Elmarie Devlin Consent: Verbal consent obtained. Risks and benefits: risks, benefits and alternatives were discussed Time out performed prior to procedure Type: abscess Body area: Right scrotum Anesthesia: local infiltration Incision was made with a scalpel. Local anesthetic: lidocaine 2%  Anesthetic total: 4 ml Complexity: complex Blunt dissection to break up loculations Drainage: purulent  Drainage amount: copious  Packing material: None Patient tolerance: Patient tolerated the procedure well with no immediate complications.   Medications Ordered in ED Medications  bacitracin ointment (not administered)  lidocaine (XYLOCAINE) 2 % (with pres) injection 400 mg (400 mg Infiltration Given by Other 12/13/16 0944)     Initial Impression / Assessment and Plan / ED Course  I have reviewed the triage vital signs and the nursing notes.  Pertinent labs & imaging results that were available during my care of the patient were reviewed by me and considered in my medical decision making (see chart for details).    Julian Atkinson is a 51 y.o. male who presents to ED for abscess requiring incision and drainage. I&D performed per procedure note above. Patient tolerated  the procedure well.   No evidence of surrounding erythema to suggest cellulitis. Patient was prescribed Bactrim. Wound care instructions discussed.  Wound check in 2-3 days. Return to ER if concern for spread of infection, increasing pain, fevers or other concerns. All questions answered.  Patient seen by and discussed with Dr. Juleen China who agrees with treatment plan.    Final Clinical Impressions(s) / ED Diagnoses   Final diagnoses:  Scrotal abscess    New Prescriptions New Prescriptions   SULFAMETHOXAZOLE-TRIMETHOPRIM (BACTRIM DS,SEPTRA DS) 800-160 MG TABLET    Take 1 tablet by mouth 2 (two) times daily.     Terissa Haffey, Chase Picket, PA-C 12/13/16 1033    Raeford Razor, MD 12/13/16 1044

## 2016-12-16 MED FILL — SULFAMETHOXAZOLE/TMP DS TAB: 800-160 | 7 days supply | Qty: 14 | Fill #0

## 2017-04-30 ENCOUNTER — Encounter: Payer: Self-pay | Admitting: Family Medicine

## 2017-04-30 ENCOUNTER — Ambulatory Visit (INDEPENDENT_AMBULATORY_CARE_PROVIDER_SITE_OTHER): Payer: Self-pay | Admitting: Family Medicine

## 2017-04-30 ENCOUNTER — Encounter (HOSPITAL_COMMUNITY): Payer: Self-pay

## 2017-04-30 ENCOUNTER — Ambulatory Visit (HOSPITAL_COMMUNITY)
Admission: RE | Admit: 2017-04-30 | Discharge: 2017-04-30 | Disposition: A | Discharge status: Home / Self Care | Payer: Self-pay | Source: Ambulatory Visit | Attending: Family Medicine | Admitting: Family Medicine

## 2017-04-30 VITALS — BP 139/76 | HR 69 | Temp 97.8°F | Resp 14 | Ht 69.0 in | Wt 227.0 lb

## 2017-04-30 DIAGNOSIS — H8143 Vertigo of central origin, bilateral: Secondary | ICD-10-CM

## 2017-04-30 DIAGNOSIS — I10 Essential (primary) hypertension: Secondary | ICD-10-CM

## 2017-04-30 DIAGNOSIS — R55 Syncope and collapse: Secondary | ICD-10-CM

## 2017-04-30 DIAGNOSIS — IMO0001 Reserved for inherently not codable concepts without codable children: Secondary | ICD-10-CM

## 2017-04-30 DIAGNOSIS — R42 Dizziness and giddiness: Secondary | ICD-10-CM

## 2017-04-30 DIAGNOSIS — E1165 Type 2 diabetes mellitus with hyperglycemia: Secondary | ICD-10-CM | POA: Insufficient documentation

## 2017-04-30 DIAGNOSIS — F17200 Nicotine dependence, unspecified, uncomplicated: Secondary | ICD-10-CM | POA: Insufficient documentation

## 2017-04-30 DIAGNOSIS — Z13220 Encounter for screening for lipoid disorders: Secondary | ICD-10-CM

## 2017-04-30 DIAGNOSIS — Z1159 Encounter for screening for other viral diseases: Secondary | ICD-10-CM

## 2017-04-30 DIAGNOSIS — H9192 Unspecified hearing loss, left ear: Secondary | ICD-10-CM

## 2017-04-30 DIAGNOSIS — K219 Gastro-esophageal reflux disease without esophagitis: Secondary | ICD-10-CM

## 2017-04-30 DIAGNOSIS — E1149 Type 2 diabetes mellitus with other diabetic neurological complication: Secondary | ICD-10-CM

## 2017-04-30 DIAGNOSIS — E119 Type 2 diabetes mellitus without complications: Secondary | ICD-10-CM | POA: Insufficient documentation

## 2017-04-30 HISTORY — DX: Nicotine dependence, unspecified, uncomplicated: F17.200

## 2017-04-30 LAB — POCT I-STAT CREATININE: Creatinine, Ser: 0.9 mg/dL (ref 0.61–1.24)

## 2017-04-30 LAB — COMPREHENSIVE METABOLIC PANEL
AG RATIO: 1.3 (calc) (ref 1.0–2.5)
ALT: 21 U/L (ref 9–46)
AST: 13 U/L (ref 10–35)
Albumin: 3.9 g/dL (ref 3.6–5.1)
Alkaline phosphatase (APISO): 65 U/L (ref 40–115)
BILIRUBIN TOTAL: 0.7 mg/dL (ref 0.2–1.2)
BUN: 11 mg/dL (ref 7–25)
CALCIUM: 9 mg/dL (ref 8.6–10.3)
CHLORIDE: 103 mmol/L (ref 98–110)
CO2: 21 mmol/L (ref 20–32)
Creat: 0.79 mg/dL (ref 0.70–1.33)
GLOBULIN: 2.9 g/dL (ref 1.9–3.7)
Glucose, Bld: 130 mg/dL — ABNORMAL HIGH (ref 65–99)
POTASSIUM: 4.1 mmol/L (ref 3.5–5.3)
SODIUM: 137 mmol/L (ref 135–146)
TOTAL PROTEIN: 6.8 g/dL (ref 6.1–8.1)

## 2017-04-30 LAB — LIPID PANEL
CHOL/HDL RATIO: 6.7 (calc) — AB (ref <5.0)
Cholesterol: 167 mg/dL (ref <200)
HDL: 25 mg/dL — AB (ref >40)
LDL Cholesterol (Calc): 89 mg/dL (calc)
NON-HDL CHOLESTEROL (CALC): 142 mg/dL — AB (ref <130)
Triglycerides: 399 mg/dL — ABNORMAL HIGH (ref <150)

## 2017-04-30 LAB — POCT GLYCOSYLATED HEMOGLOBIN (HGB A1C): Hemoglobin A1C: 7.3

## 2017-04-30 LAB — GLUCOSE, CAPILLARY: Glucose-Capillary: 153 mg/dL — ABNORMAL HIGH (ref 65–99)

## 2017-04-30 MED ORDER — IOPAMIDOL (ISOVUE-300) INJECTION 61%
INTRAVENOUS | Status: AC
  Administered 2017-04-30: 75 mL via INTRAVENOUS
  Filled 2017-04-30: qty 75

## 2017-04-30 MED ORDER — MECLIZINE HCL 25 MG PO TABS: 25.0000 mg | ORAL_TABLET | Freq: Three times a day (TID) | ORAL | 1 refills | Status: DC | PRN

## 2017-04-30 MED ORDER — HYDROCHLOROTHIAZIDE 12.5 MG PO TABS: 12.5000 mg | ORAL_TABLET | Freq: Every day | ORAL | 5 refills | Status: DC

## 2017-04-30 MED ORDER — LORAZEPAM 1 MG PO TABS: 1.0000 mg | ORAL_TABLET | ORAL | 0 refills | Status: DC

## 2017-04-30 MED ORDER — IOPAMIDOL (ISOVUE-300) INJECTION 61%
75.0000 mL | Freq: Once | INTRAVENOUS | Status: AC | PRN
  Administered 2017-04-30: 75 mL via INTRAVENOUS

## 2017-04-30 MED ORDER — OMEPRAZOLE 20 MG PO CPDR: 20.0000 mg | DELAYED_RELEASE_CAPSULE | Freq: Every day | ORAL | 5 refills | Status: DC

## 2017-04-30 MED ORDER — LORAZEPAM 1 MG PO TABS: 1.0000 mg | ORAL_TABLET | Freq: Once | ORAL | 0 refills | Status: AC

## 2017-04-30 MED ORDER — METFORMIN HCL 500 MG PO TABS: 500.0000 mg | ORAL_TABLET | Freq: Two times a day (BID) | ORAL | 5 refills | Status: DC

## 2017-04-30 MED FILL — ?METFORMIN HCL 500MG TABLET: 500 | 30 days supply | Qty: 60 | Fill #0

## 2017-04-30 MED FILL — ?OMEPRAZOLE DR 20 MG CAPSUL: 20 | 30 days supply | Qty: 30 | Fill #0

## 2017-04-30 MED FILL — MECLIZINE 25 MG TABLET: 25 | 30 days supply | Qty: 60 | Fill #0

## 2017-04-30 MED FILL — ?HYDROCHLOROTHIAZIDE 12.5MG: 12.5 | 30 days supply | Qty: 30 | Fill #0

## 2017-04-30 NOTE — Patient Instructions (Addendum)
1. Type 2 diabetes mellitus: Your hemoglobin a1c is 7.3. Goal is < 7.  Metformin 500 mg twice daily with meals (breakfast and dinner.  Your A1C goal is less than 7. Your fasting blood sugar  Upon awakening goal is between 110-140.  Your LDL  (bad cholesterol goal is less than 100 Blood pressure goal is <140/90.  Recommend a lowfat, low carbohydrate diet divided over 5-6 small meals, increase water intake to 6-8 glasses, and 150 minutes per week of cardiovascular exercise.   Take your medications as prescribed Make sure that you are familiar with each one of your medications and what they are used to treat.  If you are unsure of medications, please bring to follow up Will send referral for eye exam  Please keep your scheduled follow up appointment.  2. Hypertension (high blood pressure) Goal is <140/90  Hydrochlorothiazide 12.5 mg daily  3. Vertigo Antivert (meclizine) three times daily as needed Scheduling a CT of head You will take Ativan 1 mg 30 minutes prior to CT scan   Make sure that you complete financial assistance forms.        Diabetes Mellitus and Food It is important for you to manage your blood sugar (glucose) level. Your blood glucose level can be greatly affected by what you eat. Eating healthier foods in the appropriate amounts throughout the day at about the same time each day will help you control your blood glucose level. It can also help slow or prevent worsening of your diabetes mellitus. Healthy eating may even help you improve the level of your blood pressure and reach or maintain a healthy weight. General recommendations for healthful eating and cooking habits include: Eating meals and snacks regularly. Avoid going long periods of time without eating to lose weight. Eating a diet that consists mainly of plant-based foods, such as fruits, vegetables, nuts, legumes, and whole grains. Using low-heat cooking methods, such as baking, instead of high-heat cooking  methods, such as deep frying.  Work with your dietitian to make sure you understand how to use the Nutrition Facts information on food labels. How can food affect me? Carbohydrates Carbohydrates affect your blood glucose level more than any other type of food. Your dietitian will help you determine how many carbohydrates to eat at each meal and teach you how to count carbohydrates. Counting carbohydrates is important to keep your blood glucose at a healthy level, especially if you are using insulin or taking certain medicines for diabetes mellitus. Alcohol Alcohol can cause sudden decreases in blood glucose (hypoglycemia), especially if you use insulin or take certain medicines for diabetes mellitus. Hypoglycemia can be a life-threatening condition. Symptoms of hypoglycemia (sleepiness, dizziness, and disorientation) are similar to symptoms of having too much alcohol. If your health care provider has given you approval to drink alcohol, do so in moderation and use the following guidelines: Women should not have more than one drink per day, and men should not have more than two drinks per day. One drink is equal to: 12 oz of beer. 5 oz of wine. 1 oz of hard liquor. Do not drink on an empty stomach. Keep yourself hydrated. Have water, diet soda, or unsweetened iced tea. Regular soda, juice, and other mixers might contain a lot of carbohydrates and should be counted.  What foods are not recommended? As you make food choices, it is important to remember that all foods are not the same. Some foods have fewer nutrients per serving than other foods, even though  they might have the same number of calories or carbohydrates. It is difficult to get your body what it needs when you eat foods with fewer nutrients. Examples of foods that you should avoid that are high in calories and carbohydrates but low in nutrients include: Trans fats (most processed foods list trans fats on the Nutrition Facts  label). Regular soda. Juice. Candy. Sweets, such as cake, pie, doughnuts, and cookies. Fried foods.  What foods can I eat? Eat nutrient-rich foods, which will nourish your body and keep you healthy. The food you should eat also will depend on several factors, including: The calories you need. The medicines you take. Your weight. Your blood glucose level. Your blood pressure level. Your cholesterol level.  You should eat a variety of foods, including: Protein. Lean cuts of meat. Proteins low in saturated fats, such as fish, egg whites, and beans. Avoid processed meats. Fruits and vegetables. Fruits and vegetables that may help control blood glucose levels, such as apples, mangoes, and yams. Dairy products. Choose fat-free or low-fat dairy products, such as milk, yogurt, and cheese. Grains, bread, pasta, and rice. Choose whole grain products, such as multigrain bread, whole oats, and brown rice. These foods may help control blood pressure. Fats. Foods containing healthful fats, such as nuts, avocado, olive oil, canola oil, and fish.  Does everyone with diabetes mellitus have the same meal plan? Because every person with diabetes mellitus is different, there is not one meal plan that works for everyone. It is very important that you meet with a dietitian who will help you create a meal plan that is just right for you. This information is not intended to replace advice given to you by your health care provider. Make sure you discuss any questions you have with your health care provider. Document Released: 04/03/2005 Document Revised: 12/13/2015 Document Reviewed: 06/03/2013 Elsevier Interactive Patient Education  2017 [employer]. Hypoglycemia Hypoglycemia is when the sugar (glucose) level in the blood is too low. Symptoms of low blood sugar may include: Feeling: Hungry. Worried or nervous (anxious). Sweaty and clammy. Confused. Dizzy. Sleepy. Sick to your stomach  (nauseous). Having: A fast heartbeat. A headache. A change in your vision. Jerky movements that you cannot control (seizure). Nightmares. Tingling or no feeling (numbness) around the mouth, lips, or tongue. Having trouble with: Talking. Paying attention (concentrating). Moving (coordination). Sleeping. Shaking. Passing out (fainting). Getting upset easily (irritability).  Low blood sugar can happen to people who have diabetes and people who do not have diabetes. Low blood sugar can happen quickly, and it can be an emergency. Treating Low Blood Sugar Low blood sugar is often treated by eating or drinking something sugary right away. If you can think clearly and swallow safely, follow the 15:15 rule: Take 15 grams of a fast-acting carb (carbohydrate). Some fast-acting carbs are: 1 tube of glucose gel. 3 sugar tablets (glucose pills). 6-8 pieces of hard candy. 4 oz (120 mL) of fruit juice. 4 oz (120 mL) of regular (not diet) soda. Check your blood sugar 15 minutes after you take the carb. If your blood sugar is still at or below 70 mg/dL (3.9 mmol/L), take 15 grams of a carb again. If your blood sugar does not go above 70 mg/dL (3.9 mmol/L) after 3 tries, get help right away. After your blood sugar goes back to normal, eat a meal or a snack within 1 hour.  Treating Very Low Blood Sugar If your blood sugar is at or below 54 mg/dL (3  mmol/L), you have very low blood sugar (severe hypoglycemia). This is an emergency. Do not wait to see if the symptoms will go away. Get medical help right away. Call your local emergency services (911 in the [place] Do not drive yourself to the hospital. If you have very low blood sugar and you cannot eat or drink, you may need a glucagon shot (injection). A family member or friend should learn how to check your blood sugar and how to give you a glucagon shot. Ask your doctor if you need to have a glucagon shot kit at home. Follow these instructions at  home: General instructions Avoid any diets that cause you to not eat enough food. Talk with your doctor before you start any new diet. Take over-the-counter and prescription medicines only as told by your doctor. Limit alcohol to no more than 1 drink per day for nonpregnant women and 2 drinks per day for men. One drink equals 12 oz of beer, 5 oz of wine, or 1 oz of hard liquor. Keep all follow-up visits as told by your doctor. This is important. If You Have Diabetes:  Make sure you know the symptoms of low blood sugar. Always keep a source of sugar with you, such as: Sugar. Sugar tablets. Glucose gel. Fruit juice. Regular soda (not diet soda). Milk. Hard candy. Honey. Take your medicines as told. Follow your exercise and meal plan. Eat on time. Do not skip meals. Follow your sick day plan when you cannot eat or drink normally. Make this plan ahead of time with your doctor. Check your blood sugar as often as told by your doctor. Always check before and after exercise. Share your diabetes care plan with: Your work or school. People you live with. Check your pee (urine) for ketones: When you are sick. As told by your doctor. Carry a card or wear jewelry that says you have diabetes. If You Have Low Blood Sugar From Other Causes:  Check your blood sugar as often as told by your doctor. Follow instructions from your doctor about what you cannot eat or drink. Contact a doctor if: You have trouble keeping your blood sugar in your target range. You have low blood sugar often. Get help right away if: You still have symptoms after you eat or drink something sugary. Your blood sugar is at or below 54 mg/dL (3 mmol/L). You have jerky movements that you cannot control. You pass out. These symptoms may be an emergency. Do not wait to see if the symptoms will go away. Get medical help right away. Call your local emergency services (911 in the [place] Do not drive yourself to the  hospital. This information is not intended to replace advice given to you by your health care provider. Make sure you discuss any questions you have with your health care provider. Document Released: 10/01/2009 Document Revised: 12/13/2015 Document Reviewed: 08/10/2015 Elsevier Interactive Patient Education  2018 [employer]. Hyperglycemia Hyperglycemia is when the sugar (glucose) level in your blood is too high. It may not cause symptoms. If you do have symptoms, they may include warning signs, such as:  Feeling more thirsty than normal.  Hunger.  Feeling tired.  Needing to pee (urinate) more than normal.  Blurry eyesight (vision).  You may get other symptoms as it gets worse, such as:  Dry mouth.  Not being hungry (loss of appetite).  Fruity-smelling breath.  Weakness.  Weight gain or loss that is not planned. Weight loss may be fast.  A  tingling or numb feeling in your hands or feet.  Headache.  Skin that does not bounce back quickly when it is lightly pinched and released (poor skin turgor).  Pain in your belly (abdomen).  Cuts or bruises that heal slowly.  High blood sugar can happen to people who do or do not have diabetes. High blood sugar can happen slowly or quickly, and it can be an emergency. Follow these instructions at home: General instructions  Take over-the-counter and prescription medicines only as told by your doctor.  Do not use products that contain nicotine or tobacco, such as cigarettes and e-cigarettes. If you need help quitting, ask your doctor.  Limit alcohol intake to no more than 1 drink per day for nonpregnant women and 2 drinks per day for men. One drink equals 12 oz of beer, 5 oz of wine, or 1 oz of hard liquor.  Manage stress. If you need help with this, ask your doctor.  Keep all follow-up visits as told by your doctor. This is important. Eating and drinking  Stay at a healthy weight.  Exercise regularly, as told by your  doctor.  Drink enough fluid, especially when you: ? Exercise. ? Get sick. ? Are in hot temperatures.  Eat healthy foods, such as: ? Low-fat (lean) proteins. ? Complex carbs (complex carbohydrates), such as whole wheat bread or brown rice. ? Fresh fruits and vegetables. ? Low-fat dairy products. ? Healthy fats.  Drink enough fluid to keep your pee (urine) clear or pale yellow. If you have diabetes:  Make sure you know the symptoms of hyperglycemia.  Follow your diabetes management plan, as told by your doctor. Make sure you: ? Take insulin and medicines as told. ? Follow your exercise plan. ? Follow your meal plan. Eat on time. Do not skip meals. ? Check your blood sugar as often as told. Make sure to check before and after exercise. If you exercise longer or in a different way than you normally do, check your blood sugar more often. ? Follow your sick day plan whenever you cannot eat or drink normally. Make this plan ahead of time with your doctor.  Share your diabetes management plan with people in your workplace, school, and household.  Check your urine for ketones when you are ill and as told by your doctor.  Carry a card or wear jewelry that says that you have diabetes. Contact a doctor if:  Your blood sugar level is higher than 240 mg/dL (13.3 mmol/L) for 2 days in a row.  You have problems keeping your blood sugar in your target range.  High blood sugar happens often for you. Get help right away if:  You have trouble breathing.  You have a change in how you think, feel, or act (mental status).  You feel sick to your stomach (nauseous), and that feeling does not go away.  You cannot stop throwing up (vomiting). These symptoms may be an emergency. Do not wait to see if the symptoms will go away. Get medical help right away. Call your local emergency services (911 in the [place] Do not drive yourself to the hospital. Summary  Hyperglycemia is when the sugar  (glucose) level in your blood is too high.  High blood sugar can happen to people who do or do not have diabetes.  Make sure you drink enough fluids, eat healthy foods, and exercise regularly.  Contact your doctor if you have problems keeping your blood sugar in your target range. This information  is not intended to replace advice given to you by your health care provider. Make sure you discuss any questions you have with your health care provider. Document Released: 05/04/2009 Document Revised: 03/24/2016 Document Reviewed: 03/24/2016 Elsevier Interactive Patient Education  2017 [employer].

## 2017-04-30 NOTE — Progress Notes (Signed)
Subjective:    Patient ID: Julian Atkinson, male    DOB: [birth year], 51 y.o.   MRN: [id number]  HPI Julian Atkinson, a 51 year old male presents accompanied by wife to establish care. Patient has not had a primary provider and has primarily been using the emergency department for all primary needs. He is primarily complaining of worsening dizziness over the past several weeks. He says that he has been unable to work due to vertigo. 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  Vertigo often occurs at rest. Julian Atkinson was evaluated in the emergency department on 11/05/2016 for similar symptoms, and treated with Meclizine.  Symptoms are exacerbated by standing quickly.  The patient also complains of hearing loss. Patient denies otalgia, otorrhea, and tinnitus. Patient has a history of a head injury around age 31 resulting in hearing loss to left ear. He has not been routinely followed by ENT.  Julian Atkinson is a chronic everyday smoker. He has been smoking greater than 40 years. He continues to smoke 1 pack per day.  [name] complains of heartburn. This has been associated with belching, cough, early satiety, heartburn and midespigastric pain.  He denies hoarseness, laryngitis, melena and regurgitation of undigested food. Symptoms have been present for several months. He denies dysphagia.  He has not lost weight. He denies melena, hematochezia, hematemesis, and coffee ground emesis. He has been taking Tums over the counter without sustained relief.   Past Medical History:  Diagnosis Date  . GERD (gastroesophageal reflux disease)   . Hypertension   . Migraines   . Renal disorder    Social History   Social History  . Marital status: Divorced    Spouse name: N/A  . Number of children: N/A  . Years of education: N/A   Occupational History  . Not on file.   Social History Main Topics  . Smoking status: Current Every Day Smoker    Packs/day: 1.00    Years: 30.00    Types:  Cigarettes  . Smokeless tobacco: Never Used  . Alcohol use No  . Drug use: Yes    Types: Marijuana     Comment: one or twice a week  . Sexual activity: Not on file   Other Topics Concern  . Not on file   Social History Narrative  . No narrative on file   There is no immunization history on file for this patient. Review of Systems  Constitutional: Positive for fatigue.  HENT: Negative.   Respiratory: Positive for cough and shortness of breath.   Cardiovascular: Negative.  Negative for chest pain, palpitations and leg swelling.  Gastrointestinal: Negative.   Endocrine: Negative.  Negative for polydipsia, polyphagia and polyuria.  Genitourinary: Negative.   Musculoskeletal: Negative.   Allergic/Immunologic: Negative for immunocompromised state.  Neurological: Positive for dizziness, syncope and numbness. Negative for tremors, facial asymmetry, weakness, light-headedness and headaches.  Hematological: Negative.   Psychiatric/Behavioral: Negative.        Objective:   Physical Exam  Constitutional: He is oriented to person, place, and time. He appears well-developed and well-nourished.  HENT:  Head: Normocephalic and atraumatic.  Right Ear: External ear normal.  Left Ear: External ear normal.  Nose: Nose normal.  Mouth/Throat: Oropharynx is clear and moist.  Eyes: Pupils are equal, round, and reactive to light. Conjunctivae and EOM are normal.  Neck: Normal range of motion. Neck supple.  Pulmonary/Chest: Effort normal and breath sounds normal.  Abdominal: Soft. Bowel  sounds are normal.  Neurological: He is alert and oriented to person, place, and time. He displays no tremor. A sensory deficit is present. He exhibits normal muscle tone. Coordination and gait normal.  Reflex Scores:      Tricep reflexes are 2+ on the right side and 2+ on the left side.      Bicep reflexes are 2+ on the right side and 2+ on the left side.      Brachioradialis reflexes are 2+ on the right side  and 2+ on the left side.      Patellar reflexes are 2+ on the right side and 2+ on the left side.      Achilles reflexes are 2+ on the right side and 2+ on the left side. Numbness to left upper extremity  Dizziness reproducible  Skin: Skin is warm and dry.  Psychiatric: He has a normal mood and affect. His behavior is normal. Judgment and thought content normal.      BP 139/76 (BP Location: Right Arm, Patient Position: Sitting, Cuff Size: Large)   Pulse 69   Temp 97.8 F (36.6 C) (Oral)   Resp 14   Ht  (1.753 m)   Wt 227 lb (103 kg)   SpO2 95%   BMI 33.52 kg/m  Assessment & Plan:  1. Vertigo - CT HEAD W & WO CONTRAST; Future - meclizine (ANTIVERT) 25 MG tablet; Take 1 tablet (25 mg total) by mouth 3 (three) times daily as needed for dizziness.  Dispense: 60 tablet; Refill: 1 - LORazepam (ATIVAN) 1 MG tablet; Take 1 tablet (1 mg total) by mouth once.  Dispense: 1 tablet; Refill: 0  2. Syncope, unspecified syncope type EKG-Normal sinus rhythm Orthostatic blood pressures unremarkable Will review CT of head, take Ativan 1 mg 30 minutes prior to CT exam - EKG 12-Lead - Glucose (CBG) - POCT A1C - CBC with Differential - Comprehensive metabolic panel - Orthostatic vital signs - CT HEAD W & WO CONTRAST; Future - LORazepam (ATIVAN) 1 MG tablet; Take 1 tablet (1 mg total) by mouth once.  Dispense: 1 tablet; Refill: 0 - I-Stat Creatinine  3. Acquired deafness of left ear Check hearing per audiometer, left ear deafness.   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  - CT HEAD W & WO CONTRAST; Future  5. Screening cholesterol level -lipid panel   6. Tobacco dependence Smoking cessation instruction/counseling given:  counseled patient on the dangers of tobacco use, advised patient to stop smoking, and reviewed strategies to maximize success  7. Need for hepatitis C screening test - Hepatitis C Antibody  8. Type 2 diabetes  mellitus with other neurologic complication, without long-term current use of insulin (HCC) Hemoglobin a1C is 7.3, which is consistent with type 2 diabetes mellitus.  Recommend a carbohydrate modified diet - Glucose, capillary - metFORMIN (GLUCOPHAGE) 500 MG tablet; Take 1 tablet (500 mg total) by mouth 2 (two) times daily with a meal.  Dispense: 60 tablet; Refill: 5  9. Essential hypertension Blood pressure is above goal. Will start HCTZ at 12.5 mg daily.  - hydrochlorothiazide (HYDRODIURIL) 12.5 MG tablet; Take 1 tablet (12.5 mg total) by mouth daily.  Dispense: 30 tablet; Refill: 5  10. Gastroesophageal reflux disease without esophagitis - omeprazole (PRILOSEC) 20 MG capsule; Take 1 capsule (20 mg total) by mouth daily.  Dispense: 30 capsule; Refill: 5   RTC: 1 week for bp check, and 1 month for DMII.  Will follow up by phone following stat CT exam   [name]  MSN, FNP-C Patient Care [facility] [address] [place], [place] [address] [phone]

## 2017-05-01 ENCOUNTER — Encounter: Payer: Self-pay | Admitting: Family Medicine

## 2017-05-01 ENCOUNTER — Other Ambulatory Visit: Payer: Self-pay | Admitting: Family Medicine

## 2017-05-01 ENCOUNTER — Telehealth: Payer: Self-pay

## 2017-05-01 DIAGNOSIS — E781 Pure hyperglyceridemia: Secondary | ICD-10-CM

## 2017-05-01 LAB — CBC WITH DIFFERENTIAL/PLATELET
BASOS ABS: 62 {cells}/uL (ref 0–200)
BASOS PCT: 0.7 %
EOS ABS: 116 {cells}/uL (ref 15–500)
EOS PCT: 1.3 %
HEMATOCRIT: 46.3 % (ref 38.5–50.0)
HEMOGLOBIN: 16.3 g/dL (ref 13.2–17.1)
LYMPHS ABS: 3961 {cells}/uL — AB (ref 850–3900)
MCH: 31.2 pg (ref 27.0–33.0)
MCHC: 35.2 g/dL (ref 32.0–36.0)
MCV: 88.5 fL (ref 80.0–100.0)
MPV: 13.4 fL — AB (ref 7.5–12.5)
Monocytes Relative: 6.2 %
NEUTROS ABS: 4210 {cells}/uL (ref 1500–7800)
Neutrophils Relative %: 47.3 %
Platelets: 198 10*3/uL (ref 140–400)
RBC: 5.23 10*6/uL (ref 4.20–5.80)
RDW: 12.7 % (ref 11.0–15.0)
Total Lymphocyte: 44.5 %
WBC mixed population: 552 cells/uL (ref 200–950)
WBC: 8.9 10*3/uL (ref 3.8–10.8)

## 2017-05-01 LAB — HEPATITIS C ANTIBODY
Hepatitis C Ab: NONREACTIVE
SIGNAL TO CUT-OFF: 0.02 (ref <1.00)

## 2017-05-01 MED ORDER — FENOFIBRATE 145 MG PO TABS: 145.0000 mg | ORAL_TABLET | Freq: Every day | ORAL | 11 refills | Status: DC

## 2017-05-01 NOTE — Telephone Encounter (Signed)
-----   Message from Massie Maroon, [place] sent at 05/01/2017 11:51 AM EDT ----- Regarding: lab results Reviewed CT scan , results unremarkable.  I suspect that dizziness is related to inner ear. Will send a referral to ENT and vestibular rehab as payer source becomes available.   Also, cholesterol is elevate. Will add tricor 145 mg with evening meals. Recommend a lowfat, low carb diet as discussed during appointment. Also, smoking cessation is recommended.   Thanks

## 2017-05-01 NOTE — Telephone Encounter (Signed)
Spoke to patient and his wife, advised that ct results were unremarkable. Advised that provider suspects that dizziness is related to the inner ear and we will refer him to ENT as soon as he has a payer source. Advised that cholesterol is elevated and we will start him on tricor  with evening meals. Recommended that he eat a lowfat/low carb diet and work on quitting smoking. Verbalized understanding and had no questions at this time. Thanks!

## 2017-05-01 NOTE — Progress Notes (Signed)
Reviewed CT scan, results are as follows.   FINDINGS: Brain: Cerebral volume is stable and normal. No midline shift, ventriculomegaly, mass effect, evidence of mass lesion, intracranial hemorrhage or evidence of cortically based acute infarction. Gray-white matter differentiation is within normal limits throughout the brain. No encephalomalacia identified. No abnormal enhancement identified.  Vascular: Mild Calcified atherosclerosis at the skull base. Major intracranial vascular structures appear to be normally enhancing.  Skull: Stable and negative.  Sinuses/Orbits: Visualized paranasal sinuses and mastoids are stable and well pneumatized.  The bilateral tympanic cavities and mastoid air cells remain clear. The left temporal bone structures are grossly normal.  Other: Visualized orbits and scalp soft tissues are within normal limits.  IMPRESSION: Stable since 2014 and normal CT appearance of the brain   I suspect that dizziness is related to inner ear. Will send a referral to ENT and vestibular rehab as financial resources become available.    [name]  MSN, FNP-C Patient Care [facility] [address] [place], [place] [address] [phone]

## 2017-05-07 ENCOUNTER — Ambulatory Visit: Payer: Self-pay

## 2017-05-07 ENCOUNTER — Encounter: Payer: Self-pay | Admitting: Family Medicine

## 2017-05-07 VITALS — BP 122/72

## 2017-05-07 DIAGNOSIS — I10 Essential (primary) hypertension: Secondary | ICD-10-CM

## 2017-05-07 NOTE — Progress Notes (Signed)
Mr. Julian Atkinson, a 51 year old male with a history of hypertension, DMII and hyperlipidemia presented for a blood pressure check. Blood pressure is at goal, no changes warranted. Will follow up in 1 month as scheduled.    Nolon NationsLaChina Moore Dorcus Riga  MSN, FNP-C Patient Care [facility] [address]  [place], [place] [id number] [phone]

## 2017-05-15 MED FILL — ?FENOFIBRATE 145 MG TABS: 145 | 30 days supply | Qty: 30 | Fill #0

## 2017-05-26 MED FILL — MECLIZINE 25 MG TABLET: 25 | 30 days supply | Qty: 60 | Fill #1

## 2017-05-26 MED FILL — ?HYDROCHLOROTHIAZIDE 12.5MG: 12.5 | 30 days supply | Qty: 30 | Fill #1

## 2017-05-26 MED FILL — ?OMEPRAZOLE DR 20 MG CAPSUL: 20 | 30 days supply | Qty: 30 | Fill #1

## 2017-05-26 MED FILL — ?METFORMIN HCL 500MG TABLET: 500 | 30 days supply | Qty: 60 | Fill #1

## 2017-06-01 ENCOUNTER — Ambulatory Visit (INDEPENDENT_AMBULATORY_CARE_PROVIDER_SITE_OTHER): Payer: Self-pay | Admitting: Family Medicine

## 2017-06-01 ENCOUNTER — Encounter: Payer: Self-pay | Admitting: Family Medicine

## 2017-06-01 VITALS — BP 120/69 | HR 79 | Temp 98.2°F | Resp 16 | Ht 69.0 in | Wt 224.0 lb

## 2017-06-01 DIAGNOSIS — I10 Essential (primary) hypertension: Secondary | ICD-10-CM

## 2017-06-01 DIAGNOSIS — J449 Chronic obstructive pulmonary disease, unspecified: Secondary | ICD-10-CM

## 2017-06-01 DIAGNOSIS — E1149 Type 2 diabetes mellitus with other diabetic neurological complication: Secondary | ICD-10-CM

## 2017-06-01 DIAGNOSIS — F17200 Nicotine dependence, unspecified, uncomplicated: Secondary | ICD-10-CM

## 2017-06-01 HISTORY — DX: Chronic obstructive pulmonary disease, unspecified: J44.9

## 2017-06-01 LAB — POCT GLYCOSYLATED HEMOGLOBIN (HGB A1C): Hemoglobin A1C: 6.8

## 2017-06-01 LAB — POCT URINALYSIS DIP (DEVICE)
BILIRUBIN URINE: NEGATIVE
GLUCOSE, UA: NEGATIVE mg/dL
HGB URINE DIPSTICK: NEGATIVE
Ketones, ur: NEGATIVE mg/dL
LEUKOCYTES UA: NEGATIVE
NITRITE: NEGATIVE
Protein, ur: NEGATIVE mg/dL
SPECIFIC GRAVITY, URINE: 1.025 (ref 1.005–1.030)
Urobilinogen, UA: 1 mg/dL (ref 0.0–1.0)
pH: 5.5 (ref 5.0–8.0)

## 2017-06-01 MED ORDER — ALBUTEROL SULFATE HFA 108 (90 BASE) MCG/ACT IN AERS: 2.0000 | INHALATION_SPRAY | Freq: Four times a day (QID) | RESPIRATORY_TRACT | 11 refills | Status: DC | PRN

## 2017-06-01 MED FILL — !VENTOLIN HFA INHALER: 108 (90 BAS | 25 days supply | Qty: 18 | Fill #0

## 2017-06-01 NOTE — Progress Notes (Signed)
Subjective:    Patient ID: Julian Atkinson, male    DOB: 08/03/1965, 51 y.o.   MRN: [id number]  HPI Julian Atkinson, a 51 year old male presents accompanied by wife for follow-up of type 2 diabetes and hypertension.  Patient was diagnosed with type 2 diabetes 1 month ago. His hemoglobin A1c at the time was 7.3, Julian Atkinson was started on metformin 500 mg twice daily.  Julian Atkinson states that Julian Atkinson has been taking medication consistently and has started a carbohydrate modified diet.  Julian Atkinson is not exercising routinely but has increased his daily activity level.  Julian Atkinson denies headache, dizziness, nausea, vomiting, paresthesias, polyuria, polydipsia or polyphagia.  Julian Atkinson says that symptoms of vertigo has improved since Julian Atkinson started metformin and change diet. Patient also has a history of hypertension.  Julian Atkinson was started on hydrochlorothiazide 12.5 mg 1 month ago.  Julian Atkinson is not been checking blood pressures at home.  Patient came in for blood pressure check 1 week after starting medication, blood pressure was within normal range.    Past Medical History:  Diagnosis Date  . GERD (gastroesophageal reflux disease)   . Hypertension   . Migraines   . Renal disorder    Social History   Socioeconomic History  . Marital status: Divorced    Spouse name: Not on file  . Number of children: Not on file  . Years of education: Not on file  . Highest education level: Not on file  Social Needs  . Financial resource strain: Not on file  . Food insecurity - worry: Not on file  . Food insecurity - inability: Not on file  . Transportation needs - medical: Not on file  . Transportation needs - non-medical: Not on file  Occupational History  . Not on file  Tobacco Use  . Smoking status: Current Every Day Smoker    Packs/day: 1.00    Years: 30.00    Pack years: 30.00    Types: Cigarettes  . Smokeless tobacco: Never Used  Substance and Sexual Activity  . Alcohol use: No  . Drug use: Yes    Types: Marijuana    Comment: one or twice a  week  . Sexual activity: Not on file  Other Topics Concern  . Not on file  Social History Narrative  . Not on file   There is no immunization history on file for this patient. Review of Systems  HENT: Negative.   Cardiovascular: Negative.  Negative for chest pain, palpitations and leg swelling.  Gastrointestinal: Negative.   Endocrine: Negative.  Negative for polydipsia, polyphagia and polyuria.  Genitourinary: Negative.   Musculoskeletal: Negative.   Allergic/Immunologic: Negative.  Negative for immunocompromised state.  Neurological: Positive for syncope. Negative for tremors, facial asymmetry, weakness, light-headedness and headaches.  Hematological: Negative.   Psychiatric/Behavioral: Negative.        Objective:   Physical Exam  Constitutional: Julian Atkinson is oriented to person, place, and time. Julian Atkinson appears well-developed and well-nourished.  HENT:  Head: Normocephalic and atraumatic.  Right Ear: External ear normal.  Left Ear: External ear normal.  Nose: Nose normal.  Mouth/Throat: Oropharynx is clear and moist.  Eyes: Conjunctivae and EOM are normal. Pupils are equal, round, and reactive to light.  Neck: Normal range of motion. Neck supple.  Cardiovascular: Normal rate, regular rhythm, normal heart sounds and intact distal pulses.  Pulmonary/Chest: Effort normal and breath sounds normal.  Abdominal: Soft. Bowel sounds are normal.  Neurological: Julian Atkinson is alert and oriented to person, place, and  time. Julian Atkinson displays no tremor. Julian Atkinson exhibits normal muscle tone. Coordination and gait normal.  Skin: Skin is warm and dry.  Psychiatric: Julian Atkinson has a normal mood and affect. His behavior is normal. Judgment and thought content normal.      BP 120/69 (BP Location: Left Arm, Patient Position: Sitting, Cuff Size: Large)   Pulse 79   Temp 98.2 F (36.8 C) (Oral)   Resp 16   Ht 5\' 9"  (1.753 m)   Wt 224 lb (101.6 kg)   SpO2 95%   BMI 33.08 kg/m  Assessment & Plan:  1. Type 2 diabetes mellitus  with other neurologic complication, without long-term current use of insulin (HCC) Hemoglobin A1c is 6.8, which is decreased from 7.3 onemonth ago.  Will continue metformin 500 mg twice daily.  Also recommend the patient continue carbohydrate modified diet as discussed - HgB A1c  2. Chronic obstructive pulmonary disease, unspecified COPD type (HCC) Smoking cessation recommended. - albuterol (PROVENTIL HFA;VENTOLIN HFA) 108 (90 Base) MCG/ACT inhaler; Inhale 2 puffs every 6 (six) hours as needed into the lungs for wheezing or shortness of breath.  Dispense: 1 each; Refill: 11  3. Tobacco dependence Smoking cessation instruction/counseling given:  counseled patient on the dangers of tobacco use, advised patient to stop smoking, and reviewed strategies to maximize success   4.  Essential hypertension Blood pressure is at goal on current medication regimen, no medication changes are warranted today.  Reviewed urinalysis no proteinuria present.   RTC: 3 months for DMII and hypertension   Nolon NationsLaChina Moore Salathiel Ferrara  MSN, FNP-C [facility] [address]  [place], [place] [address] [phone]  The patient was given clear instructions to go to ER or return to medical center if symptoms do not improve, worsen or new problems develop. The patient verbalized understanding.

## 2017-06-01 NOTE — Patient Instructions (Signed)
Your hemoglobin a1C has decreased from 7.3 to 6.8.  The goal is for the hemoglobin A1c to be less than 7.  We will continue metformin 500 mg twice daily. Your blood pressure is at goal on current medication regimen, no changes warranted at this time. We will follow-up in 3 months for chronic conditions.  Please continue a carbohydrate modified diet over the holidays.  I will attached some information on carbohydrate counting Carbohydrate Counting for Diabetes Mellitus, Adult Carbohydrate counting is a method for keeping track of how many carbohydrates you eat. Eating carbohydrates naturally increases the amount of sugar (glucose) in the blood. Counting how many carbohydrates you eat helps keep your blood glucose within normal limits, which helps you manage your diabetes (diabetes mellitus). It is important to know how many carbohydrates you can safely have in each meal. This is different for every person. A diet and nutrition specialist (registered dietitian) can help you make a meal plan and calculate how many carbohydrates you should have at each meal and snack. Carbohydrates are found in the following foods:  Grains, such as breads and cereals.  Dried beans and soy products.  Starchy vegetables, such as potatoes, peas, and corn.  Fruit and fruit juices.  Milk and yogurt.  Sweets and snack foods, such as cake, cookies, candy, chips, and soft drinks.  How do I count carbohydrates? There are two ways to count carbohydrates in food. You can use either of the methods or a combination of both. Reading "Nutrition Facts" on packaged food The "Nutrition Facts" list is included on the labels of almost all packaged foods and beverages in the [place] It includes:  The serving size.  Information about nutrients in each serving, including the grams (g) of carbohydrate per serving.  To use the "Nutrition Facts":  Decide how many servings you will have.  Multiply the number of servings by the  number of carbohydrates per serving.  The resulting number is the total amount of carbohydrates that you will be having.  Learning standard serving sizes of other foods When you eat foods containing carbohydrates that are not packaged or do not include "Nutrition Facts" on the label, you need to measure the servings in order to count the amount of carbohydrates:  Measure the foods that you will eat with a food scale or measuring cup, if needed.  Decide how many standard-size servings you will eat.  Multiply the number of servings by 15. Most carbohydrate-rich foods have about 15 g of carbohydrates per serving. ? For example, if you eat 8 oz (170 g) of strawberries, you will have eaten 2 servings and 30 g of carbohydrates (2 servings x 15 g = 30 g).  For foods that have more than one food mixed, such as soups and casseroles, you must count the carbohydrates in each food that is included.  The following list contains standard serving sizes of common carbohydrate-rich foods. Each of these servings has about 15 g of carbohydrates:   hamburger bun or  English muffin.   oz (15 mL) syrup.   oz (14 g) jelly.  1 slice of bread.  1 six-inch tortilla.  3 oz (85 g) cooked rice or pasta.  4 oz (113 g) cooked dried beans.  4 oz (113 g) starchy vegetable, such as peas, corn, or potatoes.  4 oz (113 g) hot cereal.  4 oz (113 g) mashed potatoes or  of a large baked potato.  4 oz (113 g) canned or frozen fruit.  4 oz (120 mL) fruit juice.  4-6 crackers.  6 chicken nuggets.  6 oz (170 g) unsweetened dry cereal.  6 oz (170 g) plain fat-free yogurt or yogurt sweetened with artificial sweeteners.  8 oz (240 mL) milk.  8 oz (170 g) fresh fruit or one small piece of fruit.  24 oz (680 g) popped popcorn.  Example of carbohydrate counting Sample meal  3 oz (85 g) chicken breast.  6 oz (170 g) brown rice.  4 oz (113 g) corn.  8 oz (240 mL) milk.  8 oz (170 g) strawberries  with sugar-free whipped topping. Carbohydrate calculation 1. Identify the foods that contain carbohydrates: ? Rice. ? Corn. ? Milk. ? Strawberries. 2. Calculate how many servings you have of each food: ? 2 servings rice. ? 1 serving corn. ? 1 serving milk. ? 1 serving strawberries. 3. Multiply each number of servings by 15 g: ? 2 servings rice x 15 g = 30 g. ? 1 serving corn x 15 g = 15 g. ? 1 serving milk x 15 g = 15 g. ? 1 serving strawberries x 15 g = 15 g. 4. Add together all of the amounts to find the total grams of carbohydrates eaten: ? 30 g + 15 g + 15 g + 15 g = 75 g of carbohydrates total. This information is not intended to replace advice given to you by your health care provider. Make sure you discuss any questions you have with your health care provider. Document Released: 07/07/2005 Document Revised: 01/25/2016 Document Reviewed: 05/31/2017Diabetes Mellitus and Food It is important for you to manage your blood sugar (glucose) level. Your blood glucose level can be greatly affected by what you eat. Eating healthier foods in the appropriate amounts throughout the day at about the same time each day will help you control your blood glucose level. It can also help slow or prevent worsening of your diabetes mellitus. Healthy eating may even help you improve the level of your blood pressure and reach or maintain a healthy weight. General recommendations for healthful eating and cooking habits include:  Eating meals and snacks regularly. Avoid going long periods of time without eating to lose weight.  Eating a diet that consists mainly of plant-based foods, such as fruits, vegetables, nuts, legumes, and whole grains.  Using low-heat cooking methods, such as baking, instead of high-heat cooking methods, such as deep frying.  Work with your dietitian to make sure you understand how to use the Nutrition Facts information on food labels. How can food affect  me? Carbohydrates Carbohydrates affect your blood glucose level more than any other type of food. Your dietitian will help you determine how many carbohydrates to eat at each meal and teach you how to count carbohydrates. Counting carbohydrates is important to keep your blood glucose at a healthy level, especially if you are using insulin or taking certain medicines for diabetes mellitus. Alcohol Alcohol can cause sudden decreases in blood glucose (hypoglycemia), especially if you use insulin or take certain medicines for diabetes mellitus. Hypoglycemia can be a life-threatening condition. Symptoms of hypoglycemia (sleepiness, dizziness, and disorientation) are similar to symptoms of having too much alcohol. If your health care provider has given you approval to drink alcohol, do so in moderation and use the following guidelines:  Women should not have more than one drink per day, and men should not have more than two drinks per day. One drink is equal to: ? 12 oz of beer. ? 5  oz of wine. ? 1 oz of hard liquor.  Do not drink on an empty stomach.  Keep yourself hydrated. Have water, diet soda, or unsweetened iced tea.  Regular soda, juice, and other mixers might contain a lot of carbohydrates and should be counted.  What foods are not recommended? As you make food choices, it is important to remember that all foods are not the same. Some foods have fewer nutrients per serving than other foods, even though they might have the same number of calories or carbohydrates. It is difficult to get your body what it needs when you eat foods with fewer nutrients. Examples of foods that you should avoid that are high in calories and carbohydrates but low in nutrients include:  Trans fats (most processed foods list trans fats on the Nutrition Facts label).  Regular soda.  Juice.  Candy.  Sweets, such as cake, pie, doughnuts, and cookies.  Fried foods.  What foods can I eat? Eat nutrient-rich  foods, which will nourish your body and keep you healthy. The food you should eat also will depend on several factors, including:  The calories you need.  The medicines you take.  Your weight.  Your blood glucose level.  Your blood pressure level.  Your cholesterol level.  You should eat a variety of foods, including:  Protein. ? Lean cuts of meat. ? Proteins low in saturated fats, such as fish, egg whites, and beans. Avoid processed meats.  Fruits and vegetables. ? Fruits and vegetables that may help control blood glucose levels, such as apples, mangoes, and yams.  Dairy products. ? Choose fat-free or low-fat dairy products, such as milk, yogurt, and cheese.  Grains, bread, pasta, and rice. ? Choose whole grain products, such as multigrain bread, whole oats, and brown rice. These foods may help control blood pressure.  Fats. ? Foods containing healthful fats, such as nuts, avocado, olive oil, canola oil, and fish.  Does everyone with diabetes mellitus have the same meal plan? Because every person with diabetes mellitus is different, there is not one meal plan that works for everyone. It is very important that you meet with a dietitian who will help you create a meal plan that is just right for you. This information is not intended to replace advice given to you by your health care provider. Make sure you discuss any questions you have with your health care provider. Document Released: 04/03/2005 Document Revised: 12/13/2015 Document Reviewed: 06/03/2013 Elsevier Interactive Patient Education  2017 [employer].  Risk analyst Patient Education  [employer].

## 2017-06-16 MED FILL — ?FENOFIBRATE 145 MG TABS: 145 | 30 days supply | Qty: 30 | Fill #1

## 2017-06-23 MED FILL — !VENTOLIN HFA INHALER: 108 (90 BAS | 25 days supply | Qty: 18 | Fill #1

## 2017-06-23 MED FILL — HYDROCHLOROTHIAZIDE 12.5 MG: 12.5 | 30 days supply | Qty: 30 | Fill #2

## 2017-06-23 MED FILL — ?OMEPRAZOLE DR 20MG CAPSULE: 20 | 30 days supply | Qty: 30 | Fill #2

## 2017-06-23 MED FILL — ?METFORMIN HCL 500MG TABLET: 500 | 30 days supply | Qty: 60 | Fill #2

## 2017-07-20 MED FILL — ?FENOFIBRATE 145 MG TABS: 145 | 30 days supply | Qty: 30 | Fill #2

## 2017-07-20 MED FILL — ?METFORMIN HCL 500MG TABLET: 500 | 30 days supply | Qty: 60 | Fill #3

## 2017-07-20 MED FILL — ?OMEPRAZOLE DR 20MG CAPSULE: 20 | 30 days supply | Qty: 30 | Fill #3

## 2017-07-20 MED FILL — !VENTOLIN HFA INHALER: 108 (90 BAS | 25 days supply | Qty: 18 | Fill #2

## 2017-07-20 MED FILL — HYDROCHLOROTHIAZIDE 12.5 MG: 12.5 | 30 days supply | Qty: 30 | Fill #3

## 2017-08-20 MED FILL — HYDROCHLOROTHIAZIDE 12.5 MG: 12.5 | 30 days supply | Qty: 30 | Fill #4

## 2017-08-20 MED FILL — ?OMEPRAZOLE DR 20MG CAPSULE: 20 | 30 days supply | Qty: 30 | Fill #4

## 2017-08-20 MED FILL — FENOFIBRATE 145 MG TABLET: 145 | 30 days supply | Qty: 30 | Fill #3

## 2017-08-20 MED FILL — ?METFORMIN HCL 500MG TABLET: 500 | 30 days supply | Qty: 60 | Fill #4

## 2017-09-02 ENCOUNTER — Encounter: Payer: Self-pay | Admitting: Family Medicine

## 2017-09-02 ENCOUNTER — Ambulatory Visit (INDEPENDENT_AMBULATORY_CARE_PROVIDER_SITE_OTHER): Payer: Self-pay | Admitting: Family Medicine

## 2017-09-02 VITALS — BP 111/71 | HR 76 | Temp 98.2°F | Resp 16 | Ht 69.0 in | Wt 220.0 lb

## 2017-09-02 DIAGNOSIS — E1149 Type 2 diabetes mellitus with other diabetic neurological complication: Secondary | ICD-10-CM

## 2017-09-02 DIAGNOSIS — R42 Dizziness and giddiness: Secondary | ICD-10-CM

## 2017-09-02 DIAGNOSIS — F17200 Nicotine dependence, unspecified, uncomplicated: Secondary | ICD-10-CM

## 2017-09-02 DIAGNOSIS — Z23 Encounter for immunization: Secondary | ICD-10-CM

## 2017-09-02 DIAGNOSIS — I10 Essential (primary) hypertension: Secondary | ICD-10-CM

## 2017-09-02 DIAGNOSIS — E781 Pure hyperglyceridemia: Secondary | ICD-10-CM

## 2017-09-02 LAB — POCT URINALYSIS DIP (DEVICE)
Bilirubin Urine: NEGATIVE
GLUCOSE, UA: NEGATIVE mg/dL
Hgb urine dipstick: NEGATIVE
Ketones, ur: NEGATIVE mg/dL
Leukocytes, UA: NEGATIVE
NITRITE: NEGATIVE
Protein, ur: NEGATIVE mg/dL
Specific Gravity, Urine: >=1.03 (ref 1.005–1.030)
UROBILINOGEN UA: 0.2 mg/dL (ref 0.0–1.0)
pH: 5.5 (ref 5.0–8.0)

## 2017-09-02 LAB — POCT GLYCOSYLATED HEMOGLOBIN (HGB A1C): HEMOGLOBIN A1C: 6.4

## 2017-09-02 MED ORDER — HYDROCHLOROTHIAZIDE 12.5 MG PO TABS: 12.5000 mg | ORAL_TABLET | Freq: Every day | ORAL | 5 refills | Status: DC

## 2017-09-02 MED ORDER — MECLIZINE HCL 25 MG PO TABS: 25.0000 mg | ORAL_TABLET | Freq: Three times a day (TID) | ORAL | 1 refills | Status: DC | PRN

## 2017-09-02 MED ORDER — FENOFIBRATE 145 MG PO TABS: 145.0000 mg | ORAL_TABLET | Freq: Every day | ORAL | 11 refills | Status: DC

## 2017-09-02 NOTE — Progress Notes (Signed)
Subjective:    Patient ID: Julian Atkinson, male    DOB: [birth year], 52 y.o.   MRN: [id number]  HPI Fawaz Borquez, a 52 year old male with a history of type 2 diabetes mellitus, hypertension, hyperlipidemia, and vertigo presents accompanied by wife for follow up. Patient has been taking all medications consistently and following a low fat diet, low carb diet over the past several months. Patient has also decreased weight by 10 pounds.    Patient denies polyuria, polyuria, polydipsia,  chest pain, dyspnea, fatigue, irregular heart beat, near-syncope, orthopnea, palpitations and tachypnea.  Cardiovascular risk factors: dyslipidemia, obesity (BMI >= 30 kg/m2), sedentary lifestyle and smoking/ tobacco exposure.  Past Medical History:  Diagnosis Date  . GERD (gastroesophageal reflux disease)   . Hypertension   . Migraines   . Renal disorder    Social History   Socioeconomic History  . Marital status: Divorced    Spouse name: Not on file  . Number of children: Not on file  . Years of education: Not on file  . Highest education level: Not on file  Social Needs  . Financial resource strain: Not on file  . Food insecurity - worry: Not on file  . Food insecurity - inability: Not on file  . Transportation needs - medical: Not on file  . Transportation needs - non-medical: Not on file  Occupational History  . Not on file  Tobacco Use  . Smoking status: Current Every Day Smoker    Packs/day: 1.00    Years: 30.00    Pack years: 30.00    Types: Cigarettes  . Smokeless tobacco: Never Used  Substance and Sexual Activity  . Alcohol use: No  . Drug use: Yes    Types: Marijuana    Comment: one or twice a week  . Sexual activity: Not on file  Other Topics Concern  . Not on file  Social History Narrative  . Not on file   Allergies  Allergen Reactions  . Codeine Nausea And Vomiting  . Penicillins Other (See Comments)    Childhood reaction   Immunization History  Administered  Date(s) Administered  . Tdap 09/02/2017     Review of Systems  Constitutional: Negative.   HENT: Negative.   Eyes: Negative.   Respiratory: Negative.  Negative for cough.   Cardiovascular: Negative.   Gastrointestinal: Negative.   Endocrine: Negative.   Genitourinary: Negative.  Negative for decreased urine volume, difficulty urinating, dysuria, testicular pain and urgency.  Musculoskeletal: Negative.   Skin: Negative.   Neurological: Positive for dizziness.  Hematological: Negative.   Psychiatric/Behavioral: Negative.        Objective:   Physical Exam  Constitutional: He is oriented to person, place, and time. He appears well-developed.  HENT:  Head: Normocephalic and atraumatic.  Right Ear: External ear normal.  Left Ear: External ear normal.  Mouth/Throat: Oropharynx is clear and moist.  Eyes: Conjunctivae are normal. Pupils are equal, round, and reactive to light.  Neck: Normal range of motion. Neck supple.  Cardiovascular: Normal rate, normal heart sounds and intact distal pulses.  Pulmonary/Chest: Effort normal and breath sounds normal.  Abdominal: Soft. Bowel sounds are normal.  Neurological: He is alert and oriented to person, place, and time. He has normal reflexes.  Skin: Skin is warm and dry.  Psychiatric: He has a normal mood and affect. His behavior is normal. Judgment and thought content normal.         BP 111/71 (BP Location: Left Arm, Patient  Position: Sitting, Cuff Size: Large)   Pulse 76   Temp 98.2 F (36.8 C) (Oral)   Resp 16   Ht 5\' 9"  (1.753 m)   Wt 220 lb (99.8 kg)   SpO2 95%   BMI 32.49 kg/m  Assessment & Plan:  1. Type 2 diabetes mellitus with other neurologic complication, without long-term current use of insulin (HCC) Hemoglobin a1c has decreased to 6.4, which is at goal.  Will continue Metformin 500 mg BID - HgB A1c - POCT urinalysis dip (device) - Basic Metabolic Panel - Microalbumin/Creatinine Ratio, Urine  2. Tobacco  dependence Smoking cessation instruction/counseling given:  counseled patient on the dangers of tobacco use, advised patient to stop smoking, and reviewed strategies to maximize success  3. Essential hypertension Blood pressure is at goal on current medication regimen - POCT urinalysis dip (device) - hydrochlorothiazide (HYDRODIURIL) 12.5 MG tablet; Take 1 tablet (12.5 mg total) by mouth daily.  Dispense: 30 tablet; Refill: 5 - Basic Metabolic Panel - Microalbumin/Creatinine Ratio, Urine  4. Vertigo - meclizine (ANTIVERT) 25 MG tablet; Take 1 tablet (25 mg total) by mouth 3 (three) times daily as needed for dizziness.  Dispense: 60 tablet; Refill: 1  5. Hypertriglyceridemia The 10-year ASCVD risk score Denman George(Goff [place] Montez HagemanJr., et al., 2013) is: 22.2%   Values used to calculate the score:     Age: 89 years     Sex: Male     Is Non-Hispanic African American: No     Diabetic: Yes     Tobacco smoker: Yes     Systolic Blood Pressure: 111 mmHg     Is BP treated: Yes     HDL Cholesterol: 25 mg/dL     Total Cholesterol: 167 mg/dL  - fenofibrate (TRICOR) 145 MG tablet; Take 1 tablet (145 mg total) by mouth daily.  Dispense: 30 tablet; Refill: 11 - Lipid Panel  6. Need for Tdap vaccination - Tdap vaccine greater than or equal to 7yo IM  7. Immunization due - Pneumococcal polysaccharide vaccine 23-valent greater than or equal to 2yo subcutaneous/IM   RTC: 6 months for chronic conditions   Nolon NationsLachina Moore Kanika Bungert  MSN, FNP-C [facility] [address]  [place], [place] [address] [phone]

## 2017-09-02 NOTE — Patient Instructions (Signed)
Your hemoglobin a1c has decreased to 6.4. You are doing an amazing job modifying your diet. Keep up the good work.  Your A1C goal is less than 7. Your fasting blood sugar  Upon awakening goal is between 110-140.  Blood pressure goal is <140/90.  Recommend a lowfat, low carbohydrate diet divided over 5-6 small meals, increase water intake to 6-8 glasses, and 150 minutes per week of cardiovascular exercise.   Take your medications as prescribed Make sure that you are familiar with each one of your medications and what they are used to treat.  If you are unsure of medications, please bring to follow up Will send referral for eye exam  Please keep your scheduled follow up appointment.

## 2017-09-03 LAB — LIPID PANEL
CHOL/HDL RATIO: 6.2 ratio — AB (ref 0.0–5.0)
Cholesterol, Total: 192 mg/dL (ref 100–199)
HDL: 31 mg/dL — ABNORMAL LOW (ref >39)
LDL CALC: 126 mg/dL — AB (ref 0–99)
Triglycerides: 174 mg/dL — ABNORMAL HIGH (ref 0–149)
VLDL Cholesterol Cal: 35 mg/dL (ref 5–40)

## 2017-09-03 LAB — BASIC METABOLIC PANEL
BUN/Creatinine Ratio: 21 — ABNORMAL HIGH (ref 9–20)
BUN: 17 mg/dL (ref 6–24)
CALCIUM: 9.6 mg/dL (ref 8.7–10.2)
CO2: 24 mmol/L (ref 20–29)
Chloride: 99 mmol/L (ref 96–106)
Creatinine, Ser: 0.82 mg/dL (ref 0.76–1.27)
GFR calc Af Amer: 118 mL/min/{1.73_m2} (ref >59)
GFR calc non Af Amer: 102 mL/min/{1.73_m2} (ref >59)
Glucose: 111 mg/dL — ABNORMAL HIGH (ref 65–99)
POTASSIUM: 4.3 mmol/L (ref 3.5–5.2)
SODIUM: 139 mmol/L (ref 134–144)

## 2017-09-03 LAB — MICROALBUMIN / CREATININE URINE RATIO
Creatinine, Urine: 135.3 mg/dL
MICROALB/CREAT RATIO: 4.7 mg/g{creat} (ref 0.0–30.0)
MICROALBUM., U, RANDOM: 6.4 ug/mL

## 2017-09-04 ENCOUNTER — Telehealth: Payer: Self-pay

## 2017-09-04 NOTE — Telephone Encounter (Signed)
-----   Message from Massie MaroonLachina M Hollis, [place] sent at 09/03/2017  5:22 PM EST ----- Regarding: lab results Please inform patient that cholesterol has improved on medication regimen, will continue at current dosage. Recommend a lowfat, low carbohydrate diet divided over 5-6 small meals, increase water intake to 6-8 glasses, and 150 minutes per week of cardiovascular exercise.    Thanks

## 2017-09-04 NOTE — Telephone Encounter (Signed)
Called and spoke with patient, advised that cholesterol has improved on medication and to continue current dosage. Asked that patient eat a low fat/ low carb diet over 5 to 6 small meals daily, drink 6 to 8 glasses of water daily and exercise 150 minutes weekly of cardio. Patient verbalized understanding and was asked to keep next scheduled appointment. Thanks!

## 2017-09-23 MED FILL — FENOFIBRATE 145 MG TABLET: 145 | 30 days supply | Qty: 30 | Fill #4

## 2017-09-23 MED FILL — HYDROCHLOROTHIAZIDE 12.5 MG: 12.5 | 30 days supply | Qty: 30 | Fill #5

## 2017-09-23 MED FILL — MECLIZINE 25 MG TABLET: 25 | 20 days supply | Qty: 60 | Fill #0

## 2017-09-23 MED FILL — OMEPRAZOLE DR 20 MG CAPSULE: 20 | 30 days supply | Qty: 30 | Fill #5

## 2017-09-23 MED FILL — metFORMIN HCL 500 MG TABS: 500 | 30 days supply | Qty: 60 | Fill #5

## 2017-09-25 ENCOUNTER — Telehealth: Payer: Self-pay

## 2017-09-25 NOTE — Telephone Encounter (Signed)
Patient states he got his last refill on medication today. I advised him to call back when it was almost due to be refilled again and we will send to pharmacy. Thanks!

## 2017-10-23 ENCOUNTER — Other Ambulatory Visit: Payer: Self-pay | Admitting: Family Medicine

## 2017-10-23 DIAGNOSIS — K219 Gastro-esophageal reflux disease without esophagitis: Secondary | ICD-10-CM

## 2017-10-23 DIAGNOSIS — E1149 Type 2 diabetes mellitus with other diabetic neurological complication: Secondary | ICD-10-CM

## 2017-10-23 MED FILL — HYDROCHLOROTHIAZIDE 12.5 MG: 12.5 | 30 days supply | Qty: 30 | Fill #0

## 2017-10-23 MED FILL — FENOFIBRATE 145 MG TABLET: 145 | 30 days supply | Qty: 30 | Fill #5

## 2017-10-23 MED FILL — MECLIZINE 25 MG TABLET: 25 | 20 days supply | Qty: 60 | Fill #1

## 2017-10-26 MED FILL — metFORMIN HCL 500 MG TABS: 500 | 30 days supply | Qty: 60 | Fill #0

## 2017-10-26 MED FILL — OMEPRAZOLE DR 20 MG CAPSULE: 20 | 30 days supply | Qty: 30 | Fill #0

## 2017-11-26 ENCOUNTER — Telehealth (HOSPITAL_COMMUNITY): Payer: Self-pay | Admitting: General Practice

## 2017-11-26 MED FILL — FENOFIBRATE 145 MG TABLET: 145 | 30 days supply | Qty: 30 | Fill #0

## 2017-11-26 MED FILL — HYDROCHLOROTHIAZIDE 12.5 MG: 12.5 | 30 days supply | Qty: 30 | Fill #1

## 2017-11-26 MED FILL — metFORMIN HCL 500 MG TABS: 500 | 30 days supply | Qty: 60 | Fill #1

## 2017-11-26 MED FILL — OMEPRAZOLE 20 MG CAP: 20 | 30 days supply | Qty: 30 | Fill #1

## 2017-11-26 NOTE — Telephone Encounter (Signed)
Patient's "wife" called with medical questions about patient's dental procedure coming up soon. I notified Erin Sons, NP.

## 2017-12-31 MED FILL — OMEPRAZOLE 20 MG CAP: 20 | 30 days supply | Qty: 30 | Fill #2

## 2017-12-31 MED FILL — metFORMIN HCL 500 MG TABS: 500 | 30 days supply | Qty: 60 | Fill #2

## 2017-12-31 MED FILL — FENOFIBRATE 145 MG TABLET: 145 | 30 days supply | Qty: 30 | Fill #1

## 2017-12-31 MED FILL — HYDROCHLOROTHIAZIDE 12.5 MG: 12.5 | 30 days supply | Qty: 30 | Fill #2

## 2018-02-01 MED FILL — metFORMIN HCL 500 MG TABS: 500 | 30 days supply | Qty: 60 | Fill #3

## 2018-02-01 MED FILL — HYDROCHLOROTHIAZIDE 12.5 MG: 12.5 | 30 days supply | Qty: 30 | Fill #3

## 2018-02-01 MED FILL — FENOFIBRATE 145 MG TABLET: 145 | 30 days supply | Qty: 30 | Fill #2

## 2018-02-01 MED FILL — OMEPRAZOLE 20 MG CAP: 20 | 30 days supply | Qty: 30 | Fill #3

## 2018-03-03 ENCOUNTER — Ambulatory Visit (INDEPENDENT_AMBULATORY_CARE_PROVIDER_SITE_OTHER): Payer: Self-pay | Admitting: Family Medicine

## 2018-03-03 ENCOUNTER — Encounter: Payer: Self-pay | Admitting: Family Medicine

## 2018-03-03 VITALS — BP 119/66 | HR 64 | Temp 98.8°F | Resp 16 | Ht 69.0 in | Wt 205.0 lb

## 2018-03-03 DIAGNOSIS — K219 Gastro-esophageal reflux disease without esophagitis: Secondary | ICD-10-CM

## 2018-03-03 DIAGNOSIS — Z114 Encounter for screening for human immunodeficiency virus [HIV]: Secondary | ICD-10-CM

## 2018-03-03 DIAGNOSIS — E781 Pure hyperglyceridemia: Secondary | ICD-10-CM

## 2018-03-03 DIAGNOSIS — J449 Chronic obstructive pulmonary disease, unspecified: Secondary | ICD-10-CM

## 2018-03-03 DIAGNOSIS — I10 Essential (primary) hypertension: Secondary | ICD-10-CM

## 2018-03-03 DIAGNOSIS — E1149 Type 2 diabetes mellitus with other diabetic neurological complication: Secondary | ICD-10-CM

## 2018-03-03 LAB — POCT URINALYSIS DIPSTICK
Bilirubin, UA: NEGATIVE
Blood, UA: NEGATIVE
Glucose, UA: NEGATIVE
Ketones, UA: NEGATIVE
Leukocytes, UA: NEGATIVE
Nitrite, UA: NEGATIVE
Protein, UA: NEGATIVE
Spec Grav, UA: 1.02 (ref 1.010–1.025)
Urobilinogen, UA: 2 E.U./dL — AB
pH, UA: 7 (ref 5.0–8.0)

## 2018-03-03 LAB — POCT GLYCOSYLATED HEMOGLOBIN (HGB A1C): Hemoglobin A1C: 5.6 % (ref 4.0–5.6)

## 2018-03-03 MED ORDER — METFORMIN HCL 500 MG PO TABS: ORAL_TABLET | ORAL | 5 refills | Status: DC

## 2018-03-03 MED ORDER — LISINOPRIL 2.5 MG PO TABS: 2.5000 mg | ORAL_TABLET | Freq: Every day | ORAL | 11 refills | Status: DC

## 2018-03-03 MED ORDER — OMEPRAZOLE 20 MG PO CPDR: 20.0000 mg | DELAYED_RELEASE_CAPSULE | Freq: Every day | ORAL | 5 refills | Status: DC

## 2018-03-03 MED ORDER — FENOFIBRATE 145 MG PO TABS: 145.0000 mg | ORAL_TABLET | Freq: Every day | ORAL | 11 refills | Status: DC

## 2018-03-03 MED ORDER — ALBUTEROL SULFATE HFA 108 (90 BASE) MCG/ACT IN AERS: 2.0000 | INHALATION_SPRAY | Freq: Four times a day (QID) | RESPIRATORY_TRACT | 11 refills | Status: DC | PRN

## 2018-03-03 MED ORDER — HYDROCHLOROTHIAZIDE 12.5 MG PO TABS: 12.5000 mg | ORAL_TABLET | Freq: Every day | ORAL | 5 refills | Status: DC

## 2018-03-03 MED FILL — FENOFIBRATE 145 MG TAB: 145 | 30 days supply | Qty: 30 | Fill #0

## 2018-03-03 MED FILL — HYDROCHLOROTHIAZIDE 12.5 MG: 12.5 | 30 days supply | Qty: 30 | Fill #0

## 2018-03-03 MED FILL — metFORMIN HCL 500 MG TABS: 500 | 30 days supply | Qty: 60 | Fill #0

## 2018-03-03 MED FILL — LISINOPRIL 2.5 MG TABLET: 2.5 | 30 days supply | Qty: 30 | Fill #0

## 2018-03-03 MED FILL — OMEPRAZOLE 20 MG CAP: 20 | 30 days supply | Qty: 30 | Fill #0

## 2018-03-03 MED FILL — !VENTOLIN HFA INHALER: 108 (90 BAS | 25 days supply | Qty: 18 | Fill #0

## 2018-03-03 NOTE — Progress Notes (Signed)
[facility]  Provider: Mike GipAndre Jeiry Birnbaum, FNP Diabetes Mellitus Type II, Follow-up:   Patient here for follow-up of Type 2 diabetes mellitus.  Known diabetic complications: cardiovascular disease Cardiovascular risk factors: advanced age (older than 3555 for men, 4565 for women), diabetes mellitus, dyslipidemia, male gender, obesity (BMI >= 30 kg/m2) and smoking/ tobacco exposure Current diabetic medications include : metformin.   Eye exam current (within one year): no Weight trend: stable Prior visit with dietician: no Current diet: in general, a "healthy" diet   Current exercise: walking  Current monitoring regimen: none Home blood sugar records: trend: does not check Any episodes of hypoglycemia? no  Is He on ACE inhibitor or angiotensin II receptor blocker?  No  Low fat/carbohydrate diet?  Yes Nicotine Abuse?  Yes Medication Compliance?  Yes Exercise?  Yes Alcohol Abuse?  No  Home BG Monitoring:  Checking 0 times a day. Average:  0  High: 0  Low:  0  Foot Exam:  1. Do you have any pain in your calf muscles when walking?  No 2. Do you have any pain in your foot or feet?  No  If yes, n/a 3. Have you had any problems with your feet since the last visit?  No.  If yes, n/a 4. Do you have any sores on your feet?  No.  If yes, which foot?  Neither 5. Have you noticed any blood or discharge on your socks or hose?  No 6. Does patient wear shoes that are closed toe, low heel, adequate toe room (no pointed toe), and breathable material like leather, canvas or suede?  Yes  PAST DIABETIC HISTORY:  1. History of amputations:  No If yes, location:  n/a 2. Previous foot ulcer:  No If yes, location: n/a Review of Systems  Constitutional: Negative.   HENT: Negative.   Eyes: Negative.   Respiratory: Negative.   Cardiovascular: Negative.   Gastrointestinal: Negative.   Genitourinary: Negative.   Musculoskeletal: Negative.   Skin: Negative.    Neurological: Negative.   Psychiatric/Behavioral: Negative.      Lab Results  Component Value Date   HGBA1C 6.4 09/02/2017    No results found for: Concepcion ElkMICROALBUR, [id number]  Lab Results  Component Value Date   CHOL 192 09/02/2017   HDL 31 (L) 09/02/2017   LDLCALC 126 (H) 09/02/2017   TRIG 174 (H) 09/02/2017   CHOLHDL 6.2 (H) 09/02/2017     Physical Exam  Constitutional: He is oriented to person, place, and time and well-developed, well-nourished, and in no distress. No distress.  HENT:  Head: Normocephalic and atraumatic.  Eyes: Pupils are equal, round, and reactive to light. Conjunctivae and EOM are normal.  Neck: Normal range of motion. Neck supple.  Cardiovascular: Normal rate, regular rhythm and intact distal pulses. Exam reveals no gallop and no friction rub.  No murmur heard. Pulmonary/Chest: Effort normal and breath sounds normal. No respiratory distress. He has no wheezes.  Abdominal: Soft. Bowel sounds are normal. There is no tenderness.  Musculoskeletal: Normal range of motion. He exhibits no edema or tenderness.  Lymphadenopathy:    He has no cervical adenopathy.  Neurological: He is alert and oriented to person, place, and time. Gait normal.  Skin: Skin is warm and dry.  Psychiatric: Mood, memory, affect and judgment normal.  Nursing note and vitals reviewed.    1. Essential hypertension The current medical regimen is effective;  continue present plan and medications.  - Urinalysis Dipstick -  hydrochlorothiazide (HYDRODIURIL) 12.5 MG tablet; Take 1 tablet (12.5 mg total) by mouth daily.  Dispense: 30 tablet; Refill: 5 - CBC With Differential - Comprehensive metabolic panel  2. Type 2 diabetes mellitus with other neurologic complication, without long-term current use of insulin (HCC) The current medical regimen is effective;  continue present plan and medications. At goal with A1C at 5.6 - HgB A1c - metFORMIN (GLUCOPHAGE) 500 MG tablet; TAKE 1 TABLET BY  MOUTH 2 TIMES DAILY WITH A MEAL.  Dispense: 60 tablet; Refill: 5  3. Gastroesophageal reflux disease without esophagitis The current medical regimen is effective;  continue present plan and medications.  - omeprazole (PRILOSEC) 20 MG capsule; Take 1 capsule (20 mg total) by mouth daily.  Dispense: 30 capsule; Refill: 5  4. Hypertriglyceridemia Continue with current medications  - fenofibrate (TRICOR) 145 MG tablet; Take 1 tablet (145 mg total) by mouth daily.  Dispense: 30 tablet; Refill: 11  5. Chronic obstructive pulmonary disease, unspecified COPD type (HCC) Continue with current medications  - albuterol (PROVENTIL HFA;VENTOLIN HFA) 108 (90 Base) MCG/ACT inhaler; Inhale 2 puffs into the lungs every 6 (six) hours as needed for wheezing or shortness of breath.  Dispense: 1 each; Refill: 11  6. Screening for HIV (human immunodeficiency virus) - HIV antibody (with reflex)    Recommendations: 1.  Patient is counseled on appropriate foot care. 2.  BP goal < 130/80. 3.  LDL goal of < 100, HDL > 40 and TG < 150. 4.  Eye Exam yearly and Dental Exam every 6 months. 5.  Dietary recommendations:  Carb modified diet 6.  Physical Activity recommendations:  150 minutes per week 7.  Medication recommendations at this time are as follows:  Continue with above orders.  8.  Return to clinic in 6 months

## 2018-03-03 NOTE — Patient Instructions (Addendum)
I started you on a medication call lisinopril to protect your kidneys. Take this every day. The major side effect is a dry cough. If it develops, stop the medication and it will resolve. I will see you in 6 months. Please remember to come to the next appointment fasting (nothing to eat or drink after midnight except water).     Mediterranean Diet A Mediterranean diet refers to food and lifestyle choices that are based on the traditions of countries located on the [place]. This way of eating has been shown to help prevent certain conditions and improve outcomes for people who have chronic diseases, like kidney disease and heart disease. What are tips for following this plan? Lifestyle  Cook and eat meals together with your family, when possible.  Drink enough fluid to keep your urine clear or pale yellow.  Be physically active every day. This includes: ? Aerobic exercise like running or swimming. ? Leisure activities like gardening, walking, or housework.  Get 7-8 hours of sleep each night.  If recommended by your health care provider, drink red wine in moderation. This means 1 glass a day for nonpregnant women and 2 glasses a day for men. A glass of wine equals 5 oz (150 mL). Reading food labels  Check the serving size of packaged foods. For foods such as rice and pasta, the serving size refers to the amount of cooked product, not dry.  Check the total fat in packaged foods. Avoid foods that have saturated fat or trans fats.  Check the ingredients list for added sugars, such as corn syrup. Shopping  At the grocery store, buy most of your food from the areas near the walls of the store. This includes: ? Fresh fruits and vegetables (produce). ? Grains, beans, nuts, and seeds. Some of these may be available in unpackaged forms or large amounts (in bulk). ? Fresh seafood. ? Poultry and eggs. ? Low-fat dairy products.  Buy whole ingredients instead of prepackaged  foods.  Buy fresh fruits and vegetables in-season from local farmers markets.  Buy frozen fruits and vegetables in resealable bags.  If you do not have access to quality fresh seafood, buy precooked frozen shrimp or canned fish, such as tuna, salmon, or sardines.  Buy small amounts of raw or cooked vegetables, salads, or olives from the deli or salad bar at your store.  Stock your pantry so you always have certain foods on hand, such as olive oil, canned tuna, canned tomatoes, rice, pasta, and beans. Cooking  Cook foods with extra-virgin olive oil instead of using butter or other vegetable oils.  Have meat as a side dish, and have vegetables or grains as your main dish. This means having meat in small portions or adding small amounts of meat to foods like pasta or stew.  Use beans or vegetables instead of meat in common dishes like chili or lasagna.  Experiment with different cooking methods. Try roasting or broiling vegetables instead of steaming or sauteing them.  Add frozen vegetables to soups, stews, pasta, or rice.  Add nuts or seeds for added healthy fat at each meal. You can add these to yogurt, salads, or vegetable dishes.  Marinate fish or vegetables using olive oil, lemon juice, garlic, and fresh herbs. Meal planning  Plan to eat 1 vegetarian meal one day each week. Try to work up to 2 vegetarian meals, if possible.  Eat seafood 2 or more times a week.  Have healthy snacks readily available, such as: ?  Vegetable sticks with hummus. ? [place] yogurt. ? Fruit and nut trail mix.  Eat balanced meals throughout the week. This includes: ? Fruit: 2-3 servings a day ? Vegetables: 4-5 servings a day ? Low-fat dairy: 2 servings a day ? Fish, poultry, or lean meat: 1 serving a day ? Beans and legumes: 2 or more servings a week ? Nuts and seeds: 1-2 servings a day ? Whole grains: 6-8 servings a day ? Extra-virgin olive oil: 3-4 servings a day  Limit red meat and sweets to  only a few servings a month What are my food choices?  Mediterranean diet ? Recommended ? Grains: Whole-grain pasta. Brown rice. Bulgar wheat. Polenta. Couscous. Whole-wheat bread. Orpah Cobb. ? Vegetables: Artichokes. Beets. Broccoli. Cabbage. Carrots. Eggplant. Green beans. Chard. Kale. Spinach. Onions. Leeks. Peas. Squash. Tomatoes. Peppers. Radishes. ? Fruits: Apples. Apricots. Avocado. Berries. Bananas. Cherries. Dates. Figs. Grapes. Lemons. Melon. Oranges. Peaches. Plums. Pomegranate. ? Meats and other protein foods: Beans. Almonds. Sunflower seeds. Pine nuts. Peanuts. Cod. Salmon. Scallops. Shrimp. Tuna. Tilapia. Clams. Oysters. Eggs. ? Dairy: Low-fat milk. Cheese. Greek yogurt. ? Beverages: Water. Red wine. Herbal tea. ? Fats and oils: Extra virgin olive oil. Avocado oil. Grape seed oil. ? Sweets and desserts: [place] yogurt with honey. Baked apples. Poached pears. Trail mix. ? Seasoning and other foods: Basil. Cilantro. Coriander. Cumin. Mint. Parsley. Sage. Rosemary. Tarragon. Garlic. Oregano. Thyme. Pepper. Balsalmic vinegar. Tahini. Hummus. Tomato sauce. Olives. Mushrooms. ? Limit these ? Grains: Prepackaged pasta or rice dishes. Prepackaged cereal with added sugar. ? Vegetables: Deep fried potatoes (french fries). ? Fruits: Fruit canned in syrup. ? Meats and other protein foods: Beef. Pork. Lamb. Poultry with skin. Hot dogs. Tomasa Blase. ? Dairy: Ice cream. Sour cream. Whole milk. ? Beverages: Juice. Sugar-sweetened soft drinks. Beer. Liquor and spirits. ? Fats and oils: Butter. Canola oil. Vegetable oil. Beef fat (tallow). Lard. ? Sweets and desserts: Cookies. Cakes. Pies. Candy. ? Seasoning and other foods: Mayonnaise. Premade sauces and marinades. ? The items listed may not be a complete list. Talk with your dietitian about what dietary choices are right for you. Summary  The Mediterranean diet includes both food and lifestyle choices.  Eat a variety of fresh fruits and  vegetables, beans, nuts, seeds, and whole grains.  Limit the amount of red meat and sweets that you eat.  Talk with your health care provider about whether it is safe for you to drink red wine in moderation. This means 1 glass a day for nonpregnant women and 2 glasses a day for men. A glass of wine equals 5 oz (150 mL). This information is not intended to replace advice given to you by your health care provider. Make sure you discuss any questions you have with your health care provider. Document Released: 02/28/2016 Document Revised: 04/01/2016 Document Reviewed: 02/28/2016 Elsevier Interactive Patient Education  2018 [employer]. Diabetes and Foot Care Diabetes may cause you to have problems because of poor blood supply (circulation) to your feet and legs. This may cause the skin on your feet to become thinner, break easier, and heal more slowly. Your skin may become dry, and the skin may peel and crack. You may also have nerve damage in your legs and feet causing decreased feeling in them. You may not notice minor injuries to your feet that could lead to infections or more serious problems. Taking care of your feet is one of the most important things you can do for yourself. Follow these instructions at home:  Wear shoes at all times, even in the house. Do not go barefoot. Bare feet are easily injured.  Check your feet daily for blisters, cuts, and redness. If you cannot see the bottom of your feet, use a mirror or ask someone for help.  Wash your feet with warm water (do not use hot water) and mild soap. Then pat your feet and the areas between your toes until they are completely dry. Do not soak your feet as this can dry your skin.  Apply a moisturizing lotion or petroleum jelly (that does not contain alcohol and is unscented) to the skin on your feet and to dry, brittle toenails. Do not apply lotion between your toes.  Trim your toenails straight across. Do not dig under them or around  the cuticle. File the edges of your nails with an emery board or nail file.  Do not cut corns or calluses or try to remove them with medicine.  Wear clean socks or stockings every day. Make sure they are not too tight. Do not wear knee-high stockings since they may decrease blood flow to your legs.  Wear shoes that fit properly and have enough cushioning. To break in new shoes, wear them for just a few hours a day. This prevents you from injuring your feet. Always look in your shoes before you put them on to be sure there are no objects inside.  Do not cross your legs. This may decrease the blood flow to your feet.  If you find a minor scrape, cut, or break in the skin on your feet, keep it and the skin around it clean and dry. These areas may be cleansed with mild soap and water. Do not cleanse the area with peroxide, alcohol, or iodine.  When you remove an adhesive bandage, be sure not to damage the skin around it.  If you have a wound, look at it several times a day to make sure it is healing.  Do not use heating pads or hot water bottles. They may burn your skin. If you have lost feeling in your feet or legs, you may not know it is happening until it is too late.  Make sure your health care provider performs a complete foot exam at least annually or more often if you have foot problems. Report any cuts, sores, or bruises to your health care provider immediately. Contact a health care provider if:  You have an injury that is not healing.  You have cuts or breaks in the skin.  You have an ingrown nail.  You notice redness on your legs or feet.  You feel burning or tingling in your legs or feet.  You have pain or cramps in your legs and feet.  Your legs or feet are numb.  Your feet always feel cold. Get help right away if:  There is increasing redness, swelling, or pain in or around a wound.  There is a red line that goes up your leg.  Pus is coming from a wound.  You  develop a fever or as directed by your health care provider.  You notice a bad smell coming from an ulcer or wound. This information is not intended to replace advice given to you by your health care provider. Make sure you discuss any questions you have with your health care provider. Document Released: 07/04/2000 Document Revised: 12/13/2015 Document Reviewed: 12/14/2012 Elsevier Interactive Patient Education  2017 [employer]

## 2018-03-04 LAB — COMPREHENSIVE METABOLIC PANEL
ALT: 17 IU/L (ref 0–44)
AST: 12 IU/L (ref 0–40)
Albumin/Globulin Ratio: 1.9 (ref 1.2–2.2)
Albumin: 4.6 g/dL (ref 3.5–5.5)
Alkaline Phosphatase: 48 IU/L (ref 39–117)
BUN/Creatinine Ratio: 21 — ABNORMAL HIGH (ref 9–20)
BUN: 17 mg/dL (ref 6–24)
Bilirubin Total: 0.7 mg/dL (ref 0.0–1.2)
CO2: 26 mmol/L (ref 20–29)
Calcium: 9.4 mg/dL (ref 8.7–10.2)
Chloride: 96 mmol/L (ref 96–106)
Creatinine, Ser: 0.82 mg/dL (ref 0.76–1.27)
GFR calc Af Amer: 117 mL/min/{1.73_m2} (ref >59)
GFR calc non Af Amer: 101 mL/min/{1.73_m2} (ref >59)
Globulin, Total: 2.4 g/dL (ref 1.5–4.5)
Glucose: 112 mg/dL — ABNORMAL HIGH (ref 65–99)
Potassium: 3.8 mmol/L (ref 3.5–5.2)
Sodium: 136 mmol/L (ref 134–144)
Total Protein: 7 g/dL (ref 6.0–8.5)

## 2018-03-04 LAB — CBC WITH DIFFERENTIAL
Basophils Absolute: 0 10*3/uL (ref 0.0–0.2)
Basos: 1 %
EOS (ABSOLUTE): 0.3 10*3/uL (ref 0.0–0.4)
Eos: 3 %
Hematocrit: 47.7 % (ref 37.5–51.0)
Hemoglobin: 16.4 g/dL (ref 13.0–17.7)
Immature Grans (Abs): 0 10*3/uL (ref 0.0–0.1)
Immature Granulocytes: 0 %
Lymphocytes Absolute: 3.3 10*3/uL — ABNORMAL HIGH (ref 0.7–3.1)
Lymphs: 38 %
MCH: 31.1 pg (ref 26.6–33.0)
MCHC: 34.4 g/dL (ref 31.5–35.7)
MCV: 90 fL (ref 79–97)
Monocytes Absolute: 0.6 10*3/uL (ref 0.1–0.9)
Monocytes: 7 %
Neutrophils Absolute: 4.5 10*3/uL (ref 1.4–7.0)
Neutrophils: 51 %
RBC: 5.28 x10E6/uL (ref 4.14–5.80)
RDW: 13.1 % (ref 12.3–15.4)
WBC: 8.7 10*3/uL (ref 3.4–10.8)

## 2018-03-04 LAB — HIV ANTIBODY (ROUTINE TESTING W REFLEX): HIV Screen 4th Generation wRfx: NONREACTIVE

## 2018-04-05 MED FILL — ALBUTEROL SULFATE HFA 108 (: 108 (90 BAS | 25 days supply | Qty: 18 | Fill #1

## 2018-04-05 MED FILL — metFORMIN HCL 500 MG TABS: 500 | 30 days supply | Qty: 60 | Fill #1

## 2018-04-05 MED FILL — FENOFIBRATE 145 MG TABLET: 145 | 30 days supply | Qty: 30 | Fill #1

## 2018-04-05 MED FILL — HYDROCHLOROTHIAZIDE 12.5 MG: 12.5 | 30 days supply | Qty: 30 | Fill #1

## 2018-04-05 MED FILL — OMEPRAZOLE 20 MG CAP: 20 | 30 days supply | Qty: 30 | Fill #1

## 2018-04-05 MED FILL — LISINOPRIL 2.5 MG TABLET: 2.5 | 30 days supply | Qty: 30 | Fill #1

## 2018-05-05 ENCOUNTER — Other Ambulatory Visit: Payer: Self-pay | Admitting: Family Medicine

## 2018-05-05 DIAGNOSIS — R42 Dizziness and giddiness: Secondary | ICD-10-CM

## 2018-05-05 MED FILL — ALBUTEROL SULFATE HFA 108 (: 108 (90 BAS | 25 days supply | Qty: 18 | Fill #2

## 2018-05-05 MED FILL — OMEPRAZOLE 20 MG CAP: 20 | 30 days supply | Qty: 30 | Fill #2

## 2018-05-05 MED FILL — LISINOPRIL 2.5 MG TABLET: 2.5 | 30 days supply | Qty: 30 | Fill #2

## 2018-05-05 MED FILL — metFORMIN HCL 500 MG TABS: 500 | 30 days supply | Qty: 60 | Fill #2

## 2018-05-05 MED FILL — HYDROCHLOROTHIAZIDE 12.5 MG: 12.5 | 30 days supply | Qty: 30 | Fill #2

## 2018-05-06 ENCOUNTER — Telehealth: Payer: Self-pay

## 2018-05-06 MED FILL — MECLIZINE 25 MG TABLET: 25 | 20 days supply | Qty: 60 | Fill #0

## 2018-05-06 NOTE — Telephone Encounter (Signed)
Medication already sent to pharmacy today.

## 2018-05-07 MED FILL — FENOFIBRATE 145 MG TABLET: 145 | 30 days supply | Qty: 30 | Fill #2

## 2018-06-07 MED FILL — FENOFIBRATE 145 MG TABLET: 145 | 30 days supply | Qty: 30 | Fill #3

## 2018-06-07 MED FILL — HYDROCHLOROTHIAZIDE 12.5 MG: 12.5 | 30 days supply | Qty: 30 | Fill #3

## 2018-06-07 MED FILL — OMEPRAZOLE 20 MG CAP: 20 | 30 days supply | Qty: 30 | Fill #3

## 2018-06-07 MED FILL — LISINOPRIL 2.5 MG TABLET: 2.5 | 30 days supply | Qty: 30 | Fill #3

## 2018-06-07 MED FILL — metFORMIN HCL 500 MG TABS: 500 | 30 days supply | Qty: 60 | Fill #3

## 2018-07-07 MED FILL — HYDROCHLOROTHIAZIDE 12.5 MG: 12.5 | 30 days supply | Qty: 30 | Fill #4

## 2018-07-07 MED FILL — OMEPRAZOLE 20 MG CAP: 20 | 30 days supply | Qty: 30 | Fill #4

## 2018-07-07 MED FILL — metFORMIN HCL 500 MG TABS: 500 | 30 days supply | Qty: 60 | Fill #4

## 2018-07-07 MED FILL — LISINOPRIL 2.5 MG TABLET: 2.5 | 30 days supply | Qty: 30 | Fill #4

## 2018-07-07 MED FILL — FENOFIBRATE 145 MG TABLET: 145 | 30 days supply | Qty: 30 | Fill #4

## 2018-08-09 MED FILL — LISINOPRIL 2.5 MG TABLET: 2.5 | 30 days supply | Qty: 30 | Fill #5

## 2018-08-09 MED FILL — metFORMIN HCL 500 MG TABS: 500 | 30 days supply | Qty: 60 | Fill #5

## 2018-08-09 MED FILL — HYDROCHLOROTHIAZIDE 12.5 MG: 12.5 | 30 days supply | Qty: 30 | Fill #5

## 2018-08-09 MED FILL — FENOFIBRATE 145 MG TABLET: 145 | 30 days supply | Qty: 30 | Fill #5

## 2018-08-09 MED FILL — OMEPRAZOLE 20 MG CAP: 20 | 30 days supply | Qty: 30 | Fill #5

## 2018-09-03 ENCOUNTER — Encounter: Payer: Self-pay | Admitting: Family Medicine

## 2018-09-03 ENCOUNTER — Ambulatory Visit (INDEPENDENT_AMBULATORY_CARE_PROVIDER_SITE_OTHER): Payer: Self-pay | Admitting: Family Medicine

## 2018-09-03 VITALS — BP 115/71 | HR 70 | Temp 97.9°F | Resp 14 | Ht 69.0 in | Wt 221.0 lb

## 2018-09-03 DIAGNOSIS — K219 Gastro-esophageal reflux disease without esophagitis: Secondary | ICD-10-CM

## 2018-09-03 DIAGNOSIS — E781 Pure hyperglyceridemia: Secondary | ICD-10-CM

## 2018-09-03 DIAGNOSIS — F17200 Nicotine dependence, unspecified, uncomplicated: Secondary | ICD-10-CM

## 2018-09-03 DIAGNOSIS — J449 Chronic obstructive pulmonary disease, unspecified: Secondary | ICD-10-CM

## 2018-09-03 DIAGNOSIS — I10 Essential (primary) hypertension: Secondary | ICD-10-CM

## 2018-09-03 DIAGNOSIS — E1149 Type 2 diabetes mellitus with other diabetic neurological complication: Secondary | ICD-10-CM

## 2018-09-03 DIAGNOSIS — R42 Dizziness and giddiness: Secondary | ICD-10-CM

## 2018-09-03 LAB — POCT URINALYSIS DIPSTICK
Bilirubin, UA: NEGATIVE
Blood, UA: NEGATIVE
Glucose, UA: NEGATIVE
Ketones, UA: NEGATIVE
Leukocytes, UA: NEGATIVE
Nitrite, UA: NEGATIVE
Protein, UA: NEGATIVE
Spec Grav, UA: 1.02 (ref 1.010–1.025)
Urobilinogen, UA: 0.2 E.U./dL
pH, UA: 7 (ref 5.0–8.0)

## 2018-09-03 LAB — POCT GLYCOSYLATED HEMOGLOBIN (HGB A1C): Hemoglobin A1C: 6.3 % — AB (ref 4.0–5.6)

## 2018-09-03 MED ORDER — OMEPRAZOLE 20 MG PO CPDR: 20.0000 mg | DELAYED_RELEASE_CAPSULE | Freq: Every day | ORAL | 5 refills | Status: DC

## 2018-09-03 MED ORDER — ALBUTEROL SULFATE HFA 108 (90 BASE) MCG/ACT IN AERS: 2.0000 | INHALATION_SPRAY | Freq: Four times a day (QID) | RESPIRATORY_TRACT | 11 refills | Status: DC | PRN

## 2018-09-03 MED ORDER — MECLIZINE HCL 25 MG PO TABS: ORAL_TABLET | ORAL | 1 refills | Status: DC

## 2018-09-03 MED ORDER — FENOFIBRATE 145 MG PO TABS: 145.0000 mg | ORAL_TABLET | Freq: Every day | ORAL | 11 refills | Status: DC

## 2018-09-03 MED ORDER — HYDROCHLOROTHIAZIDE 12.5 MG PO TABS: 12.5000 mg | ORAL_TABLET | Freq: Every day | ORAL | 5 refills | Status: DC

## 2018-09-03 MED ORDER — LISINOPRIL 2.5 MG PO TABS: 2.5000 mg | ORAL_TABLET | Freq: Every day | ORAL | 11 refills | Status: DC

## 2018-09-03 MED ORDER — METFORMIN HCL 500 MG PO TABS: ORAL_TABLET | ORAL | 5 refills | Status: DC

## 2018-09-03 MED ORDER — ASPIRIN EC 81 MG PO TBEC: 81.0000 mg | DELAYED_RELEASE_TABLET | Freq: Every day | ORAL | 3 refills | Status: DC

## 2018-09-03 MED FILL — HYDROCHLOROTHIAZIDE 12.5 MG: 12.5 | 30 days supply | Qty: 30 | Fill #0

## 2018-09-03 MED FILL — metFORMIN HCL 500 MG TABS: 500 | 30 days supply | Qty: 60 | Fill #0

## 2018-09-03 MED FILL — LISINOPRIL 2.5 MG TABLET: 2.5 | 30 days supply | Qty: 30 | Fill #0

## 2018-09-03 MED FILL — FENOFIBRATE 145 MG TABLET: 145 | 30 days supply | Qty: 30 | Fill #0

## 2018-09-03 MED FILL — MECLIZINE 25 MG TABLET: 25 | 20 days supply | Qty: 60 | Fill #0

## 2018-09-03 MED FILL — OMEPRAZOLE 20 MG CAP: 20 | 30 days supply | Qty: 30 | Fill #0

## 2018-09-03 MED FILL — !VENTOLIN HFA INHALER: 108 (90 BAS | 25 days supply | Qty: 18 | Fill #0

## 2018-09-03 NOTE — Patient Instructions (Addendum)
Your A1C has increased in the past 6 months from 5.6 to 6.3. You are still in goal. Make sure you are watching your sodium intake, exercising and limiting processed foods.  We will repeat this at your next visit.    DASH Eating Plan DASH stands for "Dietary Approaches to Stop Hypertension." The DASH eating plan is a healthy eating plan that has been shown to reduce high blood pressure (hypertension). It may also reduce your risk for type 2 diabetes, heart disease, and stroke. The DASH eating plan may also help with weight loss. What are tips for following this plan?  General guidelines  Avoid eating more than 2,300 mg (milligrams) of salt (sodium) a day. If you have hypertension, you may need to reduce your sodium intake to 1,500 mg a day.  Limit alcohol intake to no more than 1 drink a day for nonpregnant women and 2 drinks a day for men. One drink equals 12 oz of beer, 5 oz of wine, or 1 oz of hard liquor.  Work with your health care provider to maintain a healthy body weight or to lose weight. Ask what an ideal weight is for you.  Get at least 30 minutes of exercise that causes your heart to beat faster (aerobic exercise) most days of the week. Activities may include walking, swimming, or biking.  Work with your health care provider or diet and nutrition specialist (dietitian) to adjust your eating plan to your individual calorie needs. Reading food labels   Check food labels for the amount of sodium per serving. Choose foods with less than 5 percent of the Daily Value of sodium. Generally, foods with less than 300 mg of sodium per serving fit into this eating plan.  To find whole grains, look for the word "whole" as the first word in the ingredient list. Shopping  Buy products labeled as "low-sodium" or "no salt added."  Buy fresh foods. Avoid canned foods and premade or frozen meals. Cooking  Avoid adding salt when cooking. Use salt-free seasonings or herbs instead of table salt  or sea salt. Check with your health care provider or pharmacist before using salt substitutes.  Do not fry foods. Cook foods using healthy methods such as baking, boiling, grilling, and broiling instead.  Cook with heart-healthy oils, such as olive, canola, soybean, or sunflower oil. Meal planning  Eat a balanced diet that includes: ? 5 or more servings of fruits and vegetables each day. At each meal, try to fill half of your plate with fruits and vegetables. ? Up to 6-8 servings of whole grains each day. ? Less than 6 oz of lean meat, poultry, or fish each day. A 3-oz serving of meat is about the same size as a deck of cards. One egg equals 1 oz. ? 2 servings of low-fat dairy each day. ? A serving of nuts, seeds, or beans 5 times each week. ? Heart-healthy fats. Healthy fats called Omega-3 fatty acids are found in foods such as flaxseeds and coldwater fish, like sardines, salmon, and mackerel.  Limit how much you eat of the following: ? Canned or prepackaged foods. ? Food that is high in trans fat, such as fried foods. ? Food that is high in saturated fat, such as fatty meat. ? Sweets, desserts, sugary drinks, and other foods with added sugar. ? Full-fat dairy products.  Do not salt foods before eating.  Try to eat at least 2 vegetarian meals each week.  Eat more home-cooked food and less  restaurant, buffet, and fast food.  When eating at a restaurant, ask that your food be prepared with less salt or no salt, if possible. What foods are recommended? The items listed may not be a complete list. Talk with your dietitian about what dietary choices are best for you. Grains Whole-grain or whole-wheat bread. Whole-grain or whole-wheat pasta. Brown rice. [place] Morrow. Bulgur. Whole-grain and low-sodium cereals. Pita bread. Low-fat, low-sodium crackers. Whole-wheat flour tortillas. Vegetables Fresh or frozen vegetables (raw, steamed, roasted, or grilled). Low-sodium or reduced-sodium  tomato and vegetable juice. Low-sodium or reduced-sodium tomato sauce and tomato paste. Low-sodium or reduced-sodium canned vegetables. Fruits All fresh, dried, or frozen fruit. Canned fruit in natural juice (without added sugar). Meat and other protein foods Skinless chicken or [place]. Ground chicken or [place]. Pork with fat trimmed off. Fish and seafood. Egg whites. Dried beans, peas, or lentils. Unsalted nuts, nut butters, and seeds. Unsalted canned beans. Lean cuts of beef with fat trimmed off. Low-sodium, lean deli meat. Dairy Low-fat (1%) or fat-free (skim) milk. Fat-free, low-fat, or reduced-fat cheeses. Nonfat, low-sodium ricotta or cottage cheese. Low-fat or nonfat yogurt. Low-fat, low-sodium cheese. Fats and oils Soft margarine without trans fats. Vegetable oil. Low-fat, reduced-fat, or light mayonnaise and salad dressings (reduced-sodium). Canola, safflower, olive, soybean, and sunflower oils. Avocado. Seasoning and other foods Herbs. Spices. Seasoning mixes without salt. Unsalted popcorn and pretzels. Fat-free sweets. What foods are not recommended? The items listed may not be a complete list. Talk with your dietitian about what dietary choices are best for you. Grains Baked goods made with fat, such as croissants, muffins, or some breads. Dry pasta or rice meal packs. Vegetables Creamed or fried vegetables. Vegetables in a cheese sauce. Regular canned vegetables (not low-sodium or reduced-sodium). Regular canned tomato sauce and paste (not low-sodium or reduced-sodium). Regular tomato and vegetable juice (not low-sodium or reduced-sodium). Angie Fava. Olives. Fruits Canned fruit in a light or heavy syrup. Fried fruit. Fruit in cream or butter sauce. Meat and other protein foods Fatty cuts of meat. Ribs. Fried meat. Berniece Salines. Sausage. Bologna and other processed lunch meats. Salami. Fatback. Hotdogs. Bratwurst. Salted nuts and seeds. Canned beans with added salt. Canned or smoked fish.  Whole eggs or egg yolks. Chicken or [place] with skin. Dairy Whole or 2% milk, cream, and half-and-half. Whole or full-fat cream cheese. Whole-fat or sweetened yogurt. Full-fat cheese. Nondairy creamers. Whipped toppings. Processed cheese and cheese spreads. Fats and oils Butter. Stick margarine. Lard. Shortening. Ghee. Bacon fat. Tropical oils, such as coconut, palm kernel, or palm oil. Seasoning and other foods Salted popcorn and pretzels. Onion salt, garlic salt, seasoned salt, table salt, and sea salt. Worcestershire sauce. Tartar sauce. Barbecue sauce. Teriyaki sauce. Soy sauce, including reduced-sodium. Steak sauce. Canned and packaged gravies. Fish sauce. Oyster sauce. Cocktail sauce. Horseradish that you find on the shelf. Ketchup. Mustard. Meat flavorings and tenderizers. Bouillon cubes. Hot sauce and Tabasco sauce. Premade or packaged marinades. Premade or packaged taco seasonings. Relishes. Regular salad dressings. Where to find more information:  National Heart, Lung, and [place]: https://wilson-eaton.com/  American Heart Association: www.heart.org Summary  The DASH eating plan is a healthy eating plan that has been shown to reduce high blood pressure (hypertension). It may also reduce your risk for type 2 diabetes, heart disease, and stroke.  With the DASH eating plan, you should limit salt (sodium) intake to 2,300 mg a day. If you have hypertension, you may need to reduce your sodium intake to 1,500 mg a day.  When on the DASH eating plan, aim to eat more fresh fruits and vegetables, whole grains, lean proteins, low-fat dairy, and heart-healthy fats.  Work with your health care provider or diet and nutrition specialist (dietitian) to adjust your eating plan to your individual calorie needs. This information is not intended to replace advice given to you by your health care provider. Make sure you discuss any questions you have with your health care provider. Document Released:  06/26/2011 Document Revised: 06/30/2016 Document Reviewed: 06/30/2016 Elsevier Interactive Patient Education  2019 [employer].

## 2018-09-03 NOTE — Progress Notes (Signed)
[facility]   Progress Note: General Provider: Mike Gip, FNP  SUBJECTIVE:   Julian Atkinson is a 53 y.o. male who  has a past medical history of GERD (gastroesophageal reflux disease), Hypertension, Migraines, and Renal disorder.. Patient presents today for Hypertension; Diabetes; and Hyperlipidemia   Patient reports compliance with all medications. Patient denies side effects of medications. He continues to smoke 1 ppd. Patient is not interested in quitting. He walks for exercise daily. Does not follow a carb modified or low sodium diet.  Denies problems or concerns today.   Review of Systems  Constitutional: Negative.   HENT: Negative.   Eyes: Negative.   Respiratory: Negative.   Cardiovascular: Negative.   Gastrointestinal: Negative.   Genitourinary: Negative.   Musculoskeletal: Negative.   Skin: Negative.   Neurological: Negative.   Psychiatric/Behavioral: Negative.      OBJECTIVE: BP 115/71 (BP Location: Left Arm, Patient Position: Sitting, Cuff Size: Large)   Pulse 70   Temp 97.9 F (36.6 C) (Oral)   Resp 14   Ht 5\' 9"  (1.753 m)   Wt 221 lb (100.2 kg)   SpO2 96%   BMI 32.64 kg/m   Wt Readings from Last 3 Encounters:  09/03/18 221 lb (100.2 kg)  03/03/18 205 lb (93 kg)  09/02/17 220 lb (99.8 kg)     Physical Exam Vitals signs and nursing note reviewed.  Constitutional:      General: He is not in acute distress.    Appearance: He is well-developed.  HENT:     Head: Normocephalic and atraumatic.  Eyes:     Conjunctiva/sclera: Conjunctivae normal.     Pupils: Pupils are equal, round, and reactive to light.  Neck:     Musculoskeletal: Normal range of motion.  Cardiovascular:     Rate and Rhythm: Normal rate and regular rhythm.     Heart sounds: Normal heart sounds.  Pulmonary:     Effort: Pulmonary effort is normal. No respiratory distress.     Breath sounds: Normal breath sounds.  Abdominal:   General: Bowel sounds are normal. There is no distension.     Palpations: Abdomen is soft.  Musculoskeletal: Normal range of motion.  Skin:    General: Skin is warm and dry.  Neurological:     Mental Status: He is alert and oriented to person, place, and time.  Psychiatric:        Behavior: Behavior normal.        Thought Content: Thought content normal.     ASSESSMENT/PLAN:   1. Essential hypertension Discussed low sodium diet. Encouraged a heart healthy diet and exercise.  - Urinalysis Dipstick - hydrochlorothiazide (HYDRODIURIL) 12.5 MG tablet; Take 1 tablet (12.5 mg total) by mouth daily.  Dispense: 30 tablet; Refill: 5 - lisinopril (ZESTRIL) 2.5 MG tablet; Take 1 tablet (2.5 mg total) by mouth daily.  Dispense: 30 tablet; Refill: 11  2. Type 2 diabetes mellitus with other neurologic complication, without long-term current use of insulin (HCC) The patient is asked to make an attempt to improve diet and exercise patterns to aid in medical management of this problem.  - Urinalysis Dipstick - HgB A1c - metFORMIN (GLUCOPHAGE) 500 MG tablet; TAKE 1 TABLET BY MOUTH 2 TIMES DAILY WITH A MEAL.  Dispense: 60 tablet; Refill: 5  3. Chronic obstructive pulmonary disease, unspecified COPD type (HCC) Encouraged smoking cessation. Patient is not ready to quit.  - albuterol (PROVENTIL HFA;VENTOLIN HFA) 108 (90 Base) MCG/ACT  inhaler; Inhale 2 puffs into the lungs every 6 (six) hours as needed for wheezing or shortness of breath.  Dispense: 1 each; Refill: 11  4. Hypertriglyceridemia Pending labs. Will adjust medications accordingly.    - aspirin EC 81 MG tablet; Take 1 tablet (81 mg total) by mouth daily.  Dispense: 90 tablet; Refill: 3 - fenofibrate (TRICOR) 145 MG tablet; Take 1 tablet (145 mg total) by mouth daily.  Dispense: 30 tablet; Refill: 11 - Comprehensive metabolic panel - Lipid Panel  5. Vertigo - meclizine (ANTIVERT) 25 MG tablet; TAKE 1 TABLET BY MOUTH 3 TIMES DAILY AS NEEDED  FOR DIZZINESS.  Dispense: 60 tablet; Refill: 1  6. Gastroesophageal reflux disease without esophagitis - omeprazole (PRILOSEC) 20 MG capsule; Take 1 capsule (20 mg total) by mouth daily.  Dispense: 30 capsule; Refill: 5  Smoking cessation instruction/counseling given:  counseled patient on the dangers of tobacco use, advised patient to stop smoking, and reviewed strategies to maximize success   Return in about 6 months (around 03/04/2019) for DM2-A1c.    The patient was given clear instructions to go to ER or return to medical center if symptoms do not improve, worsen or new problems develop. The patient verbalized understanding and agreed with plan of care.   Ms. Freda Jackson. Riley Lam, FNP-BC [facility] [address] [place], [place] [address] [phone]

## 2018-09-04 LAB — COMPREHENSIVE METABOLIC PANEL
ALT: 18 IU/L (ref 0–44)
AST: 12 IU/L (ref 0–40)
Albumin/Globulin Ratio: 1.9 (ref 1.2–2.2)
Albumin: 4.4 g/dL (ref 3.8–4.9)
Alkaline Phosphatase: 48 IU/L (ref 39–117)
BUN/Creatinine Ratio: 16 (ref 9–20)
BUN: 13 mg/dL (ref 6–24)
Bilirubin Total: 0.3 mg/dL (ref 0.0–1.2)
CO2: 23 mmol/L (ref 20–29)
Calcium: 9.6 mg/dL (ref 8.7–10.2)
Chloride: 98 mmol/L (ref 96–106)
Creatinine, Ser: 0.83 mg/dL (ref 0.76–1.27)
GFR calc Af Amer: 116 mL/min/{1.73_m2} (ref >59)
GFR calc non Af Amer: 100 mL/min/{1.73_m2} (ref >59)
Globulin, Total: 2.3 g/dL (ref 1.5–4.5)
Glucose: 116 mg/dL — ABNORMAL HIGH (ref 65–99)
Potassium: 4.3 mmol/L (ref 3.5–5.2)
Sodium: 138 mmol/L (ref 134–144)
Total Protein: 6.7 g/dL (ref 6.0–8.5)

## 2018-09-04 LAB — LIPID PANEL
Chol/HDL Ratio: 6 ratio — ABNORMAL HIGH (ref 0.0–5.0)
Cholesterol, Total: 205 mg/dL — ABNORMAL HIGH (ref 100–199)
HDL: 34 mg/dL — ABNORMAL LOW (ref >39)
LDL Calculated: 139 mg/dL — ABNORMAL HIGH (ref 0–99)
Triglycerides: 159 mg/dL — ABNORMAL HIGH (ref 0–149)
VLDL Cholesterol Cal: 32 mg/dL (ref 5–40)

## 2018-09-15 MED ORDER — ROSUVASTATIN CALCIUM 10 MG PO TABS: 10.0000 mg | ORAL_TABLET | Freq: Every day | ORAL | 3 refills | Status: DC

## 2018-09-15 MED FILL — ROSUVASTATIN CALCIUM 10 MG: 10 | 30 days supply | Qty: 30 | Fill #0

## 2018-09-15 NOTE — Addendum Note (Signed)
Addended by: Reatha Harps on: 09/15/2018 08:14 AM   Modules accepted: Orders

## 2018-09-16 ENCOUNTER — Telehealth: Payer: Self-pay

## 2018-09-16 NOTE — Telephone Encounter (Signed)
Called and spoke with Jasmine December ([facility], on [employer]) Advised that LDL was increasing and that we are going to add crestor to his medication regimen. Asked that he take this every night as directed. Asked that he work on [employer] in diet and exercise 150 minutes weekly of moderate intensity exercise. Jasmine December verbalized understanding and will advise him of this. Thanks!

## 2018-09-16 NOTE — Telephone Encounter (Signed)
-----   Message from Mike Gip, FNP sent at 09/15/2018  8:17 AM EST ----- Patient with improved triglycerides, but LDL (bad cholesterol) is increasing. I am going to add crestor to his regimen. He is to take this every night. He also needs to decrease fatty food intake. The 2018 Physical Activity Guidelines recommend the equivalent of 150 minutes per week of moderate to vigorous aerobic activity each week, with muscle-strengthening activities on two days during the week    PRESCRIPTION FOR EXERCISE  Frequency Four to five days per week Intensity Moderate- During moderate intensity exercise, a person is too winded to sing but is not so winded they cannot talk Time 30 minutes or a total of 150 minutes per week.  Type Brisk walking

## 2018-10-04 MED FILL — OMEPRAZOLE 20 MG CAP: 20 | 30 days supply | Qty: 30 | Fill #1

## 2018-10-04 MED FILL — !VENTOLIN HFA INHALER: 108 (90 BAS | 25 days supply | Qty: 18 | Fill #1

## 2018-10-04 MED FILL — FENOFIBRATE 145 MG TABLET: 145 | 30 days supply | Qty: 30 | Fill #1

## 2018-10-04 MED FILL — LISINOPRIL 2.5 MG TABLET: 2.5 | 30 days supply | Qty: 30 | Fill #1

## 2018-10-04 MED FILL — HYDROCHLOROTHIAZIDE 12.5 MG: 12.5 | 30 days supply | Qty: 30 | Fill #1

## 2018-10-04 MED FILL — MECLIZINE 25 MG TABLET: 25 | 20 days supply | Qty: 60 | Fill #1

## 2018-10-04 MED FILL — metFORMIN HCL 500 MG TABS: 500 | 30 days supply | Qty: 60 | Fill #1

## 2018-10-05 MED FILL — ROSUVASTATIN CALCIUM 10 MG: 10 | 30 days supply | Qty: 30 | Fill #1

## 2018-11-08 MED FILL — OMEPRAZOLE 20 MG CAP: 20 | 30 days supply | Qty: 30 | Fill #2

## 2018-11-08 MED FILL — FENOFIBRATE 145 MG TABLET: 145 | 30 days supply | Qty: 30 | Fill #2

## 2018-11-08 MED FILL — HYDROCHLOROTHIAZIDE 12.5 MG: 12.5 | 30 days supply | Qty: 30 | Fill #2

## 2018-11-08 MED FILL — ROSUVASTATIN CALCIUM 10 MG: 10 | 30 days supply | Qty: 30 | Fill #2

## 2018-11-08 MED FILL — metFORMIN HCL 500 MG TABS: 500 | 30 days supply | Qty: 60 | Fill #2

## 2018-11-08 MED FILL — LISINOPRIL 2.5 MG TABLET: 2.5 | 30 days supply | Qty: 30 | Fill #2

## 2018-11-08 MED FILL — !VENTOLIN HFA INHALER: 108 (90 BAS | 25 days supply | Qty: 18 | Fill #2

## 2018-12-09 MED FILL — FENOFIBRATE 145 MG TABS: 145 | 30 days supply | Qty: 30 | Fill #3

## 2018-12-09 MED FILL — !VENTOLIN HFA INHALER: 108 (90 BAS | 25 days supply | Qty: 18 | Fill #3

## 2018-12-09 MED FILL — OMEPRAZOLE 20 MG CAP: 20 | 30 days supply | Qty: 30 | Fill #3

## 2018-12-09 MED FILL — metFORMIN HCL 500 MG TABS: 500 | 30 days supply | Qty: 60 | Fill #3

## 2018-12-09 MED FILL — HYDROCHLOROTHIAZIDE 12.5 MG: 12.5 | 30 days supply | Qty: 30 | Fill #3

## 2018-12-09 MED FILL — ROSUVASTATIN CALCIUM 10 MG: 10 | 30 days supply | Qty: 30 | Fill #3

## 2018-12-09 MED FILL — LISINOPRIL 2.5 MG TABLET: 2.5 | 30 days supply | Qty: 30 | Fill #3

## 2019-01-03 ENCOUNTER — Telehealth: Payer: Self-pay

## 2019-01-03 DIAGNOSIS — R42 Dizziness and giddiness: Secondary | ICD-10-CM

## 2019-01-04 MED ORDER — MECLIZINE HCL 25 MG PO TABS: ORAL_TABLET | ORAL | 1 refills | Status: DC

## 2019-01-04 NOTE — Telephone Encounter (Signed)
This has been filled 

## 2019-01-06 MED FILL — FENOFIBRATE 145 MG TABS: 145 | 30 days supply | Qty: 30 | Fill #4

## 2019-01-06 MED FILL — HYDROCHLOROTHIAZIDE 12.5 MG: 12.5 | 30 days supply | Qty: 30 | Fill #4

## 2019-01-06 MED FILL — !VENTOLIN HFA INHALER: 108 (90 BAS | 25 days supply | Qty: 18 | Fill #4

## 2019-01-06 MED FILL — metFORMIN HCL 500 MG TABS: 500 | 30 days supply | Qty: 60 | Fill #4

## 2019-01-06 MED FILL — OMEPRAZOLE 20 MG CAP: 20 | 30 days supply | Qty: 30 | Fill #4

## 2019-01-06 MED FILL — LISINOPRIL 2.5 MG TABLET: 2.5 | 30 days supply | Qty: 30 | Fill #4

## 2019-01-06 MED FILL — ROSUVASTATIN CALCIUM 10 MG: 10 | 30 days supply | Qty: 30 | Fill #4

## 2019-01-06 MED FILL — MECLIZINE 25 MG TABLET: 25 | 20 days supply | Qty: 60 | Fill #0

## 2019-02-04 MED FILL — FENOFIBRATE 145 MG TABS: 145 | 30 days supply | Qty: 30 | Fill #5

## 2019-02-04 MED FILL — LISINOPRIL 2.5 MG TABLET: 2.5 | 30 days supply | Qty: 30 | Fill #5

## 2019-02-04 MED FILL — metFORMIN HCL 500 MG TABS: 500 | 30 days supply | Qty: 60 | Fill #5

## 2019-02-04 MED FILL — OMEPRAZOLE 20 MG CAP: 20 | 30 days supply | Qty: 30 | Fill #5

## 2019-02-04 MED FILL — ROSUVASTATIN CALCIUM 10 MG: 10 | 30 days supply | Qty: 30 | Fill #5

## 2019-02-04 MED FILL — MECLIZINE 25 MG TABLET: 25 | 20 days supply | Qty: 60 | Fill #1

## 2019-02-04 MED FILL — !VENTOLIN HFA INHALER: 108 (90 BAS | 25 days supply | Qty: 18 | Fill #5

## 2019-02-04 MED FILL — HYDROCHLOROTHIAZIDE 12.5 MG: 12.5 | 30 days supply | Qty: 30 | Fill #5

## 2019-03-04 ENCOUNTER — Other Ambulatory Visit: Payer: Self-pay

## 2019-03-04 ENCOUNTER — Ambulatory Visit (INDEPENDENT_AMBULATORY_CARE_PROVIDER_SITE_OTHER): Payer: Self-pay | Admitting: Family Medicine

## 2019-03-04 ENCOUNTER — Encounter: Payer: Self-pay | Admitting: Family Medicine

## 2019-03-04 ENCOUNTER — Ambulatory Visit: Payer: Self-pay | Admitting: Family Medicine

## 2019-03-04 VITALS — BP 139/65 | HR 67 | Temp 97.7°F | Resp 16 | Ht 69.0 in | Wt 222.0 lb

## 2019-03-04 DIAGNOSIS — E1149 Type 2 diabetes mellitus with other diabetic neurological complication: Secondary | ICD-10-CM

## 2019-03-04 DIAGNOSIS — E781 Pure hyperglyceridemia: Secondary | ICD-10-CM

## 2019-03-04 DIAGNOSIS — K219 Gastro-esophageal reflux disease without esophagitis: Secondary | ICD-10-CM

## 2019-03-04 DIAGNOSIS — J449 Chronic obstructive pulmonary disease, unspecified: Secondary | ICD-10-CM

## 2019-03-04 DIAGNOSIS — I10 Essential (primary) hypertension: Secondary | ICD-10-CM

## 2019-03-04 DIAGNOSIS — R42 Dizziness and giddiness: Secondary | ICD-10-CM

## 2019-03-04 LAB — POCT URINALYSIS DIPSTICK
Bilirubin, UA: NEGATIVE
Blood, UA: NEGATIVE
Glucose, UA: POSITIVE — AB
Ketones, UA: NEGATIVE
Leukocytes, UA: NEGATIVE
Nitrite, UA: NEGATIVE
Protein, UA: NEGATIVE
Spec Grav, UA: 1.025 (ref 1.010–1.025)
Urobilinogen, UA: 1 E.U./dL
pH, UA: 5.5 (ref 5.0–8.0)

## 2019-03-04 LAB — POCT GLYCOSYLATED HEMOGLOBIN (HGB A1C): Hemoglobin A1C: 7.7 % — AB (ref 4.0–5.6)

## 2019-03-04 MED ORDER — MECLIZINE HCL 25 MG PO TABS: ORAL_TABLET | ORAL | 1 refills | Status: DC

## 2019-03-04 MED ORDER — HYDROCHLOROTHIAZIDE 12.5 MG PO TABS: 12.5000 mg | ORAL_TABLET | Freq: Every day | ORAL | 5 refills | Status: DC

## 2019-03-04 MED ORDER — METFORMIN HCL 500 MG PO TABS: ORAL_TABLET | ORAL | 5 refills | Status: DC

## 2019-03-04 MED ORDER — METFORMIN HCL 1000 MG PO TABS: ORAL_TABLET | ORAL | 2 refills | Status: DC

## 2019-03-04 MED ORDER — FENOFIBRATE 145 MG PO TABS: 145.0000 mg | ORAL_TABLET | Freq: Every day | ORAL | 11 refills | Status: DC

## 2019-03-04 MED ORDER — ASPIRIN EC 81 MG PO TBEC: 81.0000 mg | DELAYED_RELEASE_TABLET | Freq: Every day | ORAL | 3 refills | Status: DC

## 2019-03-04 MED ORDER — OMEPRAZOLE 20 MG PO CPDR: 20.0000 mg | DELAYED_RELEASE_CAPSULE | Freq: Every day | ORAL | 5 refills | Status: DC

## 2019-03-04 MED ORDER — ROSUVASTATIN CALCIUM 10 MG PO TABS: 10.0000 mg | ORAL_TABLET | Freq: Every day | ORAL | 3 refills | Status: DC

## 2019-03-04 MED ORDER — ALBUTEROL SULFATE HFA 108 (90 BASE) MCG/ACT IN AERS: 2.0000 | INHALATION_SPRAY | Freq: Four times a day (QID) | RESPIRATORY_TRACT | 5 refills | Status: DC | PRN

## 2019-03-04 MED FILL — FENOFIBRATE 145 MG TAB: 145 | 30 days supply | Qty: 30 | Fill #0

## 2019-03-04 MED FILL — ?ROSUVASTATIN CALCIUM 10 MG: 10 | 30 days supply | Qty: 30 | Fill #0

## 2019-03-04 MED FILL — $VENTOLIN HFA 18G INHALER: 108 (90 BAS | 25 days supply | Qty: 18 | Fill #0

## 2019-03-04 MED FILL — HYDROCHLOROTHIAZIDE 12.5 MG: 12.5 | 30 days supply | Qty: 30 | Fill #0

## 2019-03-04 MED FILL — ?OMEPRAZOLE 20MG CAP DR: 20 | 30 days supply | Qty: 30 | Fill #0

## 2019-03-04 MED FILL — metFORMIN HCL 1000 MG TABS: 1000 | 30 days supply | Qty: 60 | Fill #0

## 2019-03-04 MED FILL — ?MECLIZINE 25MG TAB: 25 | 20 days supply | Qty: 60 | Fill #0

## 2019-03-04 NOTE — Progress Notes (Signed)
[facility]   Progress Note: General Provider: Mike GipAndre Kloee Ballew, FNP  SUBJECTIVE:   Julian Atkinson is a 53 y.o. male who  has a past medical history of GERD (gastroesophageal reflux disease), Hypertension, Migraines, and Renal disorder.. Patient presents today for Hypertension, Diabetes, and Dizziness (pt states after he takes lisinopril he feels dizzy ) Patient reports dizziness after taking lisinopril. He discontinued it. BP in normal range today.  He does not check his CBGs or blood pressure at home. No reports of regular exercise. He states that he has been drinking large amounts of sweet tea since his last visit.   Review of Systems  Constitutional: Negative.   HENT: Negative.   Eyes: Negative.   Respiratory: Negative.   Cardiovascular: Negative.   Gastrointestinal: Negative.   Genitourinary: Negative.   Musculoskeletal: Negative.   Skin: Negative.   Neurological: Negative.   Psychiatric/Behavioral: Negative.      OBJECTIVE: BP 139/65 (BP Location: Left Arm, Patient Position: Sitting, Cuff Size: Large)   Pulse 67   Temp 97.7 F (36.5 C) (Oral)   Resp 16   Ht 5\' 9"  (1.753 m)   Wt 222 lb (100.7 kg)   SpO2 96%   BMI 32.78 kg/m   Wt Readings from Last 3 Encounters:  03/04/19 222 lb (100.7 kg)  09/03/18 221 lb (100.2 kg)  03/03/18 205 lb (93 kg)     Physical Exam Vitals signs and nursing note reviewed.  Constitutional:      General: He is not in acute distress.    Appearance: Normal appearance.  HENT:     Head: Normocephalic and atraumatic.  Eyes:     Extraocular Movements: Extraocular movements intact.     Conjunctiva/sclera: Conjunctivae normal.     Pupils: Pupils are equal, round, and reactive to light.  Cardiovascular:     Rate and Rhythm: Normal rate and regular rhythm.     Heart sounds: No murmur.  Pulmonary:     Effort: Pulmonary effort is normal.     Breath sounds: Normal breath sounds.   Musculoskeletal: Normal range of motion.  Skin:    General: Skin is warm and dry.  Neurological:     Mental Status: He is alert and oriented to person, place, and time.  Psychiatric:        Mood and Affect: Mood normal.        Behavior: Behavior normal.        Thought Content: Thought content normal.        Judgment: Judgment normal.    Diabetic Foot Exam - Simple   Simple Foot Form Diabetic Foot exam was performed with the following findings: Yes 03/04/2019  9:15 AM  Visual Inspection No deformities, no ulcerations, no other skin breakdown bilaterally: Yes Sensation Testing Intact to touch and monofilament testing bilaterally: Yes Pulse Check Posterior Tibialis and Dorsalis pulse intact bilaterally: Yes Comments     ASSESSMENT/PLAN:   1. Type 2 diabetes mellitus with other neurologic complication, without long-term current use of insulin (HCC) We discussed the importance of limiting sweets and eating a carb modified diet.  - HgB A1c increased to 7.7 - metFORMIN (GLUCOPHAGE) 500 MG tablet; TAKE 1 TABLET BY MOUTH 2 TIMES DAILY WITH A MEAL.  Dispense: 60 tablet; Refill: 5  2. Essential hypertension Will continue to monitor his BP readings. Stopped Lisinopril. Discussed renal protective qualities of this medication with being a diabetic.  - Urinalysis Dipstick - hydrochlorothiazide (HYDRODIURIL) 12.5 MG  tablet; Take 1 tablet (12.5 mg total) by mouth daily.  Dispense: 30 tablet; Refill: 5  3. Chronic obstructive pulmonary disease, unspecified COPD type (HCC) - albuterol (VENTOLIN HFA) 108 (90 Base) MCG/ACT inhaler; Inhale 2 puffs into the lungs every 6 (six) hours as needed for wheezing or shortness of breath.  4. Hypertriglyceridemia - aspirin EC 81 MG tablet; Take 1 tablet (81 mg total) by mouth daily.  Dispense: 90 tablet; Refill: 3 - fenofibrate (TRICOR) 145 MG tablet; Take 1 tablet (145 mg total) by mouth daily.  Dispense: 30 tablet; Refill: 11  5. Vertigo - meclizine  (ANTIVERT) 25 MG tablet; TAKE 1 TABLET BY MOUTH 3 TIMES DAILY AS NEEDED FOR DIZZINESS.  Dispense: 60 tablet; Refill: 1  6. Gastroesophageal reflux disease without esophagitis - omeprazole (PRILOSEC) 20 MG capsule; Take 1 capsule (20 mg total) by mouth daily.  Dispense: 30 capsule; Refill: 5    Return in about 3 months (around 06/04/2019) for DM needs A1c.    The patient was given clear instructions to go to ER or return to medical center if symptoms do not improve, worsen or new problems develop. The patient verbalized understanding and agreed with plan of care.   Ms. Doug Sou. Nathaneil Canary, FNP-BC [facility] [address] [place], [place] [address] [phone]

## 2019-03-04 NOTE — Patient Instructions (Signed)
Health Maintenance, Male Adopting a healthy lifestyle and getting preventive care are important in promoting health and wellness. Ask your health care provider about:  The right schedule for you to have regular tests and exams.  Things you can do on your own to prevent diseases and keep yourself healthy. What should I know about diet, weight, and exercise? Eat a healthy diet   Eat a diet that includes plenty of vegetables, fruits, low-fat dairy products, and lean protein.  Do not eat a lot of foods that are high in solid fats, added sugars, or sodium. Maintain a healthy weight Body mass index (BMI) is a measurement that can be used to identify possible weight problems. It estimates body fat based on height and weight. Your health care provider can help determine your BMI and help you achieve or maintain a healthy weight. Get regular exercise Get regular exercise. This is one of the most important things you can do for your health. Most adults should:  Exercise for at least 150 minutes each week. The exercise should increase your heart rate and make you sweat (moderate-intensity exercise).  Do strengthening exercises at least twice a week. This is in addition to the moderate-intensity exercise.  Spend less time sitting. Even light physical activity can be beneficial. Watch cholesterol and blood lipids Have your blood tested for lipids and cholesterol at 54 years of age, then have this test every 5 years. You may need to have your cholesterol levels checked more often if:  Your lipid or cholesterol levels are high.  You are older than 54 years of age.  You are at high risk for heart disease. What should I know about cancer screening? Many types of cancers can be detected early and may often be prevented. Depending on your health history and family history, you may need to have cancer screening at various ages. This may include screening for:  Colorectal cancer.  Prostate cancer.   Skin cancer.  Lung cancer. What should I know about heart disease, diabetes, and high blood pressure? Blood pressure and heart disease  High blood pressure causes heart disease and increases the risk of stroke. This is more likely to develop in people who have high blood pressure readings, are of African descent, or are overweight.  Talk with your health care provider about your target blood pressure readings.  Have your blood pressure checked: ? Every 3-5 years if you are 79-12 years of age. ? Every year if you are 26 years old or older.  If you are between the ages of 57 and 39 and are a current or former smoker, ask your health care provider if you should have a one-time screening for abdominal aortic aneurysm (AAA). Diabetes Have regular diabetes screenings. This checks your fasting blood sugar level. Have the screening done:  Once every three years after age 15 if you are at a normal weight and have a low risk for diabetes.  More often and at a younger age if you are overweight or have a high risk for diabetes. What should I know about preventing infection? Hepatitis B If you have a higher risk for hepatitis B, you should be screened for this virus. Talk with your health care provider to find out if you are at risk for hepatitis B infection. Hepatitis C Blood testing is recommended for:  Everyone born from [birth year] through 1966.  Anyone with known risk factors for hepatitis C. Sexually transmitted infections (STIs)  You should be screened each year  for STIs, including gonorrhea and chlamydia, if: ? You are sexually active and are younger than 54 years of age. ? You are older than 54 years of age and your health care provider tells you that you are at risk for this type of infection. ? Your sexual activity has changed since you were last screened, and you are at increased risk for chlamydia or gonorrhea. Ask your health care provider if you are at risk.  Ask your health care  provider about whether you are at high risk for HIV. Your health care provider may recommend a prescription medicine to help prevent HIV infection. If you choose to take medicine to prevent HIV, you should first get tested for HIV. You should then be tested every 3 months for as long as you are taking the medicine. Follow these instructions at home: Lifestyle  Do not use any products that contain nicotine or tobacco, such as cigarettes, e-cigarettes, and chewing tobacco. If you need help quitting, ask your health care provider.  Do not use street drugs.  Do not share needles.  Ask your health care provider for help if you need support or information about quitting drugs. Alcohol use  Do not drink alcohol if your health care provider tells you not to drink.  If you drink alcohol: ? Limit how much you have to 0-2 drinks a day. ? Be aware of how much alcohol is in your drink. In the [place] one drink equals one 12 oz bottle of beer (355 mL), one 5 oz glass of wine (148 mL), or one 1 oz glass of hard liquor (44 mL). General instructions  Schedule regular health, dental, and eye exams.  Stay current with your vaccines.  Tell your health care provider if: ? You often feel depressed. ? You have ever been abused or do not feel safe at home. Summary  Adopting a healthy lifestyle and getting preventive care are important in promoting health and wellness.  Follow your health care provider's instructions about healthy diet, exercising, and getting tested or screened for diseases.  Follow your health care provider's instructions on monitoring your cholesterol and blood pressure. This information is not intended to replace advice given to you by your health care provider. Make sure you discuss any questions you have with your health care provider. Document Released: 01/03/2008 Document Revised: 06/30/2018 Document Reviewed: 06/30/2018 Elsevier Patient Education  2020 [employer] Food Choices  for Gastroesophageal Reflux Disease, Adult When you have gastroesophageal reflux disease (GERD), the foods you eat and your eating habits are very important. Choosing the right foods can help ease your discomfort. Think about working with a nutrition specialist (dietitian) to help you make good choices. What are tips for following this plan?  Meals  Choose healthy foods that are low in fat, such as fruits, vegetables, whole grains, low-fat dairy products, and lean meat, fish, and poultry.  Eat small meals often instead of 3 large meals a day. Eat your meals slowly, and in a place where you are relaxed. Avoid bending over or lying down until 2-3 hours after eating.  Avoid eating meals 2-3 hours before bed.  Avoid drinking a lot of liquid with meals.  Cook foods using methods other than frying. Bake, grill, or broil food instead.  Avoid or limit: ? Chocolate. ? Peppermint or spearmint. ? Alcohol. ? Pepper. ? Black and decaffeinated coffee. ? Black and decaffeinated tea. ? Bubbly (carbonated) soft drinks. ? Caffeinated energy drinks and soft drinks.  Limit high-fat  foods such as: ? Fatty meat or fried foods. ? Whole milk, cream, butter, or ice cream. ? Nuts and nut butters. ? Pastries, donuts, and sweets made with butter or shortening.  Avoid foods that cause symptoms. These foods may be different for everyone. Common foods that cause symptoms include: ? Tomatoes. ? Oranges, lemons, and limes. ? Peppers. ? Spicy food. ? Onions and garlic. ? Vinegar. Lifestyle  Maintain a healthy weight. Ask your doctor what weight is healthy for you. If you need to lose weight, work with your doctor to do so safely.  Exercise for at least 30 minutes for 5 or more days each week, or as told by your doctor.  Wear loose-fitting clothes.  Do not smoke. If you need help quitting, ask your doctor.  Sleep with the head of your bed higher than your feet. Use a wedge under the mattress or blocks  under the bed frame to raise the head of the bed. Summary  When you have gastroesophageal reflux disease (GERD), food and lifestyle choices are very important in easing your symptoms.  Eat small meals often instead of 3 large meals a day. Eat your meals slowly, and in a place where you are relaxed.  Limit high-fat foods such as fatty meat or fried foods.  Avoid bending over or lying down until 2-3 hours after eating.  Avoid peppermint and spearmint, caffeine, alcohol, and chocolate. This information is not intended to replace advice given to you by your health care provider. Make sure you discuss any questions you have with your health care provider. Document Released: 01/06/2012 Document Revised: 10/28/2018 Document Reviewed: 08/12/2016 Elsevier Patient Education  2020 [place] DASH stands for "Dietary Approaches to Stop Hypertension." The DASH eating plan is a healthy eating plan that has been shown to reduce high blood pressure (hypertension). It may also reduce your risk for type 2 diabetes, heart disease, and stroke. The DASH eating plan may also help with weight loss. What are tips for following this plan?  General guidelines  Avoid eating more than 2,300 mg (milligrams) of salt (sodium) a day. If you have hypertension, you may need to reduce your sodium intake to 1,500 mg a day.  Limit alcohol intake to no more than 1 drink a day for nonpregnant women and 2 drinks a day for men. One drink equals 12 oz of beer, 5 oz of wine, or 1 oz of hard liquor.  Work with your health care provider to maintain a healthy body weight or to lose weight. Ask what an ideal weight is for you.  Get at least 30 minutes of exercise that causes your heart to beat faster (aerobic exercise) most days of the week. Activities may include walking, swimming, or biking.  Work with your health care provider or diet and nutrition specialist (dietitian) to adjust your eating plan to your  individual calorie needs. Reading food labels   Check food labels for the amount of sodium per serving. Choose foods with less than 5 percent of the Daily Value of sodium. Generally, foods with less than 300 mg of sodium per serving fit into this eating plan.  To find whole grains, look for the word "whole" as the first word in the ingredient list. Shopping  Buy products labeled as "low-sodium" or "no salt added."  Buy fresh foods. Avoid canned foods and premade or frozen meals. Cooking  Avoid adding salt when cooking. Use salt-free seasonings or herbs instead of table salt or  sea salt. Check with your health care provider or pharmacist before using salt substitutes.  Do not fry foods. Cook foods using healthy methods such as baking, boiling, grilling, and broiling instead.  Cook with heart-healthy oils, such as olive, canola, soybean, or sunflower oil. Meal planning  Eat a balanced diet that includes: ? 5 or more servings of fruits and vegetables each day. At each meal, try to fill half of your plate with fruits and vegetables. ? Up to 6-8 servings of whole grains each day. ? Less than 6 oz of lean meat, poultry, or fish each day. A 3-oz serving of meat is about the same size as a deck of cards. One egg equals 1 oz. ? 2 servings of low-fat dairy each day. ? A serving of nuts, seeds, or beans 5 times each week. ? Heart-healthy fats. Healthy fats called Omega-3 fatty acids are found in foods such as flaxseeds and coldwater fish, like sardines, salmon, and mackerel.  Limit how much you eat of the following: ? Canned or prepackaged foods. ? Food that is high in trans fat, such as fried foods. ? Food that is high in saturated fat, such as fatty meat. ? Sweets, desserts, sugary drinks, and other foods with added sugar. ? Full-fat dairy products.  Do not salt foods before eating.  Try to eat at least 2 vegetarian meals each week.  Eat more home-cooked food and less restaurant,  buffet, and fast food.  When eating at a restaurant, ask that your food be prepared with less salt or no salt, if possible. What foods are recommended? The items listed may not be a complete list. Talk with your dietitian about what dietary choices are best for you. Grains Whole-grain or whole-wheat bread. Whole-grain or whole-wheat pasta. Brown rice. Or[place] Cobbatmeal. Quinoa. Bulgur. Whole-grain and low-sodium cereals. Pita bread. Low-fat, low-sodium crackers. Whole-wheat flour tortillas. Vegetables Fresh or frozen vegetables (raw, steamed, roasted, or grilled). Low-sodium or reduced-sodium tomato and vegetable juice. Low-sodium or reduced-sodium tomato sauce and tomato paste. Low-sodium or reduced-sodium canned vegetables. Fruits All fresh, dried, or frozen fruit. Canned fruit in natural juice (without added sugar). Meat and other protein foods Skinless chicken or [place]key. Ground chicken or [place]key. Pork with fat trimmed off. Fish and seafood. Egg whites. Dried beans, peas, or lentils. Unsalted nuts, nut butters, and seeds. Unsalted canned beans. Lean cuts of beef with fat trimmed off. Low-sodium, lean deli meat. Dairy Low-fat (1%) or fat-free (skim) milk. Fat-free, low-fat, or reduced-fat cheeses. Nonfat, low-sodium ricotta or cottage cheese. Low-fat or nonfat yogurt. Low-fat, low-sodium cheese. Fats and oils Soft margarine without trans fats. Vegetable oil. Low-fat, reduced-fat, or light mayonnaise and salad dressings (reduced-sodium). Canola, safflower, olive, soybean, and sunflower oils. Avocado. Seasoning and other foods Herbs. Spices. Seasoning mixes without salt. Unsalted popcorn and pretzels. Fat-free sweets. What foods are not recommended? The items listed may not be a complete list. Talk with your dietitian about what dietary choices are best for you. Grains Baked goods made with fat, such as croissants, muffins, or some breads. Dry pasta or rice meal packs. Vegetables Creamed or fried  vegetables. Vegetables in a cheese sauce. Regular canned vegetables (not low-sodium or reduced-sodium). Regular canned tomato sauce and paste (not low-sodium or reduced-sodium). Regular tomato and vegetable juice (not low-sodium or reduced-sodium). Rosita FirePickles. Olives. Fruits Canned fruit in a light or heavy syrup. Fried fruit. Fruit in cream or butter sauce. Meat and other protein foods Fatty cuts of meat. Ribs. Fried meat. Tomasa BlaseBacon. Sausage. Bologna and  other processed lunch meats. Salami. Fatback. Hotdogs. Bratwurst. Salted nuts and seeds. Canned beans with added salt. Canned or smoked fish. Whole eggs or egg yolks. Chicken or [place]key with skin. Dairy Whole or 2% milk, cream, and half-and-half. Whole or full-fat cream cheese. Whole-fat or sweetened yogurt. Full-fat cheese. Nondairy creamers. Whipped toppings. Processed cheese and cheese spreads. Fats and oils Butter. Stick margarine. Lard. Shortening. Ghee. Bacon fat. Tropical oils, such as coconut, palm kernel, or palm oil. Seasoning and other foods Salted popcorn and pretzels. Onion salt, garlic salt, seasoned salt, table salt, and sea salt. Worcestershire sauce. Tartar sauce. Barbecue sauce. Teriyaki sauce. Soy sauce, including reduced-sodium. Steak sauce. Canned and packaged gravies. Fish sauce. Oyster sauce. Cocktail sauce. Horseradish that you find on the shelf. Ketchup. Mustard. Meat flavorings and tenderizers. Bouillon cubes. Hot sauce and Tabasco sauce. Premade or packaged marinades. Premade or packaged taco seasonings. Relishes. Regular salad dressings. Where to find more information:  National Heart, Lung, and Blood Institute: PopSteam.iswww.nhlbi.nih.gov  American Heart Association: www.heart.org Summary  The DASH eating plan is a healthy eating plan that has been shown to reduce high blood pressure (hypertension). It may also reduce your risk for type 2 diabetes, heart disease, and stroke.  With the DASH eating plan, you should limit salt (sodium)  intake to 2,300 mg a day. If you have hypertension, you may need to reduce your sodium intake to 1,500 mg a day.  When on the DASH eating plan, aim to eat more fresh fruits and vegetables, whole grains, lean proteins, low-fat dairy, and heart-healthy fats.  Work with your health care provider or diet and nutrition specialist (dietitian) to adjust your eating plan to your individual calorie needs. This information is not intended to replace advice given to you by your health care provider. Make sure you discuss any questions you have with your health care provider. Document Released: 06/26/2011 Document Revised: 06/19/2017 Document Reviewed: 06/30/2016 Elsevier Patient Education  2020 [employer]

## 2019-03-05 LAB — LIPID PANEL
Chol/HDL Ratio: 5.2 ratio — ABNORMAL HIGH (ref 0.0–5.0)
Cholesterol, Total: 141 mg/dL (ref 100–199)
HDL: 27 mg/dL — ABNORMAL LOW (ref >39)
LDL Calculated: 71 mg/dL (ref 0–99)
Triglycerides: 216 mg/dL — ABNORMAL HIGH (ref 0–149)
VLDL Cholesterol Cal: 43 mg/dL — ABNORMAL HIGH (ref 5–40)

## 2019-03-11 ENCOUNTER — Other Ambulatory Visit: Payer: Self-pay | Admitting: Family Medicine

## 2019-03-11 ENCOUNTER — Telehealth: Payer: Self-pay

## 2019-03-11 DIAGNOSIS — E781 Pure hyperglyceridemia: Secondary | ICD-10-CM

## 2019-03-11 MED ORDER — ROSUVASTATIN CALCIUM 20 MG PO TABS: 20.0000 mg | ORAL_TABLET | Freq: Every day | ORAL | 3 refills | Status: DC

## 2019-03-11 MED FILL — ?ROSUVASTATIN CALCIUM 20MG: 20 | 30 days supply | Qty: 30 | Fill #0

## 2019-03-11 NOTE — Telephone Encounter (Signed)
-----   Message from Lanae Boast, [place] sent at 03/11/2019  9:08 AM EDT ----- Please let patient know that I have increased his crestor from 10mg  to 20 mg because his triglycerides have increased. Consuming a heart healthy diet that includes whole foods such as fruits and vegetables, lean meats, and whole grains can help. Walking 30 minutes every day can also help keep your heart healthy.

## 2019-03-11 NOTE — Telephone Encounter (Signed)
Called and spoke with patient. Advised that we have increased crestor from10mg  to 20mg  due to triglycerides increased. Asked that he eat a heart healthy diet and exercise 30 minutes of walking every day.

## 2019-03-30 ENCOUNTER — Encounter (HOSPITAL_COMMUNITY): Payer: Self-pay

## 2019-03-30 ENCOUNTER — Encounter (HOSPITAL_COMMUNITY): Payer: Self-pay | Admitting: *Deleted

## 2019-04-06 MED FILL — ?ROSUVASTATIN CALCIUM 10 MG: 10 | 30 days supply | Qty: 30 | Fill #1

## 2019-04-06 MED FILL — metFORMIN HCL 1000 MG TABS: 1000 | 30 days supply | Qty: 60 | Fill #1

## 2019-04-06 MED FILL — ?FENOFIBRATE 145MG TABLET: 145 | 30 days supply | Qty: 30 | Fill #1

## 2019-04-06 MED FILL — ?MECLIZINE 25MG TAB: 25 | 20 days supply | Qty: 60 | Fill #1

## 2019-04-06 MED FILL — ?HYDROCHLOROTHIAZIDE 12.5M: 12.5 | 30 days supply | Qty: 30 | Fill #1

## 2019-04-06 MED FILL — ?OMEPRAZOLE 20MG CAP DR: 20 | 30 days supply | Qty: 30 | Fill #1

## 2019-04-29 MED FILL — ?OMEPRAZOLE 20MG CAP DR: 20 | 30 days supply | Qty: 30 | Fill #2

## 2019-04-29 MED FILL — ?HYDROCHLOROTHIAZIDE 12.5M: 12.5 | 30 days supply | Qty: 30 | Fill #2

## 2019-04-29 MED FILL — metFORMIN HCL 1000 MG TABS: 1000 | 30 days supply | Qty: 60 | Fill #2

## 2019-04-29 MED FILL — ?FENOFIBRATE 145MG TABLET: 145 | 30 days supply | Qty: 30 | Fill #2

## 2019-04-29 MED FILL — ?ROSUVASTATIN CALCIUM 20MG: 20 | 30 days supply | Qty: 30 | Fill #0

## 2019-05-02 MED FILL — LISINOPRIL 2.5 MG TABLET: 2.5 | 30 days supply | Qty: 30 | Fill #6

## 2019-06-06 ENCOUNTER — Ambulatory Visit: Payer: Self-pay | Admitting: Family Medicine

## 2019-06-08 ENCOUNTER — Other Ambulatory Visit: Payer: Self-pay | Admitting: Family Medicine

## 2019-06-08 DIAGNOSIS — R42 Dizziness and giddiness: Secondary | ICD-10-CM

## 2019-06-08 MED FILL — ?MECLIZINE 25MG TAB: 25 | 20 days supply | Qty: 60 | Fill #0

## 2019-06-08 MED FILL — LISINOPRIL 2.5 MG TABLET: 2.5 | 30 days supply | Qty: 30 | Fill #7

## 2019-06-08 MED FILL — ?FENOFIBRATE 145MG TABLET: 145 | 30 days supply | Qty: 30 | Fill #3

## 2019-06-08 MED FILL — metFORMIN HCL 1000 MG TABS: 1000 | 30 days supply | Qty: 60 | Fill #3

## 2019-06-08 MED FILL — ?OMEPRAZOLE 20MG CAP DR: 20 | 30 days supply | Qty: 30 | Fill #3

## 2019-06-08 MED FILL — ?HYDROCHLOROTHIAZIDE 12.5M: 12.5 | 30 days supply | Qty: 30 | Fill #3

## 2019-06-08 MED FILL — ?ROSUVASTATIN CALCIUM 20MG: 20 | 30 days supply | Qty: 30 | Fill #1

## 2019-07-07 MED FILL — metFORMIN HCL 1000 MG TABS: 1000 | 30 days supply | Qty: 60 | Fill #4

## 2019-07-07 MED FILL — FENOFIBRATE 145 MG TABLET: 145 | 30 days supply | Qty: 30 | Fill #4

## 2019-07-07 MED FILL — LISINOPRIL 2.5 MG TABLET: 2.5 | 30 days supply | Qty: 30 | Fill #8

## 2019-07-07 MED FILL — ?OMEPRAZOLE 20MG CAP DR: 20 | 30 days supply | Qty: 30 | Fill #4

## 2019-07-07 MED FILL — ?ROSUVASTATIN CALCIUM 20MG: 20 | 30 days supply | Qty: 30 | Fill #2

## 2019-07-07 MED FILL — ?MECLIZINE 25MG TAB: 25 | 20 days supply | Qty: 60 | Fill #1

## 2019-07-07 MED FILL — HYDROCHLOROTHIAZIDE 12.5 MG: 12.5 | 30 days supply | Qty: 30 | Fill #4

## 2019-08-09 ENCOUNTER — Other Ambulatory Visit: Payer: Self-pay | Admitting: Internal Medicine

## 2019-08-09 DIAGNOSIS — R42 Dizziness and giddiness: Secondary | ICD-10-CM

## 2019-08-09 MED FILL — metFORMIN HCL 1000 MG TABS: 1000 | 30 days supply | Qty: 60 | Fill #5

## 2019-08-09 MED FILL — LISINOPRIL 2.5 MG TABLET: 2.5 | 30 days supply | Qty: 30 | Fill #9

## 2019-08-09 MED FILL — HYDROCHLOROTHIAZIDE 12.5 MG: 12.5 | 30 days supply | Qty: 30 | Fill #5

## 2019-08-09 MED FILL — ?OMEPRAZOLE 20MG CAP DR: 20 | 30 days supply | Qty: 30 | Fill #5

## 2019-08-09 MED FILL — FENOFIBRATE 145 MG TABLET: 145 | 30 days supply | Qty: 30 | Fill #5

## 2019-08-09 MED FILL — ?ROSUVASTATIN CALCIUM 20MG: 20 | 30 days supply | Qty: 30 | Fill #3

## 2019-08-16 ENCOUNTER — Encounter: Payer: Self-pay | Admitting: Family Medicine

## 2019-08-16 ENCOUNTER — Other Ambulatory Visit: Payer: Self-pay

## 2019-08-16 ENCOUNTER — Ambulatory Visit (INDEPENDENT_AMBULATORY_CARE_PROVIDER_SITE_OTHER): Payer: Self-pay | Admitting: Family Medicine

## 2019-08-16 ENCOUNTER — Other Ambulatory Visit: Payer: Self-pay | Admitting: Family Medicine

## 2019-08-16 VITALS — BP 126/73 | HR 79 | Temp 98.2°F | Resp 16 | Ht 69.0 in | Wt 223.0 lb

## 2019-08-16 DIAGNOSIS — J449 Chronic obstructive pulmonary disease, unspecified: Secondary | ICD-10-CM

## 2019-08-16 DIAGNOSIS — I10 Essential (primary) hypertension: Secondary | ICD-10-CM

## 2019-08-16 DIAGNOSIS — E1149 Type 2 diabetes mellitus with other diabetic neurological complication: Secondary | ICD-10-CM

## 2019-08-16 DIAGNOSIS — F17200 Nicotine dependence, unspecified, uncomplicated: Secondary | ICD-10-CM

## 2019-08-16 DIAGNOSIS — E781 Pure hyperglyceridemia: Secondary | ICD-10-CM

## 2019-08-16 LAB — POCT URINALYSIS DIPSTICK
Bilirubin, UA: NEGATIVE
Blood, UA: NEGATIVE
Glucose, UA: POSITIVE — AB
Leukocytes, UA: NEGATIVE
Nitrite, UA: NEGATIVE
Protein, UA: NEGATIVE
Spec Grav, UA: 1.025 (ref 1.010–1.025)
Urobilinogen, UA: 2 E.U./dL — AB
pH, UA: 7 (ref 5.0–8.0)

## 2019-08-16 LAB — POCT GLYCOSYLATED HEMOGLOBIN (HGB A1C): Hemoglobin A1C: 7.9 % — AB (ref 4.0–5.6)

## 2019-08-16 MED ORDER — LISINOPRIL 2.5 MG PO TABS: 2.5000 mg | ORAL_TABLET | Freq: Every day | ORAL | 11 refills | Status: DC

## 2019-08-16 NOTE — Patient Instructions (Addendum)
COVID-19 Vaccine Information can be found at: [email or web address] For questions related to vaccine distribution or appointments, please email [email or web address] or call [phone].    Diabetes Mellitus and Nutrition, Adult When you have diabetes (diabetes mellitus), it is very important to have healthy eating habits because your blood sugar (glucose) levels are greatly affected by what you eat and drink. Eating healthy foods in the appropriate amounts, at about the same times every day, can help you:  Control your blood glucose.  Lower your risk of heart disease.  Improve your blood pressure.  Reach or maintain a healthy weight. Every person with diabetes is different, and each person has different needs for a meal plan. Your health care provider may recommend that you work with a diet and nutrition specialist (dietitian) to make a meal plan that is best for you. Your meal plan may vary depending on factors such as:  The calories you need.  The medicines you take.  Your weight.  Your blood glucose, blood pressure, and cholesterol levels.  Your activity level.  Other health conditions you have, such as heart or kidney disease. How do carbohydrates affect me? Carbohydrates, also called carbs, affect your blood glucose level more than any other type of food. Eating carbs naturally raises the amount of glucose in your blood. Carb counting is a method for keeping track of how many carbs you eat. Counting carbs is important to keep your blood glucose at a healthy level, especially if you use insulin or take certain oral diabetes medicines. It is important to know how many carbs you can safely have in each meal. This is different for every person. Your dietitian can help you calculate how many carbs you should have at each meal and for each snack. Foods that contain carbs include:  Bread, cereal, rice, pasta, and  crackers.  Potatoes and corn.  Peas, beans, and lentils.  Milk and yogurt.  Fruit and juice.  Desserts, such as cakes, cookies, ice cream, and candy. How does alcohol affect me? Alcohol can cause a sudden decrease in blood glucose (hypoglycemia), especially if you use insulin or take certain oral diabetes medicines. Hypoglycemia can be a life-threatening condition. Symptoms of hypoglycemia (sleepiness, dizziness, and confusion) are similar to symptoms of having too much alcohol. If your health care provider says that alcohol is safe for you, follow these guidelines:  Limit alcohol intake to no more than 1 drink per day for nonpregnant women and 2 drinks per day for men. One drink equals 12 oz of beer, 5 oz of wine, or 1 oz of hard liquor.  Do not drink on an empty stomach.  Keep yourself hydrated with water, diet soda, or unsweetened iced tea.  Keep in mind that regular soda, juice, and other mixers may contain a lot of sugar and must be counted as carbs. What are tips for following this plan?  Reading food labels  Start by checking the serving size on the "Nutrition Facts" label of packaged foods and drinks. The amount of calories, carbs, fats, and other nutrients listed on the label is based on one serving of the item. Many items contain more than one serving per package.  Check the total grams (g) of carbs in one serving. You can calculate the number of servings of carbs in one serving by dividing the total carbs by 15. For example, if a food has 30 g of total carbs, it would be equal to 2 servings of carbs.  Check the number of grams (  g) of saturated and trans fats in one serving. Choose foods that have low or no amount of these fats.  Check the number of milligrams (mg) of salt (sodium) in one serving. Most people should limit total sodium intake to less than 2,300 mg per day.  Always check the nutrition information of foods labeled as "low-fat" or "nonfat". These foods may be  higher in added sugar or refined carbs and should be avoided.  Talk to your dietitian to identify your daily goals for nutrients listed on the label. Shopping  Avoid buying canned, premade, or processed foods. These foods tend to be high in fat, sodium, and added sugar.  Shop around the outside edge of the grocery store. This includes fresh fruits and vegetables, bulk grains, fresh meats, and fresh dairy. Cooking  Use low-heat cooking methods, such as baking, instead of high-heat cooking methods like deep frying.  Cook using healthy oils, such as olive, canola, or sunflower oil.  Avoid cooking with butter, cream, or high-fat meats. Meal planning  Eat meals and snacks regularly, preferably at the same times every day. Avoid going long periods of time without eating.  Eat foods high in fiber, such as fresh fruits, vegetables, beans, and whole grains. Talk to your dietitian about how many servings of carbs you can eat at each meal.  Eat 4-6 ounces (oz) of lean protein each day, such as lean meat, chicken, fish, eggs, or tofu. One oz of lean protein is equal to: ? 1 oz of meat, chicken, or fish. ? 1 egg. ?  cup of tofu.  Eat some foods each day that contain healthy fats, such as avocado, nuts, seeds, and fish. Lifestyle  Check your blood glucose regularly.  Exercise regularly as told by your health care provider. This may include: ? 150 minutes of moderate-intensity or vigorous-intensity exercise each week. This could be brisk walking, biking, or water aerobics. ? Stretching and doing strength exercises, such as yoga or weightlifting, at least 2 times a week.  Take medicines as told by your health care provider.  Do not use any products that contain nicotine or tobacco, such as cigarettes and e-cigarettes. If you need help quitting, ask your health care provider.  Work with a Veterinary surgeon or diabetes educator to identify strategies to manage stress and any emotional and social  challenges. Questions to ask a health care provider  Do I need to meet with a diabetes educator?  Do I need to meet with a dietitian?  What number can I call if I have questions?  When are the best times to check my blood glucose? Where to find more information:  American Diabetes Association: diabetes.org  Academy of Nutrition and Dietetics: www.eatright.[employer] of Diabetes and Digestive and Kidney Diseases (NIH): CarFlippers.tn Summary  A healthy meal plan will help you control your blood glucose and maintain a healthy lifestyle.  Working with a diet and nutrition specialist (dietitian) can help you make a meal plan that is best for you.  Keep in mind that carbohydrates (carbs) and alcohol have immediate effects on your blood glucose levels. It is important to count carbs and to use alcohol carefully. This information is not intended to replace advice given to you by your health care provider. Make sure you discuss any questions you have with your health care provider. Document Revised: 06/19/2017 Document Reviewed: 08/11/2016 Elsevier Patient Education  2020 [employer].

## 2019-08-16 NOTE — Progress Notes (Signed)
[facility]   Established Patient Office Visit  Subjective:  Patient ID: Julian Atkinson, male    DOB: 08-13-1965  Age: 54 y.o. MRN: [id number]  CC:  Chief Complaint  Patient presents with  . Hypertension  . Diabetes   Julian Atkinson is a 54 year old male with a medical history significant for essential hypertension, type 2 DM, hyperlipidemia, COPD and tobacco dependence presents for a follow up of chronic conditions. He says that he has been doing well and is without complaint. He has not been following a carbohydrate diet and has been drinking sweet tea from [employer] on most days.  Hypertension This is a chronic problem. The problem is controlled. Pertinent negatives include no blurred vision, chest pain, orthopnea or shortness of breath. There are no associated agents to hypertension. There are no known risk factors for coronary artery disease. The current treatment provides no improvement. There are no compliance problems.  There is no history of chronic renal disease, coarctation of the aorta, pheochromocytoma, sleep apnea or a thyroid problem.  Diabetes Pertinent negatives for diabetes include no blurred vision, no chest pain, no fatigue, no polydipsia, no polyphagia and no polyuria.   BP 126/73 (BP Location: Left Arm, Patient Position: Sitting, Cuff Size: Large)   Pulse 79   Temp 98.2 F (36.8 C) (Oral)   Resp 16   Ht 5\' 9"  (1.753 m)   Wt 223 lb (101.2 kg)   SpO2 95%   BMI 32.93 kg/m  Past Medical History:  Diagnosis Date  . GERD (gastroesophageal reflux disease)   . Hypertension   . Migraines   . Renal disorder     Social History   Socioeconomic History  . Marital status: Divorced    Spouse name: Not on file  . Number of children: Not on file  . Years of education: Not on file  . Highest education level: Not on file  Occupational History  . Not on file  Tobacco Use  . Smoking status: Current Every Day Smoker    Packs/day: 1.00    Years: 30.00    Pack years: 30.00    Types: Cigarettes  . Smokeless tobacco: Never Used  Substance and Sexual Activity  . Alcohol use: No  . Drug use: Yes    Types: Marijuana    Comment: one or twice a week  . Sexual activity: Not on file  Other Topics Concern  . Not on file  Social History Narrative  . Not on file   Social Determinants of Health   Financial Resource Strain:   . Difficulty of Paying Living Expenses: Not on file  Food Insecurity:   . Worried About in the Last Year: Not on file  . Ran Out of Food in the Last Year: Not on file  Transportation Needs:   . Lack of Transportation (Medical): Not on file  . Lack of Transportation (Non-Medical): Not on file  Physical Activity:   . Days of Exercise per Week: Not on file  . Minutes of Exercise per Session: Not on file  Stress:   . Feeling of Stress : Not on file  Social Connections:   . Frequency of Communication with Friends and Family: Not on file  . Frequency of Social Gatherings with Friends and Family: Not on file  . Attends Religious Services: Not on file  . Active Member of Clubs or Organizations: Not on file  . Attends Club or  Organization Meetings: Not on file  . Marital Status: Not on file  Intimate Partner Violence:   . Fear of Current or Ex-Partner: Not on file  . Emotionally Abused: Not on file  . Physically Abused: Not on file  . Sexually Abused: Not on file    Outpatient Medications Prior to Visit  Medication Sig Dispense Refill  . albuterol (VENTOLIN HFA) 108 (90 Base) MCG/ACT inhaler Inhale 2 puffs into the lungs every 6 (six) hours as needed for wheezing or shortness of breath. 6.7 g 5  . aspirin EC 81 MG tablet Take 1 tablet (81 mg total) by mouth daily. 90 tablet 3  . fenofibrate (TRICOR) 145 MG tablet Take 1 tablet (145 mg total) by mouth daily. 30 tablet 11  . hydrochlorothiazide (HYDRODIURIL) 12.5 MG tablet Take 1 tablet (12.5 mg total) by mouth  daily. 30 tablet 5  . meclizine (ANTIVERT) 25 MG tablet TAKE 1 TABLET BY MOUTH 3 TIMES DAILY AS NEEDED FOR DIZZINESS. 60 tablet 1  . metFORMIN (GLUCOPHAGE) 1000 MG tablet TAKE 1 TABLET BY MOUTH 2 TIMES DAILY WITH A MEAL. 180 tablet 2  . omeprazole (PRILOSEC) 20 MG capsule Take 1 capsule (20 mg total) by mouth daily. 30 capsule 5  . rosuvastatin (CRESTOR) 20 MG tablet Take 1 tablet (20 mg total) by mouth daily. 90 tablet 3  . lisinopril (ZESTRIL) 2.5 MG tablet Take 1 tablet (2.5 mg total) by mouth daily. (Patient not taking: Reported on 03/04/2019) 30 tablet 11   No facility-administered medications prior to visit.    Allergies  Allergen Reactions  . Codeine Nausea And Vomiting  . Penicillins Other (See Comments)    Childhood reaction    ROS Review of Systems  Constitutional: Negative.  Negative for fatigue and fever.  HENT: Negative.   Eyes: Negative for blurred vision.  Respiratory: Negative.  Negative for shortness of breath.   Cardiovascular: Negative.  Negative for chest pain and orthopnea.  Gastrointestinal: Negative.   Endocrine: Negative for polydipsia, polyphagia and polyuria.  Genitourinary: Negative.   Musculoskeletal: Negative.   Neurological: Negative.   Hematological: Negative.   Psychiatric/Behavioral: Negative.       Objective:    Physical Exam  Constitutional: He is oriented to person, place, and time. He appears well-developed and well-nourished.  HENT:  Head: Normocephalic and atraumatic.  Eyes: Pupils are equal, round, and reactive to light.  Pulmonary/Chest: Effort normal and breath sounds normal.  Abdominal: Soft. Bowel sounds are normal.  Musculoskeletal:        General: Normal range of motion.     Cervical back: Normal range of motion.  Neurological: He is alert and oriented to person, place, and time.  Skin: Skin is warm and dry.  Psychiatric: He has a normal mood and affect. His behavior is normal. Judgment and thought content normal.    BP  126/73 (BP Location: Left Arm, Patient Position: Sitting, Cuff Size: Large)   Pulse 79   Temp 98.2 F (36.8 C) (Oral)   Resp 16   Ht 5\' 9"  (1.753 m)   Wt 223 lb (101.2 kg)   SpO2 95%   BMI 32.93 kg/m  Wt Readings from Last 3 Encounters:  08/16/19 223 lb (101.2 kg)  03/04/19 222 lb (100.7 kg)  09/03/18 221 lb (100.2 kg)     Health Maintenance Due  Topic Date Due  . OPHTHALMOLOGY EXAM  12/24/2018    There are no preventive care reminders to display for this patient.  No results found  for: TSH Lab Results  Component Value Date   WBC 8.7 03/03/2018   HGB 16.4 03/03/2018   HCT 47.7 03/03/2018   MCV 90 03/03/2018   PLT 198 04/30/2017   Lab Results  Component Value Date   NA 138 09/03/2018   K 4.3 09/03/2018   CO2 23 09/03/2018   GLUCOSE 116 (H) 09/03/2018   BUN 13 09/03/2018   CREATININE 0.83 09/03/2018   BILITOT 0.3 09/03/2018   ALKPHOS 48 09/03/2018   AST 12 09/03/2018   ALT 18 09/03/2018   PROT 6.7 09/03/2018   ALBUMIN 4.4 09/03/2018   CALCIUM 9.6 09/03/2018   ANIONGAP 9 10/26/2016   Lab Results  Component Value Date   CHOL 141 03/04/2019   Lab Results  Component Value Date   HDL 27 (L) 03/04/2019   Lab Results  Component Value Date   LDLCALC 71 03/04/2019   Lab Results  Component Value Date   TRIG 216 (H) 03/04/2019   Lab Results  Component Value Date   CHOLHDL 5.2 (H) 03/04/2019   Lab Results  Component Value Date   HGBA1C 7.7 (A) 03/04/2019      Assessment & Plan:   Problem List Items Addressed This Visit      Cardiovascular and Mediastinum   High blood pressure   Relevant Orders   Urinalysis Dipstick   CMP and Liver     Endocrine   Diabetes mellitus ([place])   Relevant Orders   HgB A1c   Urinalysis Dipstick   CMP and Liver    Other Visit Diagnoses    Hypertriglyceridemia    -  Primary   Relevant Orders   Lipid Panel     Hypertriglyceridemia  - Lipid Panel  Type 2 diabetes mellitus with other neurologic complication,  without long-term current use of insulin (HCC) Hemoglobin a1c is not at goal. Patient says that he has been drinking sweet tea and has not been following a carb modified diet. Discussed diet at length.  - HgB A1c - Urinalysis Dipstick - CMP and Liver  Essential hypertension Continue medication, monitor blood pressure at home. Continue DASH diet. Reminder to go to the ER if any CP, SOB, nausea, dizziness, severe HA, changes vision/speech, left arm numbness and tingling and jaw pain.    - Urinalysis Dipstick - CMP and Liver - lisinopril (ZESTRIL) 2.5 MG tablet; Take 1 tablet (2.5 mg total) by mouth daily.  Dispense: 30 tablet; Refill: 11  Chronic obstructive pulmonary disease, unspecified COPD type ([place]) Stable and controlled on current medications regimen. No changes warranted at this time.  Tobacco dependence Smoking cessation instruction/counseling given:  counseled patient on the dangers of tobacco use, advised patient to stop smoking, and reviewed strategies to maximize success   Follow-up: Return in about 6 months (around 02/13/2020).     Donia Pounds  APRN, MSN, FNP-C [facility] [address] [place], [place] [address] [phone]

## 2019-08-17 LAB — CMP AND LIVER
ALT: 18 IU/L (ref 0–44)
AST: 13 IU/L (ref 0–40)
Albumin: 4.2 g/dL (ref 3.8–4.9)
Alkaline Phosphatase: 66 IU/L (ref 39–117)
BUN: 13 mg/dL (ref 6–24)
Bilirubin Total: 0.4 mg/dL (ref 0.0–1.2)
Bilirubin, Direct: 0.12 mg/dL (ref 0.00–0.40)
CO2: 24 mmol/L (ref 20–29)
Calcium: 9.4 mg/dL (ref 8.7–10.2)
Chloride: 100 mmol/L (ref 96–106)
Creatinine, Ser: 0.91 mg/dL (ref 0.76–1.27)
GFR calc Af Amer: 110 mL/min/{1.73_m2} (ref >59)
GFR calc non Af Amer: 95 mL/min/{1.73_m2} (ref >59)
Glucose: 216 mg/dL — ABNORMAL HIGH (ref 65–99)
Potassium: 4.1 mmol/L (ref 3.5–5.2)
Sodium: 139 mmol/L (ref 134–144)
Total Protein: 6.6 g/dL (ref 6.0–8.5)

## 2019-08-17 LAB — LIPID PANEL
Chol/HDL Ratio: 4.9 ratio (ref 0.0–5.0)
Cholesterol, Total: 123 mg/dL (ref 100–199)
HDL: 25 mg/dL — ABNORMAL LOW (ref >39)
LDL Chol Calc (NIH): 48 mg/dL (ref 0–99)
Triglycerides: 329 mg/dL — ABNORMAL HIGH (ref 0–149)
VLDL Cholesterol Cal: 50 mg/dL — ABNORMAL HIGH (ref 5–40)

## 2019-08-23 ENCOUNTER — Telehealth: Payer: Self-pay

## 2019-08-23 NOTE — Telephone Encounter (Signed)
Called, no answer. Left a message to call back. Thanks!  

## 2019-08-23 NOTE — Telephone Encounter (Signed)
Patient returned call and I advised that cholesterol continues to be elevated. Recommended he work on diet and exercise and take a OTC fish oil capsule prior to dinner daily. Advised that we will repeat cholesterol panel in 6 months. Thanks!

## 2019-08-23 NOTE — Telephone Encounter (Signed)
-----   Message from Massie Maroon, [place] sent at 08/22/2019  8:11 PM EST ----- Regarding: lab results Please inform patient that cholesterol continues to be elevated. Discussed diet modifications at length during appointment. Also recommend OTC fish oil capsule prior to dinner. Will repeat cholesterol panel in 6 months. Recommend increasing daily physical activity as well.  Follow up as scheduled.  [name]  APRN, MSN, FNP-C [facility] [address] [place], [place] [address] [phone]

## 2019-08-25 NOTE — Telephone Encounter (Signed)
I am no longer this patient's PCP. Please send to Crystal King, NP. Thanks

## 2019-09-06 ENCOUNTER — Other Ambulatory Visit: Payer: Self-pay | Admitting: Family Medicine

## 2019-09-06 ENCOUNTER — Telehealth: Payer: Self-pay | Admitting: Family Medicine

## 2019-09-06 ENCOUNTER — Other Ambulatory Visit: Payer: Self-pay

## 2019-09-06 DIAGNOSIS — I10 Essential (primary) hypertension: Secondary | ICD-10-CM

## 2019-09-06 DIAGNOSIS — R42 Dizziness and giddiness: Secondary | ICD-10-CM

## 2019-09-06 DIAGNOSIS — J449 Chronic obstructive pulmonary disease, unspecified: Secondary | ICD-10-CM

## 2019-09-06 DIAGNOSIS — E1149 Type 2 diabetes mellitus with other diabetic neurological complication: Secondary | ICD-10-CM

## 2019-09-06 DIAGNOSIS — E781 Pure hyperglyceridemia: Secondary | ICD-10-CM

## 2019-09-06 DIAGNOSIS — K219 Gastro-esophageal reflux disease without esophagitis: Secondary | ICD-10-CM

## 2019-09-06 MED ORDER — LISINOPRIL 2.5 MG PO TABS: 2.5000 mg | ORAL_TABLET | Freq: Every day | ORAL | 11 refills | Status: DC

## 2019-09-06 MED ORDER — METFORMIN HCL 1000 MG PO TABS: ORAL_TABLET | ORAL | 2 refills | Status: DC

## 2019-09-06 MED ORDER — ALBUTEROL SULFATE HFA 108 (90 BASE) MCG/ACT IN AERS: 2.0000 | INHALATION_SPRAY | Freq: Four times a day (QID) | RESPIRATORY_TRACT | 5 refills | Status: DC | PRN

## 2019-09-06 MED ORDER — HYDROCHLOROTHIAZIDE 12.5 MG PO CAPS: 12.5000 mg | ORAL_CAPSULE | Freq: Every day | ORAL | 5 refills | Status: DC

## 2019-09-06 MED ORDER — OMEPRAZOLE 20 MG PO CPDR: 20.0000 mg | DELAYED_RELEASE_CAPSULE | Freq: Every day | ORAL | 5 refills | Status: DC

## 2019-09-06 MED ORDER — ROSUVASTATIN CALCIUM 20 MG PO TABS: 20.0000 mg | ORAL_TABLET | Freq: Every day | ORAL | 3 refills | Status: DC

## 2019-09-06 MED ORDER — MECLIZINE HCL 25 MG PO TABS: ORAL_TABLET | ORAL | 3 refills | Status: DC

## 2019-09-06 MED ORDER — ASPIRIN EC 81 MG PO TBEC: 81.0000 mg | DELAYED_RELEASE_TABLET | Freq: Every day | ORAL | 3 refills | Status: AC

## 2019-09-06 MED ORDER — FENOFIBRATE 145 MG PO TABS: 145.0000 mg | ORAL_TABLET | Freq: Every day | ORAL | 11 refills | Status: DC

## 2019-09-06 MED FILL — ?ROSUVASTATIN CALCIUM 20MG: 20 | 30 days supply | Qty: 30 | Fill #4

## 2019-09-06 MED FILL — ?FENOFIBRATE 145 MG TABLET: 145 | 30 days supply | Qty: 30 | Fill #6

## 2019-09-06 MED FILL — HYDROCHLOROTHIAZIDE 12.5 MG: 12.5 | 30 days supply | Qty: 30 | Fill #0

## 2019-09-06 MED FILL — ?OMEPRAZOLE 20MG CAP DR: 20 | 30 days supply | Qty: 30 | Fill #0

## 2019-09-06 MED FILL — ?MECLIZINE 25MG TAB: 25 | 30 days supply | Qty: 60 | Fill #0

## 2019-09-06 MED FILL — LISINOPRIL 2.5 MG TABLET: 2.5 | 30 days supply | Qty: 30 | Fill #0

## 2019-09-06 MED FILL — $VENTOLIN HFA 18G INHALER: 108 (90 BAS | 25 days supply | Qty: 18 | Fill #0

## 2019-09-06 MED FILL — metFORMIN HCL 1000 MG TABS: 1000 | 30 days supply | Qty: 60 | Fill #6

## 2019-09-06 NOTE — Telephone Encounter (Signed)
Called, no answer on either phone line, Left a message on both to call back. Thanks!

## 2019-09-16 IMAGING — CT CT HEAD WO/W CM
3 of 4 series · 14 of 47 positions shown, 16 images · IV contrast (agent unspecified)
Comparison: Head CT without contrast 10/05/2012.

CLINICAL DATA: 51-year-old male with dizziness symptoms for several
years. Vertigo. Chronic deafness in the left ear related to
childhood injury.

EXAM:
CT HEAD WITHOUT AND WITH CONTRAST
TECHNIQUE: Contiguous axial images were obtained from the base of the skull
through the vertex without and with intravenous contrast
CONTRAST:  75 mL [id number]

[Series 2: head wo · axial · 0.47mm/px · z∈[+1496,+1626]mm · 8 of 32 slices shown, 10 images]
[im 3/32  brain]
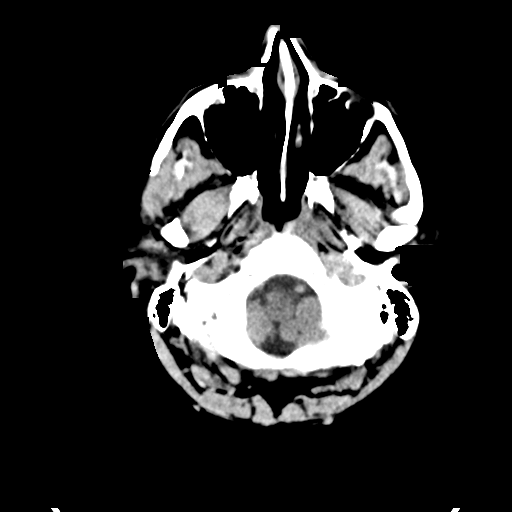
[im 3/32  bone]
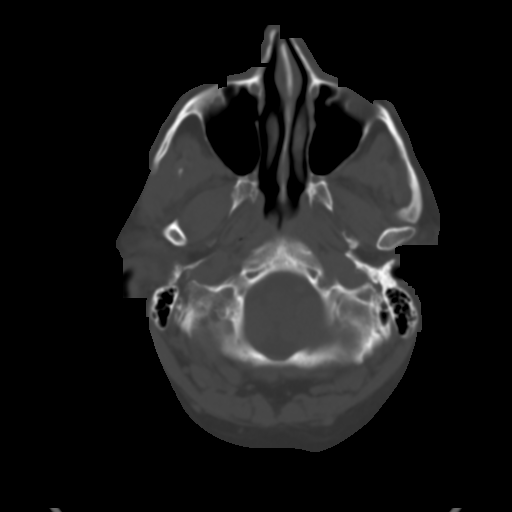
[im 7/32  brain]
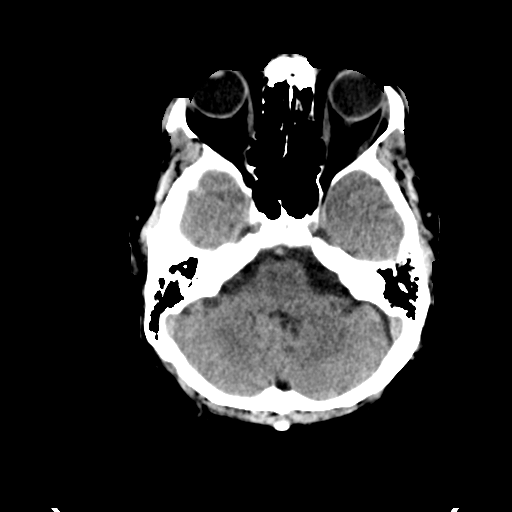
[im 12/32  brain]
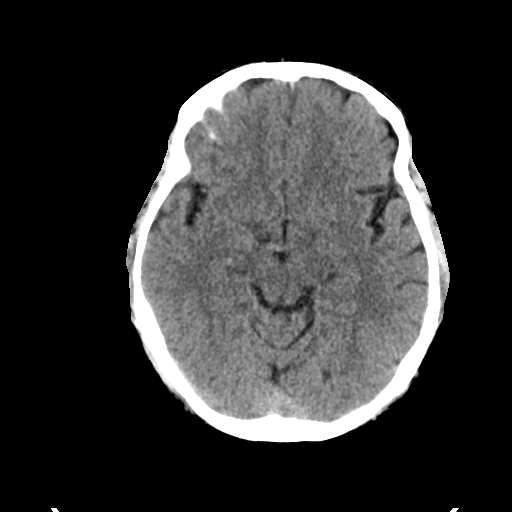
[im 14/32  brain]
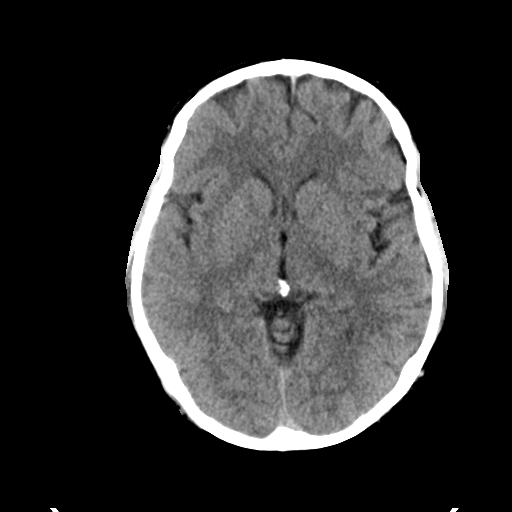
[im 18/32  brain]
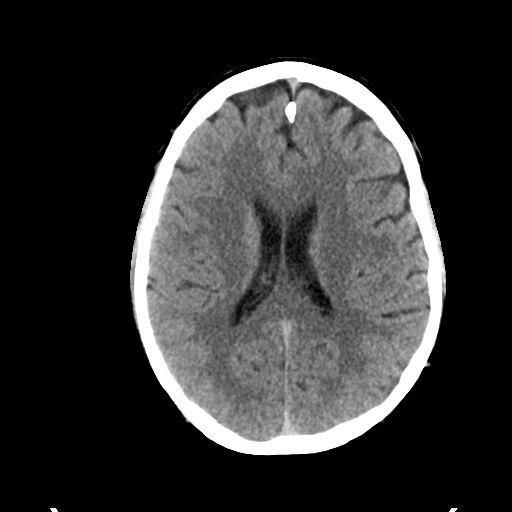
[im 18/32  bone]
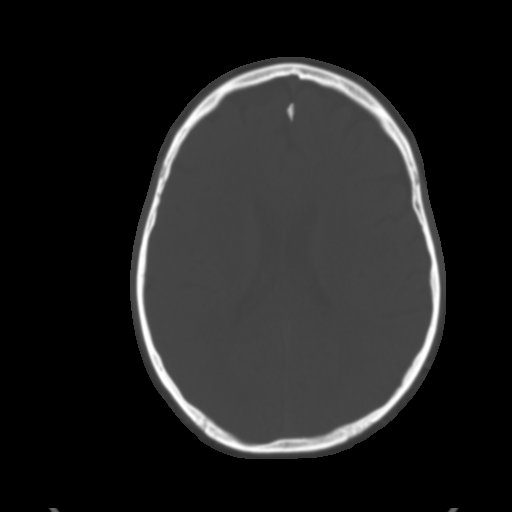
[im 20/32  brain]
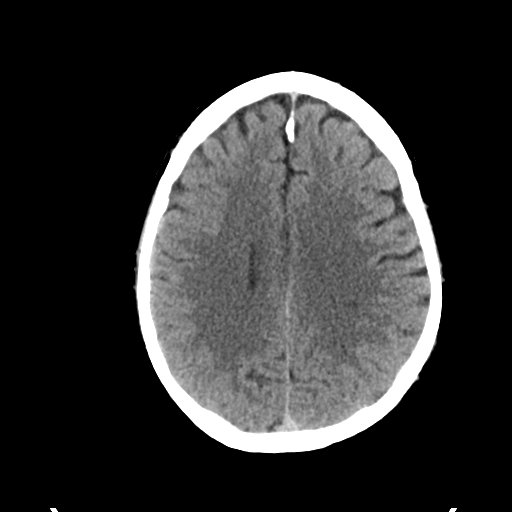
[im 25/32  brain]
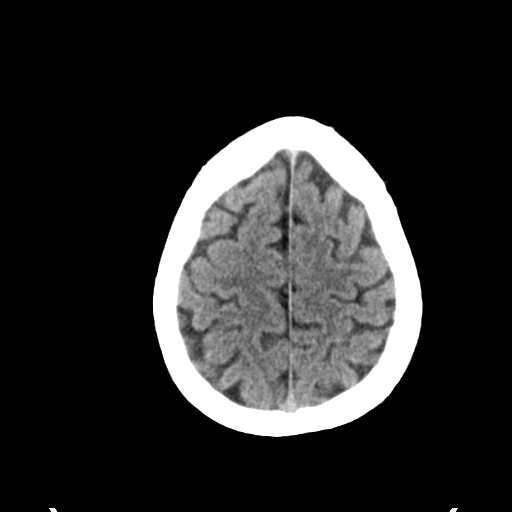
[im 29/32  brain]
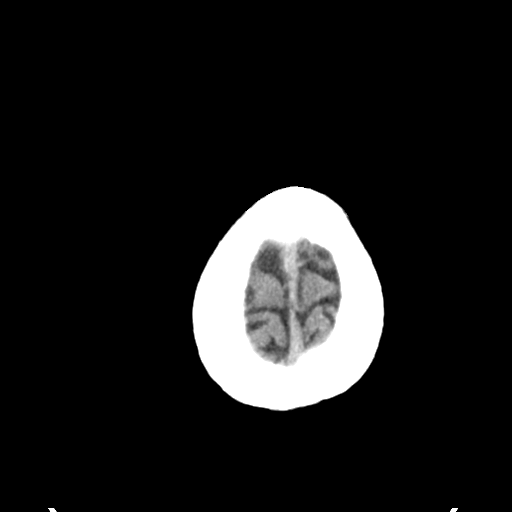

[Series 5: coronal soft tissue · coronal · 0.33mm/px · 3 of 68 slices shown]
[im 23/68  brain]
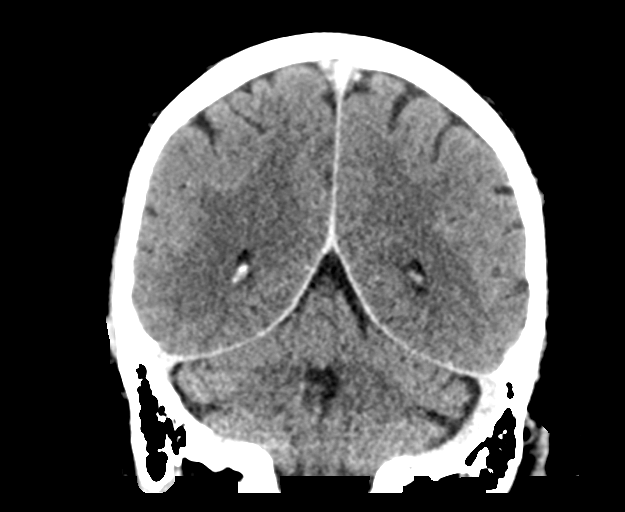
[im 30/68  brain]
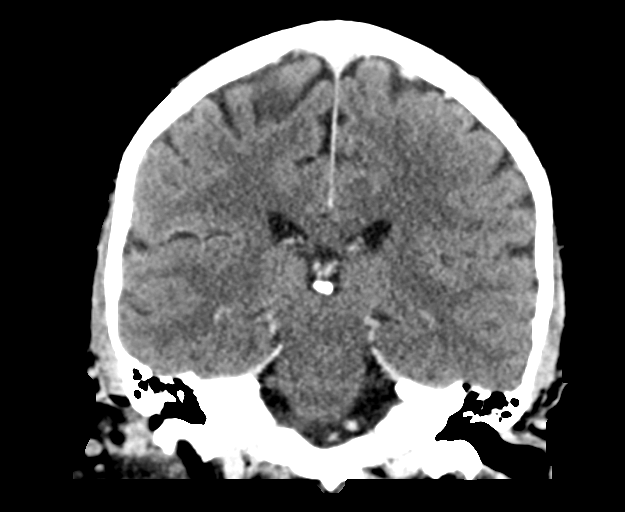
[im 38/68  brain]
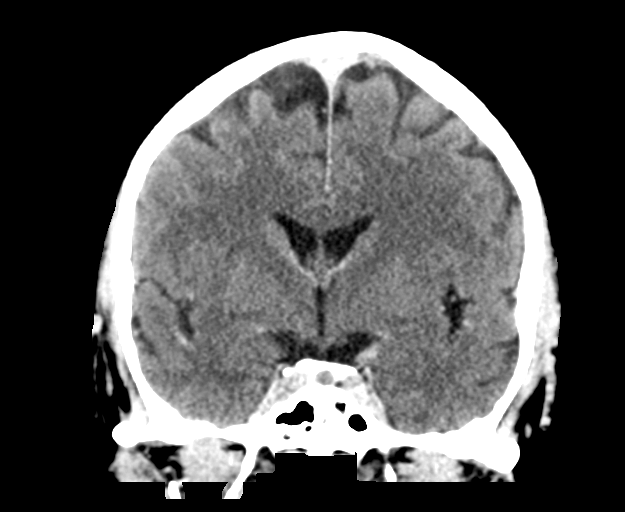

[Series 6: sagittal soft tissue · sagittal · 0.31mm/px · 3 of 57 slices shown]
[im 19/57  brain]
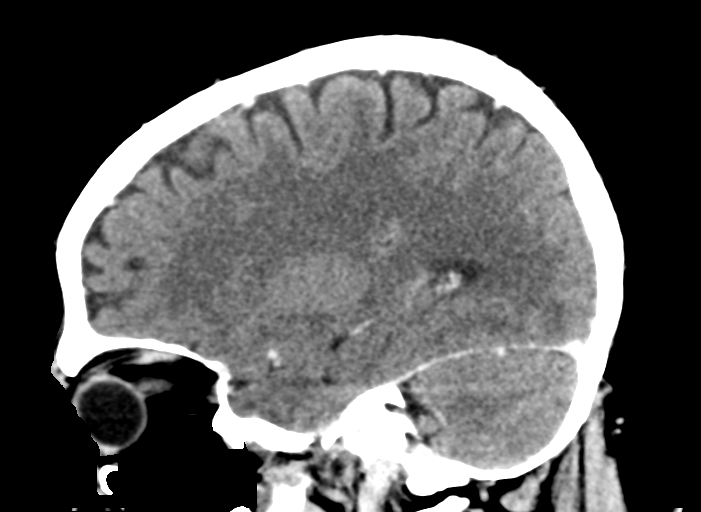
[im 29/57  brain]
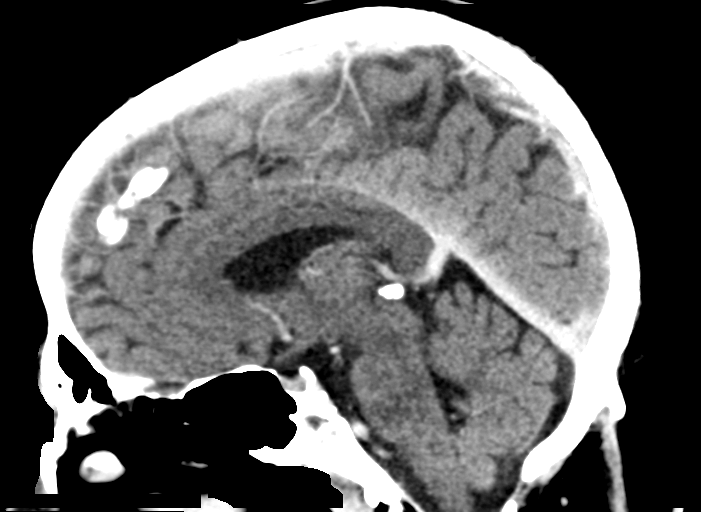
[im 38/57  brain]
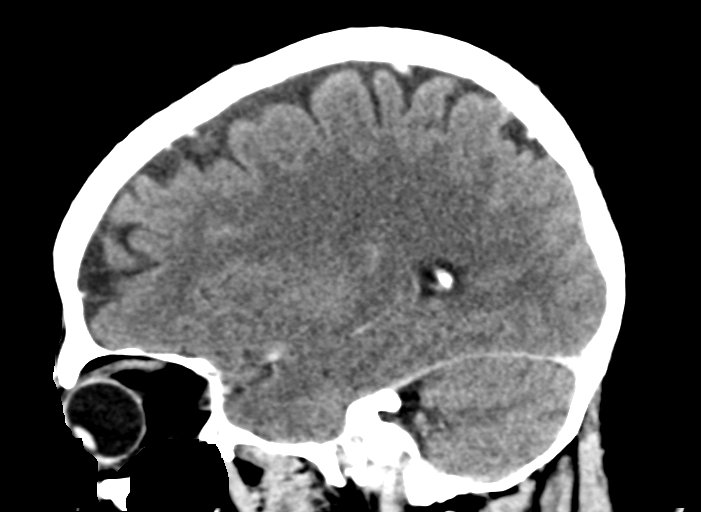

[14 of 47 positions shown; findings below may reference images not displayed]

FINDINGS: Brain: Cerebral volume is stable and normal. No midline shift,
ventriculomegaly, mass effect, evidence of mass lesion, intracranial
hemorrhage or evidence of cortically based acute infarction.
Gray-white matter differentiation is within normal limits throughout
the brain. No encephalomalacia identified. No abnormal enhancement
identified.

Vascular: Mild Calcified atherosclerosis at the skull base. Major
intracranial vascular structures appear to be normally enhancing.

Skull: Stable and negative.

Sinuses/Orbits: Visualized paranasal sinuses and mastoids are stable
and well pneumatized.

The bilateral tympanic cavities and mastoid air cells remain clear.
The left temporal bone structures are grossly normal.

Other: Visualized orbits and scalp soft tissues are within normal
limits.
IMPRESSION: Stable since 2437 and normal CT appearance of the brain.

## 2019-10-06 MED FILL — ?OMEPRAZOLE 20 MG CAP DR: 20 | 30 days supply | Qty: 30 | Fill #1

## 2019-10-06 MED FILL — ?ROSUVASTATIN CALCIUM 20MG: 20 | 30 days supply | Qty: 30 | Fill #5

## 2019-10-06 MED FILL — ?MECLIZINE 25MG TAB: 25 | 30 days supply | Qty: 60 | Fill #1

## 2019-10-06 MED FILL — metFORMIN HCL 1000 MG TABS: 1000 | 30 days supply | Qty: 60 | Fill #7

## 2019-10-06 MED FILL — LISINOPRIL 2.5 MG TABLET: 2.5 | 30 days supply | Qty: 30 | Fill #1

## 2019-10-06 MED FILL — HYDROCHLOROTHIAZIDE 12.5 MG: 12.5 | 30 days supply | Qty: 30 | Fill #1

## 2019-10-06 MED FILL — ?FENOFIBRATE 145 MG TABLET: 145 | 30 days supply | Qty: 30 | Fill #7

## 2019-10-06 MED FILL — $VENTOLIN HFA 18G INHALER: 108 (90 BAS | 25 days supply | Qty: 18 | Fill #1

## 2019-11-07 MED FILL — ?OMEPRAZOLE 20 MG CAP DR: 20 | 30 days supply | Qty: 30 | Fill #2

## 2019-11-07 MED FILL — MECLIZINE 25 MG TABLET: 25 | 30 days supply | Qty: 60 | Fill #2

## 2019-11-07 MED FILL — LISINOPRIL 2.5 MG TABLET: 2.5 | 30 days supply | Qty: 30 | Fill #2

## 2019-11-07 MED FILL — metFORMIN HCL 1000 MG TABS: 1000 | 30 days supply | Qty: 60 | Fill #8

## 2019-11-07 MED FILL — HYDROCHLOROTHIAZIDE 12.5 MG: 12.5 | 30 days supply | Qty: 30 | Fill #2

## 2019-11-07 MED FILL — $VENTOLIN HFA 18G INHALER: 108 (90 BAS | 25 days supply | Qty: 18 | Fill #2

## 2019-11-07 MED FILL — ?ROSUVASTATIN CALC 20MG TA: 20 | 30 days supply | Qty: 30 | Fill #6

## 2019-11-07 MED FILL — ?FENOFIBRATE 145 MG TABLET: 145 | 30 days supply | Qty: 30 | Fill #8

## 2019-11-15 ENCOUNTER — Ambulatory Visit: Payer: Self-pay | Admitting: Family Medicine

## 2019-11-15 ENCOUNTER — Telehealth: Payer: Self-pay | Admitting: Family Medicine

## 2019-11-15 NOTE — Telephone Encounter (Signed)
Pt had to reschedule however wife wants to know if this will effect him getting his medicine. Pt does have another appointment scheduled already. Please call pt or wife back

## 2019-12-05 MED FILL — ?FENOFIBRATE 145 MG TABLET: 145 | 30 days supply | Qty: 30 | Fill #9

## 2019-12-05 MED FILL — HYDROCHLOROTHIAZIDE 12.5 MG: 12.5 | 30 days supply | Qty: 30 | Fill #3

## 2019-12-05 MED FILL — LISINOPRIL 2.5 MG TABLET: 2.5 | 30 days supply | Qty: 30 | Fill #3

## 2019-12-05 MED FILL — metFORMIN HCL 1000 MG TABS: 1000 | 30 days supply | Qty: 60 | Fill #0

## 2019-12-05 MED FILL — ROSUVASTATIN CALCIUM 20 MG: 20 | 30 days supply | Qty: 30 | Fill #7

## 2019-12-05 MED FILL — ?MECLIZINE 25MG TAB: 25 | 30 days supply | Qty: 60 | Fill #3

## 2019-12-05 MED FILL — ?OMEPRAZOLE 20 MG CAP DR: 20 | 30 days supply | Qty: 30 | Fill #3

## 2019-12-05 MED FILL — !VENTOLIN HFA INHALER: 108 (90 BAS | 25 days supply | Qty: 18 | Fill #3

## 2019-12-06 ENCOUNTER — Ambulatory Visit (HOSPITAL_COMMUNITY)
Admission: EM | Admit: 2019-12-06 | Discharge: 2019-12-06 | Disposition: A | Discharge status: Home / Self Care | Payer: HRSA Program | Attending: Physician Assistant | Admitting: Physician Assistant

## 2019-12-06 ENCOUNTER — Ambulatory Visit (INDEPENDENT_AMBULATORY_CARE_PROVIDER_SITE_OTHER): Payer: HRSA Program

## 2019-12-06 ENCOUNTER — Other Ambulatory Visit: Payer: Self-pay

## 2019-12-06 ENCOUNTER — Encounter (HOSPITAL_COMMUNITY): Payer: Self-pay

## 2019-12-06 DIAGNOSIS — K219 Gastro-esophageal reflux disease without esophagitis: Secondary | ICD-10-CM | POA: Diagnosis not present

## 2019-12-06 DIAGNOSIS — Z20822 Contact with and (suspected) exposure to covid-19: Secondary | ICD-10-CM | POA: Diagnosis not present

## 2019-12-06 DIAGNOSIS — F17210 Nicotine dependence, cigarettes, uncomplicated: Secondary | ICD-10-CM | POA: Diagnosis not present

## 2019-12-06 DIAGNOSIS — Z7982 Long term (current) use of aspirin: Secondary | ICD-10-CM | POA: Insufficient documentation

## 2019-12-06 DIAGNOSIS — E119 Type 2 diabetes mellitus without complications: Secondary | ICD-10-CM | POA: Diagnosis not present

## 2019-12-06 DIAGNOSIS — J449 Chronic obstructive pulmonary disease, unspecified: Secondary | ICD-10-CM | POA: Diagnosis not present

## 2019-12-06 DIAGNOSIS — Z885 Allergy status to narcotic agent status: Secondary | ICD-10-CM | POA: Insufficient documentation

## 2019-12-06 DIAGNOSIS — J019 Acute sinusitis, unspecified: Secondary | ICD-10-CM | POA: Diagnosis not present

## 2019-12-06 DIAGNOSIS — R05 Cough: Secondary | ICD-10-CM | POA: Diagnosis not present

## 2019-12-06 DIAGNOSIS — I10 Essential (primary) hypertension: Secondary | ICD-10-CM | POA: Insufficient documentation

## 2019-12-06 DIAGNOSIS — Z880 Allergy status to penicillin: Secondary | ICD-10-CM | POA: Diagnosis not present

## 2019-12-06 DIAGNOSIS — Z7984 Long term (current) use of oral hypoglycemic drugs: Secondary | ICD-10-CM | POA: Insufficient documentation

## 2019-12-06 DIAGNOSIS — R0789 Other chest pain: Secondary | ICD-10-CM | POA: Diagnosis not present

## 2019-12-06 DIAGNOSIS — R059 Cough, unspecified: Secondary | ICD-10-CM

## 2019-12-06 DIAGNOSIS — Z79899 Other long term (current) drug therapy: Secondary | ICD-10-CM | POA: Diagnosis not present

## 2019-12-06 HISTORY — DX: Type 2 diabetes mellitus without complications: E11.9

## 2019-12-06 MED ORDER — SALINE SPRAY 0.65 % NA SOLN
1.0000 | NASAL | 0 refills | Status: DC | PRN
Start: 2019-12-06 — End: 2022-12-30

## 2019-12-06 MED ORDER — BENZONATATE 100 MG PO CAPS
100.0000 mg | ORAL_CAPSULE | Freq: Three times a day (TID) | ORAL | 0 refills | Status: DC
Start: 2019-12-06 — End: 2021-05-21

## 2019-12-06 MED ORDER — DOXYCYCLINE HYCLATE 100 MG PO CAPS
100.0000 mg | ORAL_CAPSULE | Freq: Two times a day (BID) | ORAL | 0 refills | Status: DC
Start: 2019-12-06 — End: 2021-05-21

## 2019-12-06 NOTE — ED Triage Notes (Signed)
t c/o productive cough w/yellowish/clear phelghmx2 wks. Pt c/o chest congestion. Pt states it started in his sinuses 2 wks ago, but went into his chest. Pt has non labored breathing. Skin color WNL. RUl, LUL of lungs are diminished.

## 2019-12-06 NOTE — Discharge Instructions (Addendum)
Take the doxycycline 2 times a day for 10 days Take the tessalon for cough Use the saline nasal spray daily as much as you like  If worsening symptoms please return, follow up with your primary care in 7-10 days for re-evaluation.  If your Covid-19 test is positive, you will receive a phone call from [facility] regarding your results. Negative test results are not called. Both positive and negative results area always visible on MyChart. If you do not have a MyChart account, sign up instructions are in your discharge papers.   Persons who are directed to care for themselves at home may discontinue isolation under the following conditions:   At least 10 days have passed since symptom onset and  At least 24 hours have passed without running a fever (this means without the use of fever-reducing medications) and  Other symptoms have improved.  Persons infected with COVID-19 who never develop symptoms may discontinue isolation and other precautions 10 days after the date of their first positive COVID-19 test.

## 2019-12-06 NOTE — ED Provider Notes (Signed)
[facility]    CSN: [id number] Arrival date & time: 12/06/19  1723      History   Chief Complaint Chief Complaint  Patient presents with  . Cough    HPI Julian Atkinson is a 54 y.o. male.   Patient presents for productive cough for 1 week and 2 weeks of nasal congestion.  He reports for the last week he has had a productive cough with clear to yellow sputum.  He reports he believe this started as a tickle in his throat but now feels it is settled into his chest.  Reports all symptoms started about 2 weeks ago when he had a lot of nasal congestion that is persisted and worsened since then.  He has had cough throughout the day and can be worse at night.  Denies shortness of breath outside of when having coughing episodes.  Endorses some chest discomfort with cough but otherwise denies chest pain.  Denies fever or chills.  Denies abdominal pain, nausea, vomiting or diarrhea.     Past Medical History:  Diagnosis Date  . Diabetes mellitus without complication (HCC)   . GERD (gastroesophageal reflux disease)   . Hypertension   . Migraines   . Renal disorder     Patient Active Problem List   Diagnosis Date Noted  . Chronic obstructive pulmonary disease (HCC) 06/01/2017  . Diabetes mellitus (HCC) 04/30/2017  . Vertigo 04/30/2017  . Syncope 04/30/2017  . Tobacco dependence 04/30/2017  . Dehydration 10/06/2012  . High blood pressure 10/06/2012  . Lightheadedness 10/05/2012  . Tobacco abuse 10/05/2012  . Nausea 10/05/2012    History reviewed. No pertinent surgical history.     Home Medications    Prior to Admission medications   Medication Sig Start Date End Date Taking? Authorizing Provider  albuterol (VENTOLIN HFA) 108 (90 Base) MCG/ACT inhaler Inhale 2 puffs into the lungs every 6 (six) hours as needed for wheezing or shortness of breath. 09/06/19   Massie Maroon, FNP  aspirin EC 81 MG tablet Take 1 tablet (81 mg total) by mouth daily. 09/06/19    Massie Maroon, FNP  benzonatate (TESSALON) 100 MG capsule Take 1 capsule (100 mg total) by mouth every 8 (eight) hours. 12/06/19   Sahej Hauswirth, Veryl Speak, PA-C  doxycycline (VIBRAMYCIN) 100 MG capsule Take 1 capsule (100 mg total) by mouth 2 (two) times daily. 12/06/19   Tifany Hirsch, Veryl Speak, PA-C  fenofibrate (TRICOR) 145 MG tablet Take 1 tablet (145 mg total) by mouth daily. 09/06/19 09/05/20  Massie Maroon, FNP  hydrochlorothiazide (MICROZIDE) 12.5 MG capsule Take 1 capsule (12.5 mg total) by mouth daily. 09/06/19   Massie Maroon, FNP  lisinopril (ZESTRIL) 2.5 MG tablet Take 1 tablet (2.5 mg total) by mouth daily. 09/06/19   Massie Maroon, FNP  meclizine (ANTIVERT) 25 MG tablet Use as needed twice daily for dizziness 09/06/19   Massie Maroon, FNP  metFORMIN (GLUCOPHAGE) 1000 MG tablet TAKE 1 TABLET BY MOUTH 2 TIMES DAILY WITH A MEAL. 09/06/19   Massie Maroon, FNP  omeprazole (PRILOSEC) 20 MG capsule Take 1 capsule (20 mg total) by mouth daily. 09/06/19   Massie Maroon, FNP  rosuvastatin (CRESTOR) 20 MG tablet Take 1 tablet (20 mg total) by mouth daily. 09/06/19   Massie Maroon, FNP  sodium chloride (OCEAN) 0.65 % SOLN nasal spray Place 1 spray into both nostrils as needed for congestion. 12/06/19   Aadvika Konen, Veryl Speak, PA-C    Family  History No family history on file.  Social History Social History   Tobacco Use  . Smoking status: Current Every Day Smoker    Packs/day: 1.00    Years: 30.00    Pack years: 30.00    Types: Cigarettes  . Smokeless tobacco: Never Used  Substance Use Topics  . Alcohol use: No  . Drug use: Yes    Types: Marijuana    Comment: one or twice a week     Allergies   Codeine and Penicillins   Review of Systems Review of Systems  See HPI Physical Exam Triage Vital Signs ED Triage Vitals [12/06/19 1823]  Enc Vitals Group     BP 127/68     Pulse Rate 90     Resp 16     Temp 98.7 F (37.1 C)     Temp Source Oral     SpO2 95 %     Weight 207 lb  (93.9 kg)     Height 5\' 9"  (1.753 m)     Head Circumference      Peak Flow      Pain Score 0     Pain Loc      Pain Edu?      Excl. in GC?    No data found.  Updated Vital Signs BP 127/68   Pulse 90   Temp 98.7 F (37.1 C) (Oral)   Resp 16   Ht 5\' 9"  (1.753 m)   Wt 207 lb (93.9 kg)   SpO2 95%   BMI 30.57 kg/m   Visual Acuity Right Eye Distance:   Left Eye Distance:   Bilateral Distance:    Right Eye Near:   Left Eye Near:    Bilateral Near:     Physical Exam Vitals and nursing note reviewed.  Constitutional:      Appearance: He is well-developed.  HENT:     Head: Normocephalic and atraumatic.     Nose: Congestion present.     Comments: Turbinates with erythema and edema bilateral.  There is mucopurulent discharge in the right nostril    Mouth/Throat:     Comments: Postnasal drip is visible in the oropharynx otherwise no erythema or exudates Eyes:     Conjunctiva/sclera: Conjunctivae normal.  Cardiovascular:     Rate and Rhythm: Normal rate and regular rhythm.     Heart sounds: No murmur.  Pulmonary:     Effort: Pulmonary effort is normal. No respiratory distress.     Breath sounds: No wheezing.     Comments: There is some decreased air movement throughout however no crackles or rhonchi.  Speaking in full sentences saturating 95% on room air. Abdominal:     Palpations: Abdomen is soft.     Tenderness: There is no abdominal tenderness.  Musculoskeletal:     Cervical back: Neck supple.  Skin:    General: Skin is warm and dry.  Neurological:     General: No focal deficit present.     Mental Status: He is alert and oriented to person, place, and time.      UC Treatments / Results  Labs (all labs ordered are listed, but only abnormal results are displayed) Labs Reviewed  SARS CORONAVIRUS 2 (TAT 6-24 HRS)    EKG   Radiology DG Chest 2 View  Result Date: 12/06/2019 CLINICAL DATA:  Productive cough EXAM: CHEST - 2 VIEW COMPARISON:  08/03/2014  FINDINGS: Heart and mediastinal contours are within normal limits. No focal opacities or effusions. No  acute bony abnormality. IMPRESSION: No active cardiopulmonary disease. Electronically Signed   By: Rolm Baptise M.D.   On: 12/06/2019 19:53    Procedures Procedures (including critical care time)  Medications Ordered in UC Medications - No data to display  Initial Impression / Assessment and Plan / UC Course  I have reviewed the triage vital signs and the nursing notes.  Pertinent labs & imaging results that were available during my care of the patient were reviewed by me and considered in my medical decision making (see chart for details).     #Acute sinusitis #Cough Patient 54 year old with sinusitis and a postnasal drip driven cough.  Chest x-ray negative for pneumonia.  He is afebrile not in respiratory distress and saturating well on room air.  Given duration of sinus congestion with the worsening cough will start on doxycycline given penicillin allergy.  Cough medicine given.  Nasal saline for nasal congestion also recommended.  Discussed strict return, emergency department and follow-up precautions.  We also sent a Covid PCR.  Patient verbalized understanding of plan. Final Clinical Impressions(s) / UC Diagnoses   Final diagnoses:  Acute sinusitis, recurrence not specified, unspecified location  Cough     Discharge Instructions     Take the doxycycline 2 times a day for 10 days Take the tessalon for cough Use the saline nasal spray daily as much as you like  If worsening symptoms please return, follow up with your primary care in 7-10 days for re-evaluation.  If your Covid-19 test is positive, you will receive a phone call from [facility] regarding your results. Negative test results are not called. Both positive and negative results area always visible on MyChart. If you do not have a MyChart account, sign up instructions are in your discharge papers.   Persons who  are directed to care for themselves at home may discontinue isolation under the following conditions:  . At least 10 days have passed since symptom onset and . At least 24 hours have passed without running a fever (this means without the use of fever-reducing medications) and . Other symptoms have improved.  Persons infected with COVID-19 who never develop symptoms may discontinue isolation and other precautions 10 days after the date of their first positive COVID-19 test.      ED Prescriptions    Medication Sig Dispense Auth. Provider   doxycycline (VIBRAMYCIN) 100 MG capsule Take 1 capsule (100 mg total) by mouth 2 (two) times daily. 20 capsule Mckinnon Glick, Marguerita Beards, PA-C   benzonatate (TESSALON) 100 MG capsule Take 1 capsule (100 mg total) by mouth every 8 (eight) hours. 21 capsule Avea Mcgowen, Marguerita Beards, PA-C   sodium chloride (OCEAN) 0.65 % SOLN nasal spray Place 1 spray into both nostrils as needed for congestion. 44 mL Verdell Kincannon, Marguerita Beards, PA-C     PDMP not reviewed this encounter.   Purnell Shoemaker, PA-C 12/06/19 2036

## 2019-12-07 LAB — SARS CORONAVIRUS 2 (TAT 6-24 HRS): SARS Coronavirus 2: NEGATIVE

## 2019-12-20 ENCOUNTER — Encounter: Payer: Self-pay | Admitting: Family Medicine

## 2019-12-20 ENCOUNTER — Other Ambulatory Visit: Payer: Self-pay

## 2019-12-20 ENCOUNTER — Ambulatory Visit (INDEPENDENT_AMBULATORY_CARE_PROVIDER_SITE_OTHER): Payer: Self-pay | Admitting: Family Medicine

## 2019-12-20 VITALS — BP 127/71 | HR 66 | Temp 98.4°F | Ht 69.0 in | Wt 206.0 lb

## 2019-12-20 DIAGNOSIS — E781 Pure hyperglyceridemia: Secondary | ICD-10-CM

## 2019-12-20 DIAGNOSIS — E1149 Type 2 diabetes mellitus with other diabetic neurological complication: Secondary | ICD-10-CM

## 2019-12-20 DIAGNOSIS — I10 Essential (primary) hypertension: Secondary | ICD-10-CM

## 2019-12-20 DIAGNOSIS — F17200 Nicotine dependence, unspecified, uncomplicated: Secondary | ICD-10-CM

## 2019-12-20 LAB — POCT URINALYSIS DIPSTICK
Bilirubin, UA: NEGATIVE
Blood, UA: NEGATIVE
Glucose, UA: NEGATIVE
Ketones, UA: NEGATIVE
Leukocytes, UA: NEGATIVE
Nitrite, UA: NEGATIVE
Protein, UA: NEGATIVE
Spec Grav, UA: 1.025 (ref 1.010–1.025)
Urobilinogen, UA: 1 E.U./dL
pH, UA: 7 (ref 5.0–8.0)

## 2019-12-20 LAB — POCT GLYCOSYLATED HEMOGLOBIN (HGB A1C)
HbA1c POC (<> result, manual entry): 6.2 % (ref 4.0–5.6)
HbA1c, POC (controlled diabetic range): 6.2 % (ref 0.0–7.0)
HbA1c, POC (prediabetic range): 6.2 % (ref 5.7–6.4)
Hemoglobin A1C: 6.2 % — AB (ref 4.0–5.6)

## 2019-12-20 NOTE — Patient Instructions (Addendum)
Your hemoglobin a1C is down to 6.2.  Will follow up by phone with cholesterol results.  Your weight is down to 206 pounds, which is amazing. You are doing such a good job.   Try to remember to wear your sunscreen as discussed.  Diabetes Mellitus and Foot Care Foot care is an important part of your health, especially when you have diabetes. Diabetes may cause you to have problems because of poor blood flow (circulation) to your feet and legs, which can cause your skin to:  Become thinner and drier.  Break more easily.  Heal more slowly.  Peel and crack. You may also have nerve damage (neuropathy) in your legs and feet, causing decreased feeling in them. This means that you may not notice minor injuries to your feet that could lead to more serious problems. Noticing and addressing any potential problems early is the best way to prevent future foot problems. How to care for your feet Foot hygiene  Wash your feet daily with warm water and mild soap. Do not use hot water. Then, pat your feet and the areas between your toes until they are completely dry. Do not soak your feet as this can dry your skin.  Trim your toenails straight across. Do not dig under them or around the cuticle. File the edges of your nails with an emery board or nail file.  Apply a moisturizing lotion or petroleum jelly to the skin on your feet and to dry, brittle toenails. Use lotion that does not contain alcohol and is unscented. Do not apply lotion between your toes. Shoes and socks  Wear clean socks or stockings every day. Make sure they are not too tight. Do not wear knee-high stockings since they may decrease blood flow to your legs.  Wear shoes that fit properly and have enough cushioning. Always look in your shoes before you put them on to be sure there are no objects inside.  To break in new shoes, wear them for just a few hours a day. This prevents injuries on your feet. Wounds, scrapes, corns, and  calluses  Check your feet daily for blisters, cuts, bruises, sores, and redness. If you cannot see the bottom of your feet, use a mirror or ask someone for help.  Do not cut corns or calluses or try to remove them with medicine.  If you find a minor scrape, cut, or break in the skin on your feet, keep it and the skin around it clean and dry. You may clean these areas with mild soap and water. Do not clean the area with peroxide, alcohol, or iodine.  If you have a wound, scrape, corn, or callus on your foot, look at it several times a day to make sure it is healing and not infected. Check for: ? Redness, swelling, or pain. ? Fluid or blood. ? Warmth. ? Pus or a bad smell. General instructions  Do not cross your legs. This may decrease blood flow to your feet.  Do not use heating pads or hot water bottles on your feet. They may burn your skin. If you have lost feeling in your feet or legs, you may not know this is happening until it is too late.  Protect your feet from hot and cold by wearing shoes, such as at the beach or on hot pavement.  Schedule a complete foot exam at least once a year (annually) or more often if you have foot problems. If you have foot problems, report any cuts,  sores, or bruises to your health care provider immediately. Contact a health care provider if:  You have a medical condition that increases your risk of infection and you have any cuts, sores, or bruises on your feet.  You have an injury that is not healing.  You have redness on your legs or feet.  You feel burning or tingling in your legs or feet.  You have pain or cramps in your legs and feet.  Your legs or feet are numb.  Your feet always feel cold.  You have pain around a toenail. Get help right away if:  You have a wound, scrape, corn, or callus on your foot and: ? You have pain, swelling, or redness that gets worse. ? You have fluid or blood coming from the wound, scrape, corn, or  callus. ? Your wound, scrape, corn, or callus feels warm to the touch. ? You have pus or a bad smell coming from the wound, scrape, corn, or callus. ? You have a fever. ? You have a red line going up your leg. Summary  Check your feet every day for cuts, sores, red spots, swelling, and blisters.  Moisturize feet and legs daily.  Wear shoes that fit properly and have enough cushioning.  If you have foot problems, report any cuts, sores, or bruises to your health care provider immediately.  Schedule a complete foot exam at least once a year (annually) or more often if you have foot problems. This information is not intended to replace advice given to you by your health care provider. Make sure you discuss any questions you have with your health care provider. Document Revised: 03/30/2019 Document Reviewed: 08/08/2016 Elsevier Patient Education  [place].

## 2019-12-20 NOTE — Progress Notes (Signed)
[facility]   Established Patient Office Visit  Subjective:  Patient ID: Julian Atkinson, male    DOB: Mar 18, 1966  Age: 54 y.o. MRN: [id number]  CC:  Chief Complaint  Patient presents with  . Follow-up    3 month dm, htn    Dewayne Severe is a very pleasant 54 year old male with a medical history significant for type 2 diabetes mellitus and essential hypertension. Patient says that he has been doing well and is without complaint.   Diabetes His disease course has been stable. Pertinent negatives for hypoglycemia include no headaches, nervousness/anxiousness, pallor, seizures, speech difficulty, sweats or tremors. Pertinent negatives for diabetes include no blurred vision, no chest pain, no polydipsia, no polyphagia, no polyuria, no visual change, no weakness and no weight loss. Symptoms are stable. There are no diabetic complications. Pertinent negatives for diabetic complications include no CVA, peripheral neuropathy, PVD or retinopathy. Risk factors for coronary artery disease include diabetes mellitus and hypertension. He is compliant with treatment all of the time. He participates in exercise daily. There is no change in his home blood glucose trend. An ACE inhibitor/angiotensin II receptor blocker is being taken. He does not see a podiatrist.Eye exam is current.  Hypertension This is a chronic problem. The problem is controlled. Pertinent negatives include no blurred vision, chest pain, headaches, peripheral edema, shortness of breath or sweats. Risk factors for coronary artery disease include diabetes mellitus and obesity. Past treatments include nothing. There are no compliance problems.  There is no history of CVA, PVD or retinopathy. There is no history of hyperaldosteronism or sleep apnea.     Past Medical History:  Diagnosis Date  . Diabetes mellitus without complication (HCC)   . GERD (gastroesophageal reflux disease)   .  Hypertension   . Migraines   . Renal disorder     History reviewed. No pertinent surgical history.  History reviewed. No pertinent family history.  Social History   Socioeconomic History  . Marital status: Divorced    Spouse name: Not on file  . Number of children: Not on file  . Years of education: Not on file  . Highest education level: Not on file  Occupational History  . Not on file  Tobacco Use  . Smoking status: Current Every Day Smoker    Packs/day: 1.00    Years: 30.00    Pack years: 30.00    Types: Cigarettes  . Smokeless tobacco: Never Used  Substance and Sexual Activity  . Alcohol use: No  . Drug use: Yes    Types: Marijuana    Comment: one or twice a week  . Sexual activity: Not on file  Other Topics Concern  . Not on file  Social History Narrative  . Not on file   Social Determinants of Health   Financial Resource Strain:   . Difficulty of Paying Living Expenses:   Food Insecurity:   . Worried About Programme researcher, broadcasting/film/video in the Last Year:   . Barista in the Last Year:   Transportation Needs:   . Freight forwarder (Medical):   Marland Kitchen Lack of Transportation (Non-Medical):   Physical Activity:   . Days of Exercise per Week:   . Minutes of Exercise per Session:   Stress:   . Feeling of Stress :   Social Connections:   . Frequency of Communication with Friends and Family:   . Frequency of Social Gatherings with Friends and  Family:   . Attends Religious Services:   . Active Member of Clubs or Organizations:   . Attends Archivist Meetings:   Marland Kitchen Marital Status:   Intimate Partner Violence:   . Fear of Current or Ex-Partner:   . Emotionally Abused:   Marland Kitchen Physically Abused:   . Sexually Abused:     Outpatient Medications Prior to Visit  Medication Sig Dispense Refill  . albuterol (VENTOLIN HFA) 108 (90 Base) MCG/ACT inhaler Inhale 2 puffs into the lungs every 6 (six) hours as needed for wheezing or shortness of breath. 6.7 g 5  .  aspirin EC 81 MG tablet Take 1 tablet (81 mg total) by mouth daily. 90 tablet 3  . fenofibrate (TRICOR) 145 MG tablet Take 1 tablet (145 mg total) by mouth daily. 30 tablet 11  . hydrochlorothiazide (MICROZIDE) 12.5 MG capsule Take 1 capsule (12.5 mg total) by mouth daily. 30 capsule 5  . lisinopril (ZESTRIL) 2.5 MG tablet Take 1 tablet (2.5 mg total) by mouth daily. 30 tablet 11  . meclizine (ANTIVERT) 25 MG tablet Use as needed twice daily for dizziness 60 tablet 3  . metFORMIN (GLUCOPHAGE) 1000 MG tablet TAKE 1 TABLET BY MOUTH 2 TIMES DAILY WITH A MEAL. 180 tablet 2  . omeprazole (PRILOSEC) 20 MG capsule Take 1 capsule (20 mg total) by mouth daily. 30 capsule 5  . rosuvastatin (CRESTOR) 20 MG tablet Take 1 tablet (20 mg total) by mouth daily. 90 tablet 3  . benzonatate (TESSALON) 100 MG capsule Take 1 capsule (100 mg total) by mouth every 8 (eight) hours. (Patient not taking: Reported on 12/20/2019) 21 capsule 0  . doxycycline (VIBRAMYCIN) 100 MG capsule Take 1 capsule (100 mg total) by mouth 2 (two) times daily. (Patient not taking: Reported on 12/20/2019) 20 capsule 0  . sodium chloride (OCEAN) 0.65 % SOLN nasal spray Place 1 spray into both nostrils as needed for congestion. (Patient not taking: Reported on 12/20/2019) 44 mL 0   No facility-administered medications prior to visit.    Allergies  Allergen Reactions  . Codeine Nausea And Vomiting  . Penicillins Other (See Comments)    Childhood reaction    ROS Review of Systems  Constitutional: Negative for weight loss.  Eyes: Negative for blurred vision.  Respiratory: Negative for shortness of breath.   Cardiovascular: Negative for chest pain.  Endocrine: Negative for polydipsia, polyphagia and polyuria.  Skin: Negative for pallor.  Neurological: Negative for tremors, seizures, speech difficulty, weakness and headaches.  Psychiatric/Behavioral: The patient is not nervous/anxious.       Objective:    Physical Exam Constitutional:        Appearance: Normal appearance.  Eyes:     Pupils: Pupils are equal, round, and reactive to light.  Cardiovascular:     Rate and Rhythm: Normal rate and regular rhythm.     Pulses: Normal pulses.  Pulmonary:     Effort: Pulmonary effort is normal.  Abdominal:     General: Abdomen is flat. Bowel sounds are normal.  Musculoskeletal:        General: Normal range of motion.  Neurological:     General: No focal deficit present.     Mental Status: He is alert. Mental status is at baseline.  Psychiatric:        Mood and Affect: Mood normal.        Thought Content: Thought content normal.     BP 127/71 (BP Location: Left Arm, Patient Position: Sitting)  Pulse 66   Temp 98.4 F (36.9 C) (Temporal)   Ht 5\' 9"  (1.753 m)   Wt 206 lb (93.4 kg)   SpO2 97%   BMI 30.42 kg/m  Wt Readings from Last 3 Encounters:  12/20/19 206 lb (93.4 kg)  12/06/19 207 lb (93.9 kg)  08/16/19 223 lb (101.2 kg)     Health Maintenance Due  Topic Date Due  . PNEUMOCOCCAL POLYSACCHARIDE VACCINE AGE 42-64 HIGH RISK  Never done  . COVID-19 Vaccine (1) Never done  . COLONOSCOPY  Never done  . OPHTHALMOLOGY EXAM  12/24/2018    There are no preventive care reminders to display for this patient.  No results found for: TSH Lab Results  Component Value Date   WBC 8.7 03/03/2018   HGB 16.4 03/03/2018   HCT 47.7 03/03/2018   MCV 90 03/03/2018   PLT 198 04/30/2017   Lab Results  Component Value Date   NA 139 08/16/2019   K 4.1 08/16/2019   CO2 24 08/16/2019   GLUCOSE 216 (H) 08/16/2019   BUN 13 08/16/2019   CREATININE 0.91 08/16/2019   BILITOT 0.4 08/16/2019   ALKPHOS 66 08/16/2019   AST 13 08/16/2019   ALT 18 08/16/2019   PROT 6.6 08/16/2019   ALBUMIN 4.2 08/16/2019   CALCIUM 9.4 08/16/2019   ANIONGAP 9 10/26/2016   Lab Results  Component Value Date   CHOL 123 08/16/2019   Lab Results  Component Value Date   HDL 25 (L) 08/16/2019   Lab Results  Component Value Date   LDLCALC 48  08/16/2019   Lab Results  Component Value Date   TRIG 329 (H) 08/16/2019   Lab Results  Component Value Date   CHOLHDL 4.9 08/16/2019   Lab Results  Component Value Date   HGBA1C 6.2 (A) 12/20/2019   HGBA1C 6.2 12/20/2019   HGBA1C 6.2 12/20/2019   HGBA1C 6.2 12/20/2019      Assessment & Plan:   Problem List Items Addressed This Visit      Endocrine   Diabetes mellitus (HCC) - Primary   Relevant Orders   HgB A1c (Completed)   POCT Urinalysis Dipstick (Completed)   Lipid Panel   Basic Metabolic Panel     Other   Tobacco dependence    Other Visit Diagnoses    Hypertriglyceridemia       Relevant Orders   Lipid Panel      Type 2 diabetes mellitus with other neurologic complication, without long-term current use of insulin (HCC) Hemoglobin a1C has improved to 6.2 from 7.9. Patient commended for changing diet and exercise regimen. No further medication changes warranted.  - HgB A1c - POCT Urinalysis Dipstick - Lipid Panel - Basic Metabolic Panel  Essential hypertension:  - Continue medication, monitor blood pressure at home. Continue DASH diet. Reminder to go to the ER if any CP, SOB, nausea, dizziness, severe HA, changes vision/speech, left arm numbness and tingling and jaw pain.     Hypertriglyceridemia The patient is asked to make an attempt to improve diet and exercise patterns to aid in medical management of this problem.  - Lipid Panel  Tobacco dependence Smoking cessation instruction/counseling given:  counseled patient on the dangers of tobacco use, advised patient to stop smoking, and reviewed strategies to maximize success  Follow-up: Return in about 6 months (around 06/20/2020) for hypertension, hyperlipidemia.    14/07/2019  APRN, MSN, FNP-C [facility] [address] [place], [place]  [address] [phone]  

## 2019-12-21 ENCOUNTER — Telehealth: Payer: Self-pay

## 2019-12-21 ENCOUNTER — Telehealth: Payer: Self-pay | Admitting: Family Medicine

## 2019-12-21 LAB — BASIC METABOLIC PANEL
BUN/Creatinine Ratio: 16 (ref 9–20)
BUN: 12 mg/dL (ref 6–24)
CO2: 21 mmol/L (ref 20–29)
Calcium: 9 mg/dL (ref 8.7–10.2)
Chloride: 101 mmol/L (ref 96–106)
Creatinine, Ser: 0.73 mg/dL — ABNORMAL LOW (ref 0.76–1.27)
GFR calc Af Amer: 121 mL/min/{1.73_m2} (ref >59)
GFR calc non Af Amer: 104 mL/min/{1.73_m2} (ref >59)
Glucose: 103 mg/dL — ABNORMAL HIGH (ref 65–99)
Potassium: 4.2 mmol/L (ref 3.5–5.2)
Sodium: 141 mmol/L (ref 134–144)

## 2019-12-21 LAB — LIPID PANEL
Chol/HDL Ratio: 3.9 ratio (ref 0.0–5.0)
Cholesterol, Total: 117 mg/dL (ref 100–199)
HDL: 30 mg/dL — ABNORMAL LOW (ref >39)
LDL Chol Calc (NIH): 66 mg/dL (ref 0–99)
Triglycerides: 114 mg/dL (ref 0–149)
VLDL Cholesterol Cal: 21 mg/dL (ref 5–40)

## 2019-12-21 NOTE — Telephone Encounter (Signed)
Called pt informed him of  his results

## 2019-12-21 NOTE — Telephone Encounter (Signed)
-----   Message from Lachina M Hollis, FNP sent at 12/21/2019 11:14 AM EDT ----- Regarding: lab results Please inform patient that tryglycerides have greatly improved on current diet and medication regimen. No changes are warranted at this time. Follow up in office as scheduled.    Lachina Moore Hollis  APRN, MSN, FNP-C [facility] [address]  [place], [place] [address] [phone]   

## 2019-12-21 NOTE — Telephone Encounter (Signed)
Called lvm for patient to return our call.

## 2019-12-21 NOTE — Telephone Encounter (Signed)
Called and spoke w/ patient , informed pt of his results and continue with medication.

## 2019-12-21 NOTE — Telephone Encounter (Signed)
-----   Message from Massie Maroon, [place] sent at 12/21/2019 11:14 AM EDT ----- Regarding: lab results Please inform patient that tryglycerides have greatly improved on current diet and medication regimen. No changes are warranted at this time. Follow up in office as scheduled.    [name]  APRN, MSN, FNP-C [facility] [address] [place], [place] [address] [phone]

## 2020-01-05 MED FILL — LISINOPRIL 2.5 MG TABLET: 2.5 | 30 days supply | Qty: 30 | Fill #4

## 2020-01-05 MED FILL — ?FENOFIBRATE 145 MG TABLET: 145 | 30 days supply | Qty: 30 | Fill #10

## 2020-01-05 MED FILL — ?OMEPRAZOLE 20 MG CAP DR: 20 | 30 days supply | Qty: 30 | Fill #4

## 2020-01-05 MED FILL — ROSUVASTATIN CALCIUM 20 MG: 20 | 30 days supply | Qty: 30 | Fill #8

## 2020-01-05 MED FILL — !VENTOLIN HFA INHALER: 108 (90 BAS | 25 days supply | Qty: 18 | Fill #4

## 2020-01-05 MED FILL — metFORMIN HCL 1000 MG TABS: 1000 | 30 days supply | Qty: 60 | Fill #1

## 2020-01-05 MED FILL — HYDROCHLOROTHIAZIDE 12.5 MG: 12.5 | 30 days supply | Qty: 30 | Fill #4

## 2020-01-06 MED FILL — ?MECLIZINE 25MG TAB: 25 | 15 days supply | Qty: 30 | Fill #0

## 2020-02-06 ENCOUNTER — Other Ambulatory Visit: Payer: Self-pay | Admitting: Family Medicine

## 2020-02-06 ENCOUNTER — Other Ambulatory Visit: Payer: Self-pay | Admitting: Nurse Practitioner

## 2020-02-06 DIAGNOSIS — E1149 Type 2 diabetes mellitus with other diabetic neurological complication: Secondary | ICD-10-CM

## 2020-02-06 MED ORDER — METFORMIN HCL 1000 MG PO TABS: ORAL_TABLET | ORAL | 2 refills | Status: DC

## 2020-02-06 MED FILL — metFORMIN HCL 1000 MG TABS: 1000 | 30 days supply | Qty: 60 | Fill #0

## 2020-02-06 NOTE — Telephone Encounter (Signed)
Medication: metFORMIN (GLUCOPHAGE) 1000 MG tablet [[id number]] ,   Has the patient contacted their pharmacy?Yes  (Agent: If no, request that the patient contact the pharmacy for the refill.) (Agent: If yes, when and what did the pharmacy advise?)  Preferred Pharmacy (with phone number or street name): [facility] [place], [place] - [place] [address]  Phone:  [phone] Fax:  [phone]     Agent: Please be advised that RX refills may take up to 3 business days. We ask that you follow-up with your pharmacy.

## 2020-02-07 MED FILL — ROSUVASTATIN CALCIUM 20 MG: 20 | 30 days supply | Qty: 30 | Fill #9

## 2020-02-07 MED FILL — ?FENOFIBRATE 145 MG TABLET: 145 | 30 days supply | Qty: 30 | Fill #11

## 2020-02-07 MED FILL — ?OMEPRAZOLE 20 MG CPDR: 20 | 30 days supply | Qty: 30 | Fill #5

## 2020-02-07 MED FILL — LISINOPRIL 2.5 MG TABLET: 2.5 | 30 days supply | Qty: 30 | Fill #5

## 2020-02-07 MED FILL — ?MECLIZINE 25MG TAB: 25 | 15 days supply | Qty: 30 | Fill #0

## 2020-02-07 MED FILL — HYDROCHLOROTHIAZIDE 12.5 MG: 12.5 | 30 days supply | Qty: 30 | Fill #5

## 2020-03-06 MED FILL — ?MECLIZINE 25MG TAB: 25 | 15 days supply | Qty: 30 | Fill #1

## 2020-03-06 MED FILL — LISINOPRIL 2.5 MG TABLET: 2.5 | 30 days supply | Qty: 30 | Fill #6

## 2020-03-06 MED FILL — HYDROCHLOROTHIAZIDE 12.5 MG: 12.5 | 30 days supply | Qty: 30 | Fill #0

## 2020-03-06 MED FILL — ?OMEPRAZOLE 20 MG CPDR: 20 | 30 days supply | Qty: 30 | Fill #0

## 2020-03-06 MED FILL — ROSUVASTATIN CALCIUM 20 MG: 20 | 30 days supply | Qty: 30 | Fill #10

## 2020-03-06 MED FILL — ?FENOFIBRATE 145 MG TABLET: 145 | 30 days supply | Qty: 30 | Fill #0

## 2020-04-03 ENCOUNTER — Other Ambulatory Visit: Payer: Self-pay | Admitting: Family Medicine

## 2020-04-03 DIAGNOSIS — E781 Pure hyperglyceridemia: Secondary | ICD-10-CM

## 2020-04-03 MED FILL — ?OMEPRAZOLE 20 MG CPDR: 20 | 30 days supply | Qty: 30 | Fill #1

## 2020-04-03 MED FILL — ROSUVASTATIN CALCIUM 20 MG: 20 | 30 days supply | Qty: 30 | Fill #0

## 2020-04-03 MED FILL — LISINOPRIL 2.5 MG TABLET: 2.5 | 30 days supply | Qty: 30 | Fill #7

## 2020-04-03 MED FILL — HYDROCHLOROTHIAZIDE 12.5 MG: 12.5 | 30 days supply | Qty: 30 | Fill #1

## 2020-04-03 MED FILL — ?MECLIZINE 25MG TAB: 25 | 15 days supply | Qty: 30 | Fill #2

## 2020-04-03 MED FILL — ?FENOFIBRATE 145 MG TABLET: 145 | 30 days supply | Qty: 30 | Fill #1

## 2020-05-07 ENCOUNTER — Other Ambulatory Visit: Payer: Self-pay | Admitting: Family Medicine

## 2020-05-07 DIAGNOSIS — R42 Dizziness and giddiness: Secondary | ICD-10-CM

## 2020-05-07 MED FILL — HYDROCHLOROTHIAZIDE 12.5 MG: 12.5 | 30 days supply | Qty: 30 | Fill #2

## 2020-05-07 MED FILL — LISINOPRIL 2.5 MG TABLET: 2.5 | 30 days supply | Qty: 30 | Fill #8

## 2020-05-07 MED FILL — OMEPRAZOLE 20 MG CAP: 20 | 30 days supply | Qty: 30 | Fill #2

## 2020-05-07 MED FILL — ROSUVASTATIN CALCIUM 20 MG: 20 | 30 days supply | Qty: 30 | Fill #1

## 2020-05-07 MED FILL — ?FENOFIBRATE 145 MG TABLET: 145 | 30 days supply | Qty: 30 | Fill #2

## 2020-05-07 MED FILL — ?MECLIZINE 25MG TAB: 25 | 15 days supply | Qty: 30 | Fill #0

## 2020-05-07 NOTE — Telephone Encounter (Signed)
Please see medication request. Thank you!

## 2020-06-08 MED FILL — FENOFIBRATE 145 MG TABLET: 145 | 30 days supply | Qty: 30 | Fill #3

## 2020-06-08 MED FILL — METFORMIN HCL 1000 MG TABS: 1000 | 30 days supply | Qty: 60 | Fill #1

## 2020-06-08 MED FILL — OMEPRAZOLE 20 MG CAP: 20 | 30 days supply | Qty: 30 | Fill #3

## 2020-06-08 MED FILL — LISINOPRIL 2.5 MG TABLET: 2.5 | 30 days supply | Qty: 30 | Fill #9

## 2020-06-08 MED FILL — ROSUVASTATIN CALCIUM 20 MG: 20 | 30 days supply | Qty: 30 | Fill #2

## 2020-06-08 MED FILL — HYDROCHLOROTHIAZIDE 12.5 MG: 12.5 | 30 days supply | Qty: 30 | Fill #3

## 2020-06-26 ENCOUNTER — Ambulatory Visit: Payer: Self-pay | Admitting: Family Medicine

## 2020-07-03 ENCOUNTER — Other Ambulatory Visit: Payer: Self-pay

## 2020-07-03 MED FILL — AZITHROMYCIN 250 MG TABLET: 250 | 5 days supply | Qty: 6 | Fill #0

## 2020-07-10 MED FILL — LISINOPRIL 2.5 MG TABLET: 2.5 | 30 days supply | Qty: 30 | Fill #10

## 2020-07-10 MED FILL — METFORMIN HCL 1000 MG TABS: 1000 | 30 days supply | Qty: 60 | Fill #2

## 2020-07-10 MED FILL — HYDROCHLOROTHIAZIDE 12.5 MG: 12.5 | 30 days supply | Qty: 30 | Fill #4

## 2020-07-10 MED FILL — ROSUVASTATIN CALCIUM 20 MG: 20 | 30 days supply | Qty: 30 | Fill #3

## 2020-07-10 MED FILL — MECLIZINE 25 MG TABLET: 25 | 15 days supply | Qty: 30 | Fill #1

## 2020-07-10 MED FILL — FENOFIBRATE 145 MG TABLET: 145 | 30 days supply | Qty: 30 | Fill #4

## 2020-07-10 MED FILL — OMEPRAZOLE 20 MG CAP: 20 | 30 days supply | Qty: 30 | Fill #4

## 2020-07-10 MED FILL — !VENTOLIN HFA INHALER: 108 (90 BAS | 25 days supply | Qty: 18 | Fill #5

## 2020-08-10 MED FILL — METFORMIN HCL 1000 MG TABS: 1000 | 30 days supply | Qty: 60 | Fill #3

## 2020-08-10 MED FILL — OMEPRAZOLE 20 MG CAP: 20 | 30 days supply | Qty: 30 | Fill #5

## 2020-08-10 MED FILL — LISINOPRIL 2.5 MG TABLET: 2.5 | 30 days supply | Qty: 30 | Fill #11

## 2020-08-10 MED FILL — FENOFIBRATE 145 MG TABLET: 145 | 30 days supply | Qty: 30 | Fill #5

## 2020-08-10 MED FILL — MECLIZINE 25 MG TABLET: 25 | 15 days supply | Qty: 30 | Fill #2

## 2020-08-10 MED FILL — HYDROCHLOROTHIAZIDE 12.5 MG: 12.5 | 30 days supply | Qty: 30 | Fill #5

## 2020-08-10 MED FILL — ROSUVASTATIN CALCIUM 20 MG: 20 | 30 days supply | Qty: 30 | Fill #4

## 2020-08-14 ENCOUNTER — Encounter: Payer: Self-pay | Admitting: Family Medicine

## 2020-08-14 ENCOUNTER — Ambulatory Visit (INDEPENDENT_AMBULATORY_CARE_PROVIDER_SITE_OTHER): Payer: Self-pay | Admitting: Family Medicine

## 2020-08-14 ENCOUNTER — Other Ambulatory Visit: Payer: Self-pay

## 2020-08-14 VITALS — BP 133/79 | HR 77 | Temp 99.1°F | Ht 69.0 in | Wt 217.8 lb

## 2020-08-14 DIAGNOSIS — Z9189 Other specified personal risk factors, not elsewhere classified: Secondary | ICD-10-CM

## 2020-08-14 DIAGNOSIS — Z23 Encounter for immunization: Secondary | ICD-10-CM

## 2020-08-14 DIAGNOSIS — Z8701 Personal history of pneumonia (recurrent): Secondary | ICD-10-CM

## 2020-08-14 DIAGNOSIS — I10 Essential (primary) hypertension: Secondary | ICD-10-CM

## 2020-08-14 DIAGNOSIS — Z1211 Encounter for screening for malignant neoplasm of colon: Secondary | ICD-10-CM

## 2020-08-14 DIAGNOSIS — F17200 Nicotine dependence, unspecified, uncomplicated: Secondary | ICD-10-CM

## 2020-08-14 DIAGNOSIS — E781 Pure hyperglyceridemia: Secondary | ICD-10-CM

## 2020-08-14 DIAGNOSIS — E1149 Type 2 diabetes mellitus with other diabetic neurological complication: Secondary | ICD-10-CM

## 2020-08-14 LAB — POCT GLYCOSYLATED HEMOGLOBIN (HGB A1C)
HbA1c POC (<> result, manual entry): 7.2 % (ref 4.0–5.6)
HbA1c, POC (controlled diabetic range): 7.2 % — AB (ref 0.0–7.0)
HbA1c, POC (prediabetic range): 7.2 % — AB (ref 5.7–6.4)
Hemoglobin A1C: 7.2 % — AB (ref 4.0–5.6)

## 2020-08-14 LAB — POCT URINALYSIS DIPSTICK
Bilirubin, UA: NEGATIVE
Blood, UA: NEGATIVE
Glucose, UA: NEGATIVE
Ketones, UA: NEGATIVE
Leukocytes, UA: NEGATIVE
Nitrite, UA: NEGATIVE
Protein, UA: NEGATIVE
Spec Grav, UA: >=1.03 — AB (ref 1.010–1.025)
Urobilinogen, UA: 0.2 E.U./dL
pH, UA: 7 (ref 5.0–8.0)

## 2020-08-14 NOTE — Progress Notes (Signed)
[facility]   Established Patient Office Visit  Subjective:  Patient ID: Julian Atkinson, male    DOB: 07/18/1966  Age: 55 y.o. MRN: [id number]  CC:  Chief Complaint  Patient presents with  . Follow-up    6 month follow up    Prem Coykendall is a 55 year old male with a medical history significant for type 2 diabetes mellitus, hypertension, and hyperlipidemia presents for follow-up of chronic conditions.  Patient says that he has been doing well and is without complaint on today.  He says that he has not been following a diet or been very active over the past month due to changes in weather and staying at home to prevent contracting Covid infection. Patient does not check blood glucose or blood pressure at home.  Diabetes He presents for his follow-up diabetic visit. He has type 2 diabetes mellitus. Pertinent negatives for hypoglycemia include no dizziness, headaches, hunger, mood changes, nervousness/anxiousness, pallor, seizures, sweats or tremors. Pertinent negatives for diabetes include no blurred vision, no polydipsia, no polyphagia and no polyuria. Pertinent negatives for diabetic complications include no CVA. Risk factors for coronary artery disease include male sex, hypertension, dyslipidemia and diabetes mellitus. Current diabetic treatment includes diet and oral agent (monotherapy).  Hypertension This is a chronic problem. The problem is controlled. Pertinent negatives include no anxiety, blurred vision, headaches, malaise/fatigue, neck pain or sweats. There are no compliance problems.  There is no history of kidney disease, CAD/MI, CVA, heart failure or left ventricular hypertrophy.     Past Medical History:  Diagnosis Date  . Diabetes mellitus without complication (HCC)   . GERD (gastroesophageal reflux disease)   . Hypertension   . Migraines   . Renal disorder     History reviewed. No pertinent surgical history.  History  reviewed. No pertinent family history.  Social History   Socioeconomic History  . Marital status: Divorced    Spouse name: Not on file  . Number of children: Not on file  . Years of education: Not on file  . Highest education level: Not on file  Occupational History  . Not on file  Tobacco Use  . Smoking status: Current Every Day Smoker    Packs/day: 1.00    Years: 30.00    Pack years: 30.00    Types: Cigarettes  . Smokeless tobacco: Never Used  Vaping Use  . Vaping Use: Never used  Substance and Sexual Activity  . Alcohol use: No  . Drug use: Yes    Types: Marijuana    Comment: one or twice a week  . Sexual activity: Not on file  Other Topics Concern  . Not on file  Social History Narrative  . Not on file   Social Determinants of Health   Financial Resource Strain: Not on file  Food Insecurity: Not on file  Transportation Needs: Not on file  Physical Activity: Not on file  Stress: Not on file  Social Connections: Not on file  Intimate Partner Violence: Not on file    Outpatient Medications Prior to Visit  Medication Sig Dispense Refill  . albuterol (VENTOLIN HFA) 108 (90 Base) MCG/ACT inhaler Inhale 2 puffs into the lungs every 6 (six) hours as needed for wheezing or shortness of breath. 6.7 g 5  . aspirin EC 81 MG tablet Take 1 tablet (81 mg total) by mouth daily. 90 tablet 3  . benzonatate (TESSALON) 100 MG capsule Take 1 capsule (100 mg total)  by mouth every 8 (eight) hours. 21 capsule 0  . doxycycline (VIBRAMYCIN) 100 MG capsule Take 1 capsule (100 mg total) by mouth 2 (two) times daily. 20 capsule 0  . fenofibrate (TRICOR) 145 MG tablet Take 1 tablet (145 mg total) by mouth daily. 30 tablet 11  . hydrochlorothiazide (MICROZIDE) 12.5 MG capsule Take 1 capsule (12.5 mg total) by mouth daily. 30 capsule 5  . lisinopril (ZESTRIL) 2.5 MG tablet Take 1 tablet (2.5 mg total) by mouth daily. 30 tablet 11  . meclizine (ANTIVERT) 25 MG tablet TAKE 1 TABLET BY MOUTH  TWICE DAILY AS NEEDED FOR DIZZINESS. 30 tablet 2  . metFORMIN (GLUCOPHAGE) 1000 MG tablet TAKE 1 TABLET BY MOUTH 2 TIMES DAILY WITH A MEAL. 180 tablet 2  . omeprazole (PRILOSEC) 20 MG capsule Take 1 capsule (20 mg total) by mouth daily. 30 capsule 5  . rosuvastatin (CRESTOR) 20 MG tablet Take 1 tablet (20 mg total) by mouth daily. 90 tablet 3  . sodium chloride (OCEAN) 0.65 % SOLN nasal spray Place 1 spray into both nostrils as needed for congestion. 44 mL 0   No facility-administered medications prior to visit.    Allergies  Allergen Reactions  . Codeine Nausea And Vomiting  . Penicillins Other (See Comments)    Childhood reaction    ROS Review of Systems  Constitutional: Negative.  Negative for malaise/fatigue.  HENT: Negative.   Eyes: Negative.  Negative for blurred vision.  Respiratory: Negative.   Cardiovascular: Negative.   Gastrointestinal: Negative.   Endocrine: Negative for polydipsia, polyphagia and polyuria.  Genitourinary: Negative.   Musculoskeletal: Negative.  Negative for neck pain.  Skin: Negative for pallor.  Neurological: Negative.  Negative for dizziness, tremors, seizures and headaches.  Hematological: Negative.   Psychiatric/Behavioral: Negative.  The patient is not nervous/anxious.       Objective:    Physical Exam Constitutional:      Appearance: He is obese.  HENT:     Head: Atraumatic.     Mouth/Throat:     Mouth: Mucous membranes are moist.     Pharynx: Oropharynx is clear.  Cardiovascular:     Rate and Rhythm: Normal rate and regular rhythm.  Pulmonary:     Effort: Pulmonary effort is normal.  Abdominal:     General: Bowel sounds are normal.  Musculoskeletal:        General: Normal range of motion.  Skin:    General: Skin is warm.  Neurological:     General: No focal deficit present.  Psychiatric:        Mood and Affect: Mood normal.        Behavior: Behavior normal.        Thought Content: Thought content normal.     BP 133/79    Pulse 77   Temp 99.1 F (37.3 C) (Temporal)   Ht 5\' 9"  (1.753 m)   Wt 217 lb 12.8 oz (98.8 kg)   SpO2 95%   BMI 32.16 kg/m  Wt Readings from Last 3 Encounters:  08/14/20 217 lb 12.8 oz (98.8 kg)  12/20/19 206 lb (93.4 kg)  12/06/19 207 lb (93.9 kg)     Health Maintenance Due  Topic Date Due  . PNEUMOCOCCAL POLYSACCHARIDE VACCINE AGE 37-64 HIGH RISK  Never done  . COVID-19 Vaccine (1) Never done  . COLONOSCOPY (Pts 45-36yrs Insurance coverage will need to be confirmed)  Never done  . OPHTHALMOLOGY EXAM  12/24/2018    There are no preventive care reminders  to display for this patient.  No results found for: TSH Lab Results  Component Value Date   WBC 8.7 03/03/2018   HGB 16.4 03/03/2018   HCT 47.7 03/03/2018   MCV 90 03/03/2018   PLT 198 04/30/2017   Lab Results  Component Value Date   NA 141 12/20/2019   K 4.2 12/20/2019   CO2 21 12/20/2019   GLUCOSE 103 (H) 12/20/2019   BUN 12 12/20/2019   CREATININE 0.73 (L) 12/20/2019   BILITOT 0.4 08/16/2019   ALKPHOS 66 08/16/2019   AST 13 08/16/2019   ALT 18 08/16/2019   PROT 6.6 08/16/2019   ALBUMIN 4.2 08/16/2019   CALCIUM 9.0 12/20/2019   ANIONGAP 9 10/26/2016   Lab Results  Component Value Date   CHOL 117 12/20/2019   Lab Results  Component Value Date   HDL 30 (L) 12/20/2019   Lab Results  Component Value Date   LDLCALC 66 12/20/2019   Lab Results  Component Value Date   TRIG 114 12/20/2019   Lab Results  Component Value Date   CHOLHDL 3.9 12/20/2019   Lab Results  Component Value Date   HGBA1C 7.2 (A) 08/14/2020   HGBA1C 7.2 08/14/2020   HGBA1C 7.2 (A) 08/14/2020   HGBA1C 7.2 (A) 08/14/2020      Assessment & Plan:   Problem List Items Addressed This Visit      Endocrine   Diabetes mellitus (HCC) - Primary   Relevant Orders   HgB A1c (Completed)     1. Type 2 diabetes mellitus with other neurologic complication, without long-term current use of insulin (HCC) Hemoglobin A1c has  increased to 7.2 from 6.26 months prior.  Patient advised to take medication consistently and follow a low carbohydrate diet divided over small meals throughout the day. - HgB A1c - Comprehensive metabolic panel - POCT Urinalysis Dipstick  2. Colon cancer screening  - Ambulatory referral to Gastroenterology  3. History of pneumonia as indication for 23-polyvalent pneumococcal polysaccharide vaccine  - Pneumococcal polysaccharide vaccine 23-valent greater than or equal to 2yo subcutaneous/IM  4. Tobacco dependence Smoking cessation instruction/counseling given:  counseled patient on the dangers of tobacco use, advised patient to stop smoking, and reviewed strategies to maximize success  5. Essential hypertension - Continue medication, monitor blood pressure at home. Continue DASH diet. Reminder to go to the ER if any CP, SOB, nausea, dizziness, severe HA, changes vision/speech, left arm numbness and tingling and jaw pain.    - Comprehensive metabolic panel - POCT Urinalysis Dipstick  6. Hypertriglyceridemia The patient is asked to make an attempt to improve diet and exercise patterns to aid in medical management of this problem.  Follow-up: Return in about 6 months (around 02/11/2021).    [name]  APRN, MSN, FNP-C [facility] [address] [place], [place] [address] [phone]

## 2020-08-15 ENCOUNTER — Telehealth: Payer: Self-pay | Admitting: Family Medicine

## 2020-08-15 LAB — COMPREHENSIVE METABOLIC PANEL
ALT: 18 IU/L (ref 0–44)
AST: 13 IU/L (ref 0–40)
Albumin/Globulin Ratio: 1.7 (ref 1.2–2.2)
Albumin: 4.5 g/dL (ref 3.8–4.9)
Alkaline Phosphatase: 53 IU/L (ref 44–121)
BUN/Creatinine Ratio: 23 — ABNORMAL HIGH (ref 9–20)
BUN: 19 mg/dL (ref 6–24)
Bilirubin Total: 0.5 mg/dL (ref 0.0–1.2)
CO2: 23 mmol/L (ref 20–29)
Calcium: 9.4 mg/dL (ref 8.7–10.2)
Chloride: 99 mmol/L (ref 96–106)
Creatinine, Ser: 0.83 mg/dL (ref 0.76–1.27)
GFR calc Af Amer: 115 mL/min/{1.73_m2} (ref >59)
GFR calc non Af Amer: 99 mL/min/{1.73_m2} (ref >59)
Globulin, Total: 2.6 g/dL (ref 1.5–4.5)
Glucose: 118 mg/dL — ABNORMAL HIGH (ref 65–99)
Potassium: 4.2 mmol/L (ref 3.5–5.2)
Sodium: 138 mmol/L (ref 134–144)
Total Protein: 7.1 g/dL (ref 6.0–8.5)

## 2020-08-15 NOTE — Telephone Encounter (Signed)
Left voicemail to call back for results.   [phone]: stated wrong number [phone]: left voicemail

## 2020-09-12 ENCOUNTER — Other Ambulatory Visit: Payer: Self-pay | Admitting: Family Medicine

## 2020-09-12 ENCOUNTER — Other Ambulatory Visit: Payer: Self-pay | Admitting: Nurse Practitioner

## 2020-09-12 DIAGNOSIS — I10 Essential (primary) hypertension: Secondary | ICD-10-CM

## 2020-09-12 DIAGNOSIS — K219 Gastro-esophageal reflux disease without esophagitis: Secondary | ICD-10-CM

## 2020-09-12 DIAGNOSIS — R42 Dizziness and giddiness: Secondary | ICD-10-CM

## 2020-09-12 DIAGNOSIS — E781 Pure hyperglyceridemia: Secondary | ICD-10-CM

## 2020-09-12 MED FILL — OMEPRAZOLE 20 MG CAP: 20 | 30 days supply | Qty: 30 | Fill #0

## 2020-09-12 MED FILL — ROSUVASTATIN CALCIUM 20 MG: 20 | 30 days supply | Qty: 30 | Fill #0

## 2020-09-12 MED FILL — METFORMIN HCL 1000 MG TABS: 1000 | 30 days supply | Qty: 60 | Fill #4

## 2020-09-12 MED FILL — FENOFIBRATE 145 MG TABLET: 145 | 30 days supply | Qty: 30 | Fill #0

## 2020-09-12 MED FILL — HYDROCHLOROTHIAZIDE 12.5 MG: 12.5 | 30 days supply | Qty: 30 | Fill #0

## 2020-09-12 MED FILL — MECLIZINE 25 MG TABLET: 25 | 30 days supply | Qty: 30 | Fill #0

## 2020-09-12 MED FILL — LISINOPRIL 2.5 MG TABLET: 2.5 | 30 days supply | Qty: 30 | Fill #0

## 2020-10-15 MED FILL — METFORMIN HCL 1000 MG TABS: 1000 | 30 days supply | Qty: 60 | Fill #5

## 2020-10-15 MED FILL — FENOFIBRATE 145 MG TABLET: 145 | 30 days supply | Qty: 30 | Fill #1

## 2020-10-15 MED FILL — ROSUVASTATIN CALCIUM 20 MG: 20 | 30 days supply | Qty: 30 | Fill #1

## 2020-10-15 MED FILL — LISINOPRIL 2.5 MG TABLET: 2.5 | 30 days supply | Qty: 30 | Fill #1

## 2020-10-15 MED FILL — MECLIZINE 25 MG TABLET: 25 | 15 days supply | Qty: 30 | Fill #1

## 2020-10-15 MED FILL — OMEPRAZOLE 20 MG CAP: 20 | 30 days supply | Qty: 30 | Fill #1

## 2020-10-15 MED FILL — HYDROCHLOROTHIAZIDE 12.5 MG: 12.5 | 30 days supply | Qty: 30 | Fill #1

## 2020-10-20 ENCOUNTER — Other Ambulatory Visit: Payer: Self-pay

## 2020-11-14 ENCOUNTER — Other Ambulatory Visit: Payer: Self-pay

## 2020-11-14 MED FILL — Lisinopril Tab 2.5 MG: ORAL | 30 days supply | Qty: 30 | Fill #0 | Status: AC

## 2020-11-14 MED FILL — Rosuvastatin Calcium Tab 20 MG: ORAL | 30 days supply | Qty: 30 | Fill #0 | Status: AC

## 2020-11-14 MED FILL — Meclizine HCl Tab 25 MG: ORAL | 15 days supply | Qty: 30 | Fill #0 | Status: AC

## 2020-11-14 MED FILL — Hydrochlorothiazide Cap 12.5 MG: ORAL | 30 days supply | Qty: 30 | Fill #0 | Status: AC

## 2020-11-14 MED FILL — Fenofibrate Tab 145 MG: ORAL | 30 days supply | Qty: 30 | Fill #0 | Status: AC

## 2020-11-14 MED FILL — Metformin HCl Tab 1000 MG: ORAL | 30 days supply | Qty: 60 | Fill #0 | Status: AC

## 2020-11-14 MED FILL — Omeprazole Cap Delayed Release 20 MG: ORAL | 30 days supply | Qty: 30 | Fill #0 | Status: AC

## 2020-11-15 ENCOUNTER — Other Ambulatory Visit: Payer: Self-pay

## 2020-12-19 ENCOUNTER — Other Ambulatory Visit: Payer: Self-pay

## 2020-12-19 ENCOUNTER — Other Ambulatory Visit: Payer: Self-pay | Admitting: Family Medicine

## 2020-12-19 DIAGNOSIS — R42 Dizziness and giddiness: Secondary | ICD-10-CM

## 2020-12-19 DIAGNOSIS — J449 Chronic obstructive pulmonary disease, unspecified: Secondary | ICD-10-CM

## 2020-12-19 MED ORDER — ALBUTEROL SULFATE HFA 108 (90 BASE) MCG/ACT IN AERS
2.0000 | INHALATION_SPRAY | Freq: Four times a day (QID) | RESPIRATORY_TRACT | 5 refills | Status: DC | PRN
  Filled 2020-12-19: qty 8.5, 25d supply, fill #0
  Filled 2021-01-18: qty 8.5, 25d supply, fill #1
  Filled 2021-02-27: qty 8.5, 25d supply, fill #2
  Filled 2021-03-29: qty 8.5, 25d supply, fill #3
  Filled 2021-05-02: qty 8.5, 25d supply, fill #4

## 2020-12-19 MED ORDER — MECLIZINE HCL 25 MG PO TABS
ORAL_TABLET | ORAL | 2 refills | Status: DC
  Filled 2020-12-19: qty 30, 15d supply, fill #0
  Filled 2021-01-18: qty 30, 15d supply, fill #1
  Filled 2021-02-27: qty 30, 15d supply, fill #2

## 2020-12-19 MED FILL — Hydrochlorothiazide Cap 12.5 MG: ORAL | 30 days supply | Qty: 30 | Fill #1 | Status: AC

## 2020-12-19 MED FILL — Omeprazole Cap Delayed Release 20 MG: ORAL | 30 days supply | Qty: 30 | Fill #1 | Status: AC

## 2020-12-19 MED FILL — Fenofibrate Tab 145 MG: ORAL | 30 days supply | Qty: 30 | Fill #1 | Status: AC

## 2020-12-19 MED FILL — Lisinopril Tab 2.5 MG: ORAL | 30 days supply | Qty: 30 | Fill #1 | Status: AC

## 2020-12-19 MED FILL — Rosuvastatin Calcium Tab 20 MG: ORAL | 30 days supply | Qty: 30 | Fill #1 | Status: AC

## 2020-12-19 MED FILL — Metformin HCl Tab 1000 MG: ORAL | 30 days supply | Qty: 60 | Fill #1 | Status: AC

## 2020-12-20 ENCOUNTER — Other Ambulatory Visit: Payer: Self-pay

## 2021-01-18 ENCOUNTER — Other Ambulatory Visit: Payer: Self-pay | Admitting: Family Medicine

## 2021-01-18 ENCOUNTER — Other Ambulatory Visit: Payer: Self-pay

## 2021-01-18 DIAGNOSIS — E781 Pure hyperglyceridemia: Secondary | ICD-10-CM

## 2021-01-18 MED ORDER — ROSUVASTATIN CALCIUM 20 MG PO TABS
ORAL_TABLET | Freq: Every day | ORAL | 3 refills | Status: DC
  Filled 2021-01-18: qty 30, 30d supply, fill #0
  Filled 2021-02-27: qty 30, 30d supply, fill #1
  Filled 2021-03-29: qty 30, 30d supply, fill #2
  Filled 2021-05-02: qty 30, 30d supply, fill #3

## 2021-01-18 MED FILL — Fenofibrate Tab 145 MG: ORAL | 30 days supply | Qty: 30 | Fill #2 | Status: AC

## 2021-01-18 MED FILL — Metformin HCl Tab 1000 MG: ORAL | 30 days supply | Qty: 60 | Fill #2 | Status: AC

## 2021-01-18 MED FILL — Lisinopril Tab 2.5 MG: ORAL | 30 days supply | Qty: 30 | Fill #2 | Status: AC

## 2021-01-18 MED FILL — Hydrochlorothiazide Cap 12.5 MG: ORAL | 30 days supply | Qty: 30 | Fill #2 | Status: AC

## 2021-01-18 MED FILL — Omeprazole Cap Delayed Release 20 MG: ORAL | 30 days supply | Qty: 30 | Fill #2 | Status: AC

## 2021-01-24 ENCOUNTER — Other Ambulatory Visit: Payer: Self-pay

## 2021-02-12 ENCOUNTER — Ambulatory Visit: Payer: Self-pay | Admitting: Family Medicine

## 2021-02-27 ENCOUNTER — Other Ambulatory Visit: Payer: Self-pay

## 2021-02-27 ENCOUNTER — Other Ambulatory Visit: Payer: Self-pay | Admitting: Family Medicine

## 2021-02-27 DIAGNOSIS — E1149 Type 2 diabetes mellitus with other diabetic neurological complication: Secondary | ICD-10-CM

## 2021-02-27 MED ORDER — METFORMIN HCL 1000 MG PO TABS
ORAL_TABLET | Freq: Two times a day (BID) | ORAL | 2 refills | Status: DC
  Filled 2021-02-27: qty 60, 30d supply, fill #0
  Filled 2021-03-29: qty 60, 30d supply, fill #1
  Filled 2021-05-02: qty 60, 30d supply, fill #2

## 2021-02-27 MED FILL — Lisinopril Tab 2.5 MG: ORAL | 30 days supply | Qty: 30 | Fill #3 | Status: AC

## 2021-02-27 MED FILL — Hydrochlorothiazide Cap 12.5 MG: ORAL | 30 days supply | Qty: 30 | Fill #3 | Status: AC

## 2021-02-27 MED FILL — Fenofibrate Tab 145 MG: ORAL | 30 days supply | Qty: 30 | Fill #3 | Status: AC

## 2021-02-27 MED FILL — Omeprazole Cap Delayed Release 20 MG: ORAL | 30 days supply | Qty: 30 | Fill #3 | Status: AC

## 2021-03-29 ENCOUNTER — Other Ambulatory Visit: Payer: Self-pay

## 2021-03-29 ENCOUNTER — Other Ambulatory Visit: Payer: Self-pay | Admitting: Family Medicine

## 2021-03-29 DIAGNOSIS — K219 Gastro-esophageal reflux disease without esophagitis: Secondary | ICD-10-CM

## 2021-03-29 DIAGNOSIS — R42 Dizziness and giddiness: Secondary | ICD-10-CM

## 2021-03-29 DIAGNOSIS — I10 Essential (primary) hypertension: Secondary | ICD-10-CM

## 2021-03-29 MED ORDER — OMEPRAZOLE 20 MG PO CPDR
DELAYED_RELEASE_CAPSULE | Freq: Every day | ORAL | 5 refills | Status: DC
  Filled 2021-03-29: qty 30, 30d supply, fill #0
  Filled 2021-05-02: qty 30, 30d supply, fill #1
  Filled 2021-06-04: qty 30, 30d supply, fill #2
  Filled 2021-07-08: qty 30, 30d supply, fill #3
  Filled 2021-08-12: qty 30, 30d supply, fill #0
  Filled 2021-09-06: qty 30, 30d supply, fill #1

## 2021-03-29 MED ORDER — MECLIZINE HCL 25 MG PO TABS
ORAL_TABLET | ORAL | 2 refills | Status: DC
  Filled 2021-03-29: qty 30, 15d supply, fill #0
  Filled 2021-05-02: qty 30, 15d supply, fill #1

## 2021-03-29 MED ORDER — HYDROCHLOROTHIAZIDE 12.5 MG PO CAPS
ORAL_CAPSULE | Freq: Every day | ORAL | 5 refills | Status: DC
  Filled 2021-03-29: qty 30, 30d supply, fill #0
  Filled 2021-05-02 (×5): qty 30, 30d supply, fill #1
  Filled 2021-06-04: qty 30, 30d supply, fill #2
  Filled 2021-07-08: qty 30, 30d supply, fill #3
  Filled 2021-08-12: qty 30, 30d supply, fill #0
  Filled 2021-09-06: qty 30, 30d supply, fill #1

## 2021-03-29 MED FILL — Lisinopril Tab 2.5 MG: ORAL | 30 days supply | Qty: 30 | Fill #4 | Status: AC

## 2021-03-29 MED FILL — Fenofibrate Tab 145 MG: ORAL | 30 days supply | Qty: 30 | Fill #4 | Status: AC

## 2021-04-23 ENCOUNTER — Ambulatory Visit: Payer: Self-pay | Admitting: Family Medicine

## 2021-05-02 ENCOUNTER — Other Ambulatory Visit: Payer: Self-pay

## 2021-05-02 MED FILL — Fenofibrate Tab 145 MG: ORAL | 30 days supply | Qty: 30 | Fill #5 | Status: AC

## 2021-05-02 MED FILL — Lisinopril Tab 2.5 MG: ORAL | 30 days supply | Qty: 30 | Fill #5 | Status: AC

## 2021-05-06 ENCOUNTER — Other Ambulatory Visit: Payer: Self-pay

## 2021-05-21 ENCOUNTER — Encounter: Payer: Self-pay | Admitting: Family Medicine

## 2021-05-21 ENCOUNTER — Ambulatory Visit (INDEPENDENT_AMBULATORY_CARE_PROVIDER_SITE_OTHER): Payer: HRSA Program | Admitting: Family Medicine

## 2021-05-21 ENCOUNTER — Other Ambulatory Visit: Payer: Self-pay

## 2021-05-21 VITALS — BP 125/77 | HR 78 | Temp 98.4°F | Ht 69.0 in | Wt 212.0 lb

## 2021-05-21 DIAGNOSIS — I10 Essential (primary) hypertension: Secondary | ICD-10-CM

## 2021-05-21 DIAGNOSIS — R42 Dizziness and giddiness: Secondary | ICD-10-CM

## 2021-05-21 DIAGNOSIS — F17200 Nicotine dependence, unspecified, uncomplicated: Secondary | ICD-10-CM

## 2021-05-21 DIAGNOSIS — J449 Chronic obstructive pulmonary disease, unspecified: Secondary | ICD-10-CM

## 2021-05-21 DIAGNOSIS — E1149 Type 2 diabetes mellitus with other diabetic neurological complication: Secondary | ICD-10-CM

## 2021-05-21 DIAGNOSIS — E781 Pure hyperglyceridemia: Secondary | ICD-10-CM

## 2021-05-21 DIAGNOSIS — R053 Chronic cough: Secondary | ICD-10-CM

## 2021-05-21 LAB — POCT URINALYSIS DIP (CLINITEK)
Bilirubin, UA: NEGATIVE
Blood, UA: NEGATIVE
Glucose, UA: 250 mg/dL — AB
Ketones, POC UA: NEGATIVE mg/dL
Leukocytes, UA: NEGATIVE
Nitrite, UA: NEGATIVE
POC PROTEIN,UA: 30 — AB
Spec Grav, UA: 1.02 (ref 1.010–1.025)
Urobilinogen, UA: 1 E.U./dL
pH, UA: 7.5 (ref 5.0–8.0)

## 2021-05-21 LAB — POCT GLYCOSYLATED HEMOGLOBIN (HGB A1C)
HbA1c POC (<> result, manual entry): 7.8 % (ref 4.0–5.6)
HbA1c, POC (controlled diabetic range): 7.8 % — AB (ref 0.0–7.0)
HbA1c, POC (prediabetic range): 7.8 % — AB (ref 5.7–6.4)
Hemoglobin A1C: 7.8 % — AB (ref 4.0–5.6)

## 2021-05-21 MED ORDER — ROSUVASTATIN CALCIUM 20 MG PO TABS
ORAL_TABLET | Freq: Every day | ORAL | 3 refills | Status: DC
  Filled 2021-05-21: qty 30, fill #0
  Filled 2021-06-04: qty 30, 30d supply, fill #0
  Filled 2021-07-08: qty 30, 30d supply, fill #1
  Filled 2021-08-12: qty 30, 30d supply, fill #0
  Filled 2021-09-06: qty 30, 30d supply, fill #1

## 2021-05-21 MED ORDER — ALBUTEROL SULFATE HFA 108 (90 BASE) MCG/ACT IN AERS
2.0000 | INHALATION_SPRAY | Freq: Four times a day (QID) | RESPIRATORY_TRACT | 5 refills | Status: DC | PRN
  Filled 2021-05-21 – 2021-08-12 (×2): qty 8.5, 25d supply, fill #0
  Filled 2021-09-06: qty 8.5, 25d supply, fill #1
  Filled 2021-10-15: qty 8.5, 25d supply, fill #2
  Filled 2021-11-18: qty 8.5, 25d supply, fill #3
  Filled 2021-12-19: qty 8.5, 25d supply, fill #4

## 2021-05-21 MED ORDER — MECLIZINE HCL 25 MG PO TABS
ORAL_TABLET | ORAL | 2 refills | Status: DC
Start: 2021-05-21 — End: 2021-09-06
  Filled 2021-05-21: qty 30, 15d supply, fill #0
  Filled 2021-06-04: qty 30, 15d supply, fill #1
  Filled 2021-08-12: qty 30, 15d supply, fill #0

## 2021-05-21 MED ORDER — METFORMIN HCL 1000 MG PO TABS
ORAL_TABLET | Freq: Two times a day (BID) | ORAL | 2 refills | Status: DC
  Filled 2021-05-21: qty 180, fill #0
  Filled 2021-06-04: qty 60, 30d supply, fill #0
  Filled 2021-07-08: qty 60, 30d supply, fill #1
  Filled 2021-08-12: qty 60, 30d supply, fill #0
  Filled 2021-09-06: qty 60, 30d supply, fill #1
  Filled 2021-10-15: qty 60, 30d supply, fill #2
  Filled 2021-11-18: qty 60, 30d supply, fill #3
  Filled 2021-12-19: qty 60, 30d supply, fill #4
  Filled 2022-01-22: qty 120, 60d supply, fill #5

## 2021-05-21 MED ORDER — BENZONATATE 100 MG PO CAPS
100.0000 mg | ORAL_CAPSULE | Freq: Three times a day (TID) | ORAL | 0 refills | Status: DC
  Filled 2021-05-21: qty 21, 7d supply, fill #0

## 2021-05-21 NOTE — Progress Notes (Signed)
[facility]   Established Patient Office Visit  Subjective:  Patient ID: Julian Atkinson, male    DOB: 08-25-1965  Age: 55 y.o. MRN: [id number]  CC:  Chief Complaint  Patient presents with   Follow-up    No questions or concerns    HPI Julian Atkinson is a very pleasant 55 year old male with a medical history significant for type 2 diabetes mellitus, hypertension, hyperlipidemia, and tobacco dependence presents for follow-up of chronic conditions.  Patient has no new complaints on today.  He has not been exercising or following a low-fat, low carbohydrate diet.  He denies any chest pain, dizziness, heart palpitations, polyuria, polydipsia, or polyphagia.  He has been taking all medications consistently.  Past Medical History:  Diagnosis Date   Diabetes mellitus without complication (HCC)    GERD (gastroesophageal reflux disease)    Hypertension    Migraines    Renal disorder    Immunization History  Administered Date(s) Administered   PFIZER(Purple Top)SARS-COV-2 Vaccination 03/21/2020, 04/04/2020   Pneumococcal Polysaccharide-23 08/14/2020   Tdap 09/02/2017    No past surgical history on file.  No family history on file.  Social History   Socioeconomic History   Marital status: Divorced    Spouse name: Not on file   Number of children: Not on file   Years of education: Not on file   Highest education level: Not on file  Occupational History   Not on file  Tobacco Use   Smoking status: Every Day    Packs/day: 1.50    Years: 30.00    Pack years: 45.00    Types: Cigarettes   Smokeless tobacco: Never  Vaping Use   Vaping Use: Never used  Substance and Sexual Activity   Alcohol use: No   Drug use: Yes    Types: Marijuana    Comment: one or twice a week   Sexual activity: Not on file  Other Topics Concern   Not on file  Social History Narrative   Not on file   Social Determinants of Health   Financial  Resource Strain: Not on file  Food Insecurity: Not on file  Transportation Needs: Not on file  Physical Activity: Not on file  Stress: Not on file  Social Connections: Not on file  Intimate Partner Violence: Not on file    Outpatient Medications Prior to Visit  Medication Sig Dispense Refill   aspirin EC 81 MG tablet Take 1 tablet (81 mg total) by mouth daily. 90 tablet 3   fenofibrate (TRICOR) 145 MG tablet TAKE 1 TABLET (145 MG TOTAL) BY MOUTH DAILY. 30 tablet 11   hydrochlorothiazide (MICROZIDE) 12.5 MG capsule TAKE 1 CAPSULE BY MOUTH DAILY. 30 capsule 5   lisinopril (ZESTRIL) 2.5 MG tablet TAKE 1 TABLET (2.5 MG TOTAL) BY MOUTH DAILY. 30 tablet 11   omeprazole (PRILOSEC) 20 MG capsule TAKE 1 CAPSULE (20 MG TOTAL) BY MOUTH DAILY. 30 capsule 5   sodium chloride (OCEAN) 0.65 % SOLN nasal spray Place 1 spray into both nostrils as needed for congestion. 44 mL 0   albuterol (VENTOLIN HFA) 108 (90 Base) MCG/ACT inhaler Inhale 2 puffs into the lungs every 6 (six) hours as needed for wheezing or shortness of breath. 8.5 g 5   benzonatate (TESSALON) 100 MG capsule Take 1 capsule (100 mg total) by mouth every 8 (eight) hours. 21 capsule 0   meclizine (ANTIVERT) 25 MG tablet TAKE 1 TABLET BY MOUTH TWICE DAILY  AS NEEDED FOR DIZZINESS. 30 tablet 2   metFORMIN (GLUCOPHAGE) 1000 MG tablet TAKE 1 TABLET BY MOUTH 2 TIMES DAILY WITH A MEAL. 180 tablet 2   rosuvastatin (CRESTOR) 20 MG tablet TAKE 1 TABLET (20 MG TOTAL) BY MOUTH DAILY. 30 tablet 3   azithromycin (ZITHROMAX) 250 MG tablet TAKE 2 TABLETS BY MOUTH ON DAY 1 THEN TAKE 1 TABLET DAILY FOR THE NEXT 4 DAYS 6 tablet 0   doxycycline (VIBRAMYCIN) 100 MG capsule Take 1 capsule (100 mg total) by mouth 2 (two) times daily. 20 capsule 0   No facility-administered medications prior to visit.    Allergies  Allergen Reactions   Codeine Nausea And Vomiting   Penicillins Other (See Comments)    Childhood reaction    ROS Review of Systems   Constitutional: Negative.   HENT: Negative.    Respiratory: Negative.    Cardiovascular: Negative.   Gastrointestinal: Negative.   Endocrine: Negative.  Negative for polydipsia, polyphagia and polyuria.  Genitourinary: Negative.   Musculoskeletal: Negative.   Skin: Negative.   Neurological: Negative.   Hematological: Negative.   Psychiatric/Behavioral: Negative.       Objective:    Physical Exam Constitutional:      Appearance: Normal appearance.  Eyes:     Pupils: Pupils are equal, round, and reactive to light.  Cardiovascular:     Rate and Rhythm: Normal rate and regular rhythm.  Pulmonary:     Effort: Pulmonary effort is normal.  Abdominal:     General: Abdomen is flat.  Musculoskeletal:        General: Normal range of motion.  Skin:    General: Skin is warm.  Neurological:     General: No focal deficit present.     Mental Status: He is alert. Mental status is at baseline.  Psychiatric:        Mood and Affect: Mood normal.        Behavior: Behavior normal.        Thought Content: Thought content normal.        Judgment: Judgment normal.    BP 125/77   Pulse 78   Temp 98.4 F (36.9 C)   Ht 5\' 9"  (1.753 m)   Wt 212 lb 0.4 oz (96.2 kg)   SpO2 98%   BMI 31.31 kg/m  Wt Readings from Last 3 Encounters:  05/21/21 212 lb 0.4 oz (96.2 kg)  08/14/20 217 lb 12.8 oz (98.8 kg)  12/20/19 206 lb (93.4 kg)     Health Maintenance Due  Topic Date Due   COLONOSCOPY (Pts 45-61yrs Insurance coverage will need to be confirmed)  Never done   Zoster Vaccines- Shingrix (1 of 2) Never done   OPHTHALMOLOGY EXAM  12/24/2018   COVID-19 Vaccine (3 - Booster) 05/30/2020   Pneumococcal Vaccine 93-87 Years old (2 - PCV) 08/14/2021    There are no preventive care reminders to display for this patient.  No results found for: TSH Lab Results  Component Value Date   WBC 8.7 03/03/2018   HGB 16.4 03/03/2018   HCT 47.7 03/03/2018   MCV 90 03/03/2018   PLT 198 04/30/2017    Lab Results  Component Value Date   NA 138 08/14/2020   K 4.2 08/14/2020   CO2 23 08/14/2020   GLUCOSE 118 (H) 08/14/2020   BUN 19 08/14/2020   CREATININE 0.83 08/14/2020   BILITOT 0.5 08/14/2020   ALKPHOS 53 08/14/2020   AST 13 08/14/2020   ALT 18 08/14/2020  PROT 7.1 08/14/2020   ALBUMIN 4.5 08/14/2020   CALCIUM 9.4 08/14/2020   ANIONGAP 9 10/26/2016   Lab Results  Component Value Date   CHOL 117 12/20/2019   Lab Results  Component Value Date   HDL 30 (L) 12/20/2019   Lab Results  Component Value Date   LDLCALC 66 12/20/2019   Lab Results  Component Value Date   TRIG 114 12/20/2019   Lab Results  Component Value Date   CHOLHDL 3.9 12/20/2019   Lab Results  Component Value Date   HGBA1C 7.8 (A) 05/21/2021   HGBA1C 7.8 05/21/2021   HGBA1C 7.8 (A) 05/21/2021   HGBA1C 7.8 (A) 05/21/2021      Assessment & Plan:   Problem List Items Addressed This Visit       Respiratory   Chronic obstructive pulmonary disease (HCC)   Relevant Medications   albuterol (VENTOLIN HFA) 108 (90 Base) MCG/ACT inhaler   benzonatate (TESSALON) 100 MG capsule     Endocrine   Diabetes mellitus (HCC) - Primary   Relevant Medications   metFORMIN (GLUCOPHAGE) 1000 MG tablet   rosuvastatin (CRESTOR) 20 MG tablet   Other Relevant Orders   HgB A1c (Completed)   POCT URINALYSIS DIP (CLINITEK) (Completed)     Other   Vertigo   Relevant Medications   meclizine (ANTIVERT) 25 MG tablet   Other Visit Diagnoses     Essential hypertension       Relevant Medications   rosuvastatin (CRESTOR) 20 MG tablet   Other Relevant Orders   POCT URINALYSIS DIP (CLINITEK) (Completed)   Hypertriglyceridemia       Relevant Medications   rosuvastatin (CRESTOR) 20 MG tablet   Persistent cough for 3 weeks or longer       Relevant Medications   benzonatate (TESSALON) 100 MG capsule       Meds ordered this encounter  Medications   albuterol (VENTOLIN HFA) 108 (90 Base) MCG/ACT inhaler     Sig: Inhale 2 puffs into the lungs every 6 (six) hours as needed for wheezing or shortness of breath.    Dispense:  8.5 g    Refill:  5   meclizine (ANTIVERT) 25 MG tablet    Sig: TAKE 1 TABLET BY MOUTH TWICE DAILY AS NEEDED FOR DIZZINESS.    Dispense:  30 tablet    Refill:  2   metFORMIN (GLUCOPHAGE) 1000 MG tablet    Sig: TAKE 1 TABLET BY MOUTH 2 TIMES DAILY WITH A MEAL.    Dispense:  180 tablet    Refill:  2   rosuvastatin (CRESTOR) 20 MG tablet    Sig: TAKE 1 TABLET (20 MG TOTAL) BY MOUTH DAILY.    Dispense:  30 tablet    Refill:  3   benzonatate (TESSALON) 100 MG capsule    Sig: Take 1 capsule (100 mg total) by mouth every 8 (eight) hours.    Dispense:  21 capsule    Refill:  0    Follow-up: Return in about 3 months (around 08/21/2021) for diabetes, hypertension, hyperlipidemia.    [name]  APRN, MSN, FNP-C Patient Care [facility] [address] [place], [place] [address] [phone]

## 2021-05-22 ENCOUNTER — Telehealth: Payer: Self-pay | Admitting: Family Medicine

## 2021-05-22 LAB — COMPREHENSIVE METABOLIC PANEL
ALT: 14 IU/L (ref 0–44)
AST: 11 IU/L (ref 0–40)
Albumin/Globulin Ratio: 1.7 (ref 1.2–2.2)
Albumin: 4.7 g/dL (ref 3.8–4.9)
Alkaline Phosphatase: 58 IU/L (ref 44–121)
BUN/Creatinine Ratio: 21 — ABNORMAL HIGH (ref 9–20)
BUN: 16 mg/dL (ref 6–24)
Bilirubin Total: 0.4 mg/dL (ref 0.0–1.2)
CO2: 24 mmol/L (ref 20–29)
Calcium: 9.3 mg/dL (ref 8.7–10.2)
Chloride: 98 mmol/L (ref 96–106)
Creatinine, Ser: 0.75 mg/dL — ABNORMAL LOW (ref 0.76–1.27)
Globulin, Total: 2.7 g/dL (ref 1.5–4.5)
Glucose: 160 mg/dL — ABNORMAL HIGH (ref 70–99)
Potassium: 4.2 mmol/L (ref 3.5–5.2)
Sodium: 137 mmol/L (ref 134–144)
Total Protein: 7.4 g/dL (ref 6.0–8.5)
eGFR: 106 mL/min/{1.73_m2} (ref >59)

## 2021-05-22 LAB — LIPID PANEL
Chol/HDL Ratio: 4.8 ratio (ref 0.0–5.0)
Cholesterol, Total: 149 mg/dL (ref 100–199)
HDL: 31 mg/dL — ABNORMAL LOW (ref >39)
LDL Chol Calc (NIH): 92 mg/dL (ref 0–99)
Triglycerides: 149 mg/dL (ref 0–149)
VLDL Cholesterol Cal: 26 mg/dL (ref 5–40)

## 2021-05-22 NOTE — Telephone Encounter (Signed)
Julian Atkinson is a 55 year old male with a medical history significant for sickle cell disease, hypertension, and hyperlipidemia presented for 110-month follow-up of chronic conditions.  Patient states that he has not been following carbohydrate modified diet.  Reviewed all laboratory values, largely within normal range.  Hemoglobin A1c has increased, recommend that patient follows a carbohydrate modified diet as discussed during visit.  Will recheck hemoglobin A1c in 3 months.  [name]  APRN, MSN, FNP-C Patient Care [facility] [address] [place], [place] [address] [phone]

## 2021-05-24 ENCOUNTER — Other Ambulatory Visit: Payer: Self-pay

## 2021-05-27 ENCOUNTER — Encounter: Payer: Self-pay | Admitting: Family Medicine

## 2021-06-04 ENCOUNTER — Other Ambulatory Visit: Payer: Self-pay

## 2021-06-04 MED FILL — Fenofibrate Tab 145 MG: ORAL | 30 days supply | Qty: 30 | Fill #6 | Status: AC

## 2021-06-04 MED FILL — Lisinopril Tab 2.5 MG: ORAL | 30 days supply | Qty: 30 | Fill #6 | Status: AC

## 2021-06-07 ENCOUNTER — Other Ambulatory Visit: Payer: Self-pay

## 2021-07-08 ENCOUNTER — Other Ambulatory Visit: Payer: Self-pay

## 2021-07-08 MED FILL — Fenofibrate Tab 145 MG: ORAL | 30 days supply | Qty: 30 | Fill #7 | Status: AC

## 2021-07-08 MED FILL — Lisinopril Tab 2.5 MG: ORAL | 30 days supply | Qty: 30 | Fill #7 | Status: AC

## 2021-07-12 ENCOUNTER — Other Ambulatory Visit: Payer: Self-pay

## 2021-08-12 ENCOUNTER — Other Ambulatory Visit: Payer: Self-pay

## 2021-08-12 MED FILL — Fenofibrate Tab 145 MG: ORAL | 30 days supply | Qty: 30 | Fill #0 | Status: AC

## 2021-08-12 MED FILL — Lisinopril Tab 2.5 MG: ORAL | 30 days supply | Qty: 30 | Fill #0 | Status: AC

## 2021-08-27 ENCOUNTER — Ambulatory Visit: Payer: Self-pay | Admitting: Family Medicine

## 2021-09-06 ENCOUNTER — Other Ambulatory Visit: Payer: Self-pay | Admitting: Family Medicine

## 2021-09-06 ENCOUNTER — Other Ambulatory Visit: Payer: Self-pay

## 2021-09-06 DIAGNOSIS — R42 Dizziness and giddiness: Secondary | ICD-10-CM

## 2021-09-06 MED FILL — Fenofibrate Tab 145 MG: ORAL | 30 days supply | Qty: 30 | Fill #1 | Status: AC

## 2021-09-06 MED FILL — Lisinopril Tab 2.5 MG: ORAL | 30 days supply | Qty: 30 | Fill #1 | Status: AC

## 2021-09-07 MED ORDER — MECLIZINE HCL 25 MG PO TABS
ORAL_TABLET | ORAL | 2 refills | Status: DC
  Filled 2021-09-07: qty 30, 15d supply, fill #0
  Filled 2021-09-30: qty 30, 30d supply, fill #0
  Filled 2021-12-03: qty 30, 30d supply, fill #1
  Filled 2022-01-24: qty 30, 30d supply, fill #2

## 2021-09-09 ENCOUNTER — Other Ambulatory Visit: Payer: Self-pay

## 2021-09-16 ENCOUNTER — Other Ambulatory Visit: Payer: Self-pay

## 2021-09-30 ENCOUNTER — Other Ambulatory Visit: Payer: Self-pay

## 2021-09-30 MED ORDER — CLINDAMYCIN HCL 300 MG PO CAPS
ORAL_CAPSULE | ORAL | 0 refills | Status: DC
  Filled 2021-09-30: qty 30, 7d supply, fill #0

## 2021-09-30 MED ORDER — IBUPROFEN 800 MG PO TABS
ORAL_TABLET | ORAL | 0 refills | Status: AC
  Filled 2021-09-30: qty 30, 7d supply, fill #0

## 2021-10-08 ENCOUNTER — Ambulatory Visit (INDEPENDENT_AMBULATORY_CARE_PROVIDER_SITE_OTHER): Payer: Self-pay | Admitting: Family Medicine

## 2021-10-08 ENCOUNTER — Other Ambulatory Visit: Payer: Self-pay

## 2021-10-08 ENCOUNTER — Encounter: Payer: Self-pay | Admitting: Family Medicine

## 2021-10-08 VITALS — BP 116/81 | HR 73 | Temp 98.6°F | Ht 69.0 in | Wt 213.8 lb

## 2021-10-08 DIAGNOSIS — I10 Essential (primary) hypertension: Secondary | ICD-10-CM

## 2021-10-08 DIAGNOSIS — E781 Pure hyperglyceridemia: Secondary | ICD-10-CM

## 2021-10-08 DIAGNOSIS — F17200 Nicotine dependence, unspecified, uncomplicated: Secondary | ICD-10-CM

## 2021-10-08 DIAGNOSIS — E1149 Type 2 diabetes mellitus with other diabetic neurological complication: Secondary | ICD-10-CM

## 2021-10-08 LAB — POCT URINALYSIS DIP (CLINITEK)
Bilirubin, UA: NEGATIVE
Blood, UA: NEGATIVE
Glucose, UA: 250 mg/dL — AB
Ketones, POC UA: NEGATIVE mg/dL
Leukocytes, UA: NEGATIVE
Nitrite, UA: NEGATIVE
Spec Grav, UA: >=1.03 — AB (ref 1.010–1.025)
Urobilinogen, UA: 1 E.U./dL
pH, UA: 6.5 (ref 5.0–8.0)

## 2021-10-08 NOTE — Progress Notes (Signed)
[facility]   Established Patient Office Visit  Subjective:  Patient ID: Julian Atkinson, male    DOB: 16-Oct-1965  Age: 56 y.o. MRN: [id number]  CC:  Chief Complaint  Patient presents with   Follow-up    Patient is here today for his 3 month follow up visit with no concerns or issues to discuss.    HPI Julian Atkinson is a very pleasant 56 year old male that presents for follow-up of his chronic conditions.  He has a medical history that significant for type 2 diabetes mellitus, hypertension, hyperlipidemia, obesity, and tobacco dependence.  Patient states that he recently had all of his teeth pulled and dentures placed.  He is under the care of his dentist at this time.  He continues to smoke 1 pack of cigarettes per day and is not interested in quitting at this time.  He has attempted smoking cessation in the past without very much success.  Diabetes He presents for his follow-up diabetic visit. He has type 2 diabetes mellitus. His disease course has been stable. Pertinent negatives for diabetes include no blurred vision, no chest pain, no fatigue, no polydipsia, no polyphagia, no polyuria, no weakness and no weight loss. Risk factors for coronary artery disease include male sex, obesity and tobacco exposure. He is compliant with treatment all of the time. He does not see a podiatrist.Eye exam is current.  Hypertension This is a chronic problem. The problem is controlled. Pertinent negatives include no blurred vision or chest pain. Agents associated with hypertension include NSAIDs. Risk factors for coronary artery disease include obesity. There is no history of kidney disease or heart failure. There is no history of chronic renal disease or a hypertension causing med.    Past Medical History:  Diagnosis Date   Diabetes mellitus without complication (HCC)    GERD (gastroesophageal reflux disease)    Hypertension    Migraines    Renal  disorder     History reviewed. No pertinent surgical history.  Family History  Family history unknown: Yes    Social History   Socioeconomic History   Marital status: Divorced    Spouse name: Not on file   Number of children: Not on file   Years of education: Not on file   Highest education level: Not on file  Occupational History   Not on file  Tobacco Use   Smoking status: Every Day    Packs/day: 1.50    Years: 30.00    Pack years: 45.00    Types: Cigarettes   Smokeless tobacco: Never  Vaping Use   Vaping Use: Never used  Substance and Sexual Activity   Alcohol use: Yes    Comment: occ   Drug use: Not Currently    Types: Marijuana    Comment: one or twice a week   Sexual activity: Not Currently  Other Topics Concern   Not on file  Social History Narrative   Not on file   Social Determinants of Health   Financial Resource Strain: Not on file  Food Insecurity: Not on file  Transportation Needs: Not on file  Physical Activity: Not on file  Stress: Not on file  Social Connections: Not on file  Intimate Partner Violence: Not on file    Outpatient Medications Prior to Visit  Medication Sig Dispense Refill   albuterol (VENTOLIN HFA) 108 (90 Base) MCG/ACT inhaler Inhale 2 puffs into the lungs every 6 (six) hours as needed for wheezing  or shortness of breath. 8.5 g 5   aspirin EC 81 MG tablet Take 1 tablet (81 mg total) by mouth daily. 90 tablet 3   clindamycin (CLEOCIN) 300 MG capsule TAKE 1 CAPSULE BY MOUTH EVERY 6-8 HOURS UNTIL FINISHED 30 capsule 0   fenofibrate (TRICOR) 145 MG tablet TAKE 1 TABLET (145 MG TOTAL) BY MOUTH DAILY. 30 tablet 11   hydrochlorothiazide (MICROZIDE) 12.5 MG capsule TAKE 1 CAPSULE BY MOUTH DAILY. 30 capsule 5   ibuprofen (ADVIL) 800 MG tablet TAKE 1 TABLET BY MOUTH EVERY 6 HOURS; DO NOT EXCEED 3200 MG PER DAY 30 tablet 0   lisinopril (ZESTRIL) 2.5 MG tablet TAKE 1 TABLET (2.5 MG TOTAL) BY MOUTH DAILY. 30 tablet 11   magic mouthwash  SOLN      meclizine (ANTIVERT) 25 MG tablet TAKE 1 TABLET BY MOUTH TWICE DAILY AS NEEDED FOR DIZZINESS. 30 tablet 2   metFORMIN (GLUCOPHAGE) 1000 MG tablet TAKE 1 TABLET BY MOUTH 2 TIMES DAILY WITH A MEAL. 180 tablet 2   omeprazole (PRILOSEC) 20 MG capsule TAKE 1 CAPSULE (20 MG TOTAL) BY MOUTH DAILY. 30 capsule 5   rosuvastatin (CRESTOR) 20 MG tablet TAKE 1 TABLET (20 MG TOTAL) BY MOUTH DAILY. 30 tablet 3   sodium chloride (OCEAN) 0.65 % SOLN nasal spray Place 1 spray into both nostrils as needed for congestion. 44 mL 0   benzonatate (TESSALON) 100 MG capsule Take 1 capsule (100 mg total) by mouth every 8 (eight) hours. (Patient not taking: Reported on 10/08/2021) 21 capsule 0   No facility-administered medications prior to visit.    Allergies  Allergen Reactions   Codeine Nausea And Vomiting   Penicillins Other (See Comments)    Childhood reaction    ROS Review of Systems  Constitutional:  Negative for fatigue, fever and weight loss.  HENT: Negative.    Eyes: Negative.  Negative for blurred vision.  Respiratory: Negative.    Cardiovascular:  Negative for chest pain.  Gastrointestinal: Negative.   Endocrine: Negative for polydipsia, polyphagia and polyuria.  Musculoskeletal: Negative.   Skin: Negative.   Neurological: Negative.  Negative for weakness.  Psychiatric/Behavioral: Negative.       Objective:    Physical Exam Constitutional:      Appearance: He is obese.  Eyes:     Pupils: Pupils are equal, round, and reactive to light.  Cardiovascular:     Rate and Rhythm: Normal rate and regular rhythm.  Pulmonary:     Effort: Pulmonary effort is normal.  Abdominal:     General: Bowel sounds are normal.  Musculoskeletal:        General: Normal range of motion.  Skin:    General: Skin is warm.  Neurological:     General: No focal deficit present.     Mental Status: He is alert and oriented to person, place, and time. Mental status is at baseline.  Psychiatric:         Mood and Affect: Mood normal.        Behavior: Behavior normal.        Thought Content: Thought content normal.        Judgment: Judgment normal.    BP 116/81   Pulse 73   Temp 98.6 F (37 C)   Ht 5\' 9"  (1.753 m)   Wt 213 lb 12.8 oz (97 kg)   SpO2 97%   BMI 31.57 kg/m  Wt Readings from Last 3 Encounters:  10/08/21 213 lb 12.8 oz (97 kg)  05/21/21 212 lb 0.4 oz (96.2 kg)  08/14/20 217 lb 12.8 oz (98.8 kg)     Health Maintenance Due  Topic Date Due   COLONOSCOPY (Pts 45-30yrs Insurance coverage will need to be confirmed)  Never done   Zoster Vaccines- Shingrix (1 of 2) Never done   OPHTHALMOLOGY EXAM  12/24/2018   COVID-19 Vaccine (3 - Booster) 05/30/2020    There are no preventive care reminders to display for this patient.  No results found for: TSH Lab Results  Component Value Date   WBC 8.7 03/03/2018   HGB 16.4 03/03/2018   HCT 47.7 03/03/2018   MCV 90 03/03/2018   PLT 198 04/30/2017   Lab Results  Component Value Date   NA 137 05/21/2021   K 4.2 05/21/2021   CO2 24 05/21/2021   GLUCOSE 160 (H) 05/21/2021   BUN 16 05/21/2021   CREATININE 0.75 (L) 05/21/2021   BILITOT 0.4 05/21/2021   ALKPHOS 58 05/21/2021   AST 11 05/21/2021   ALT 14 05/21/2021   PROT 7.4 05/21/2021   ALBUMIN 4.7 05/21/2021   CALCIUM 9.3 05/21/2021   ANIONGAP 9 10/26/2016   EGFR 106 05/21/2021   Lab Results  Component Value Date   CHOL 149 05/21/2021   Lab Results  Component Value Date   HDL 31 (L) 05/21/2021   Lab Results  Component Value Date   LDLCALC 92 05/21/2021   Lab Results  Component Value Date   TRIG 149 05/21/2021   Lab Results  Component Value Date   CHOLHDL 4.8 05/21/2021   Lab Results  Component Value Date   HGBA1C 7.8 (A) 05/21/2021   HGBA1C 7.8 05/21/2021   HGBA1C 7.8 (A) 05/21/2021   HGBA1C 7.8 (A) 05/21/2021      Assessment & Plan:   Problem List Items Addressed This Visit       Endocrine   Diabetes mellitus (HCC) - Primary   Relevant  Orders   POCT URINALYSIS DIP (CLINITEK)   Hemoglobin A1c   Comprehensive metabolic panel   Other Visit Diagnoses     Essential hypertension       Relevant Orders   POCT URINALYSIS DIP (CLINITEK)   Comprehensive metabolic panel      1. Type 2 diabetes mellitus with other neurologic complication, without long-term current use of insulin (HCC)  - POCT URINALYSIS DIP (CLINITEK) - Hemoglobin A1c - Comprehensive metabolic panel  2. Essential hypertension BP 116/81   Pulse 73   Temp 98.6 F (37 C)   Ht 5\' 9"  (1.753 m)   Wt 213 lb 12.8 oz (97 kg)   SpO2 97%   BMI 31.57 kg/m  - Continue medication, monitor blood pressure at home. Continue DASH diet.  Reminder to go to the ER if any CP, SOB, nausea, dizziness, severe HA, changes vision/speech, left arm numbness and tingling and jaw pain.   - POCT URINALYSIS DIP (CLINITEK) - Comprehensive metabolic panel  3. Tobacco dependence Smoking cessation instruction/counseling given:  counseled patient on the dangers of tobacco use, advised patient to stop smoking, and reviewed strategies to maximize success   4. Hypertriglyceridemia The 10-year ASCVD risk score (Arnett DK, et al., 2019) is: 22.5%   Values used to calculate the score:     Age: 10 years     Sex: Male     Is Non-Hispanic African American: No     Diabetic: Yes     Tobacco smoker: Yes     Systolic Blood Pressure: 116 mmHg  Is BP treated: Yes     HDL Cholesterol: 31 mg/dL     Total Cholesterol: 149 mg/dL     Follow-up: Return in about 6 months (around 04/10/2022) for diabetes, hypertension.   [name]  APRN, MSN, FNP-C [facility] [address] [place], [place] [address] [phone]

## 2021-10-09 LAB — COMPREHENSIVE METABOLIC PANEL
ALT: 14 IU/L (ref 0–44)
AST: 11 IU/L (ref 0–40)
Albumin/Globulin Ratio: 1.8 (ref 1.2–2.2)
Albumin: 4.4 g/dL (ref 3.8–4.9)
Alkaline Phosphatase: 50 IU/L (ref 44–121)
BUN/Creatinine Ratio: 15 (ref 9–20)
BUN: 13 mg/dL (ref 6–24)
Bilirubin Total: 0.5 mg/dL (ref 0.0–1.2)
CO2: 24 mmol/L (ref 20–29)
Calcium: 9.6 mg/dL (ref 8.7–10.2)
Chloride: 102 mmol/L (ref 96–106)
Creatinine, Ser: 0.86 mg/dL (ref 0.76–1.27)
Globulin, Total: 2.4 g/dL (ref 1.5–4.5)
Glucose: 163 mg/dL — ABNORMAL HIGH (ref 70–99)
Potassium: 4.5 mmol/L (ref 3.5–5.2)
Sodium: 142 mmol/L (ref 134–144)
Total Protein: 6.8 g/dL (ref 6.0–8.5)
eGFR: 102 mL/min/{1.73_m2} (ref >59)

## 2021-10-09 LAB — HEMOGLOBIN A1C
Est. average glucose Bld gHb Est-mCnc: 217 mg/dL
Hgb A1c MFr Bld: 9.2 % — ABNORMAL HIGH (ref 4.8–5.6)

## 2021-10-15 ENCOUNTER — Other Ambulatory Visit: Payer: Self-pay

## 2021-10-15 ENCOUNTER — Other Ambulatory Visit: Payer: Self-pay | Admitting: Family Medicine

## 2021-10-15 DIAGNOSIS — E781 Pure hyperglyceridemia: Secondary | ICD-10-CM

## 2021-10-15 DIAGNOSIS — K219 Gastro-esophageal reflux disease without esophagitis: Secondary | ICD-10-CM

## 2021-10-15 DIAGNOSIS — I10 Essential (primary) hypertension: Secondary | ICD-10-CM

## 2021-10-15 MED ORDER — FENOFIBRATE 145 MG PO TABS
ORAL_TABLET | Freq: Every day | ORAL | 11 refills | Status: DC
  Filled 2021-10-15: qty 30, 30d supply, fill #0
  Filled 2021-11-18: qty 30, 30d supply, fill #1
  Filled 2021-12-19: qty 30, 30d supply, fill #2
  Filled 2022-01-22: qty 90, 90d supply, fill #3
  Filled 2022-04-28: qty 90, 90d supply, fill #4
  Filled 2022-08-04: qty 90, 90d supply, fill #5

## 2021-10-15 MED ORDER — ROSUVASTATIN CALCIUM 20 MG PO TABS
ORAL_TABLET | Freq: Every day | ORAL | 3 refills | Status: DC
  Filled 2021-10-15: qty 30, 30d supply, fill #0
  Filled 2021-11-18: qty 30, 30d supply, fill #1
  Filled 2021-12-19: qty 30, 30d supply, fill #2
  Filled 2022-01-22: qty 30, 30d supply, fill #3

## 2021-10-15 MED ORDER — LISINOPRIL 2.5 MG PO TABS
ORAL_TABLET | Freq: Every day | ORAL | 11 refills | Status: DC
  Filled 2021-10-15: qty 30, 30d supply, fill #0
  Filled 2021-11-18: qty 30, 30d supply, fill #1
  Filled 2021-12-19: qty 30, 30d supply, fill #2
  Filled 2022-01-22: qty 90, 90d supply, fill #3
  Filled 2022-04-28 (×2): qty 90, 90d supply, fill #4
  Filled 2022-08-04: qty 90, 90d supply, fill #5

## 2021-10-15 MED ORDER — HYDROCHLOROTHIAZIDE 12.5 MG PO CAPS
ORAL_CAPSULE | Freq: Every day | ORAL | 5 refills | Status: DC
  Filled 2021-10-15: qty 30, 30d supply, fill #0
  Filled 2021-11-18: qty 30, 30d supply, fill #1
  Filled 2021-12-19: qty 30, 30d supply, fill #2
  Filled 2022-01-22: qty 30, 30d supply, fill #3
  Filled 2022-02-20: qty 30, 30d supply, fill #4
  Filled 2022-03-27: qty 30, 30d supply, fill #5

## 2021-10-15 MED ORDER — OMEPRAZOLE 20 MG PO CPDR
DELAYED_RELEASE_CAPSULE | Freq: Every day | ORAL | 5 refills | Status: DC
  Filled 2021-10-15: qty 30, 30d supply, fill #0
  Filled 2021-11-18: qty 30, 30d supply, fill #1
  Filled 2021-12-19: qty 30, 30d supply, fill #2
  Filled 2022-01-22: qty 30, 30d supply, fill #3
  Filled 2022-02-20: qty 30, 30d supply, fill #4
  Filled 2022-03-27: qty 30, 30d supply, fill #5

## 2021-10-16 ENCOUNTER — Other Ambulatory Visit: Payer: Self-pay

## 2021-11-18 ENCOUNTER — Other Ambulatory Visit: Payer: Self-pay

## 2021-11-20 ENCOUNTER — Other Ambulatory Visit: Payer: Self-pay

## 2021-11-21 ENCOUNTER — Other Ambulatory Visit: Payer: Self-pay

## 2021-12-03 ENCOUNTER — Other Ambulatory Visit: Payer: Self-pay

## 2021-12-04 ENCOUNTER — Other Ambulatory Visit: Payer: Self-pay

## 2021-12-19 ENCOUNTER — Other Ambulatory Visit: Payer: Self-pay

## 2021-12-20 ENCOUNTER — Other Ambulatory Visit: Payer: Self-pay

## 2022-01-22 ENCOUNTER — Other Ambulatory Visit: Payer: Self-pay

## 2022-01-22 ENCOUNTER — Other Ambulatory Visit: Payer: Self-pay | Admitting: Family Medicine

## 2022-01-22 DIAGNOSIS — J449 Chronic obstructive pulmonary disease, unspecified: Secondary | ICD-10-CM

## 2022-01-22 MED ORDER — ALBUTEROL SULFATE HFA 108 (90 BASE) MCG/ACT IN AERS
2.0000 | INHALATION_SPRAY | Freq: Four times a day (QID) | RESPIRATORY_TRACT | 5 refills | Status: DC | PRN
  Filled 2022-01-22: qty 8.5, 25d supply, fill #0
  Filled 2022-02-20: qty 8.5, 25d supply, fill #1
  Filled 2022-04-28: qty 6.7, 20d supply, fill #2
  Filled 2022-05-29: qty 6.7, 25d supply, fill #3
  Filled 2022-07-08 (×2): qty 6.7, 25d supply, fill #4
  Filled 2022-08-07: qty 6.7, 25d supply, fill #5

## 2022-01-23 ENCOUNTER — Other Ambulatory Visit: Payer: Self-pay

## 2022-01-24 ENCOUNTER — Other Ambulatory Visit: Payer: Self-pay

## 2022-02-20 ENCOUNTER — Other Ambulatory Visit: Payer: Self-pay

## 2022-02-20 ENCOUNTER — Other Ambulatory Visit: Payer: Self-pay | Admitting: Family Medicine

## 2022-02-20 DIAGNOSIS — E781 Pure hyperglyceridemia: Secondary | ICD-10-CM

## 2022-02-20 MED ORDER — ROSUVASTATIN CALCIUM 20 MG PO TABS
ORAL_TABLET | Freq: Every day | ORAL | 3 refills | Status: DC
  Filled 2022-02-20: qty 30, 30d supply, fill #0
  Filled 2022-03-27: qty 30, 30d supply, fill #1
  Filled 2022-04-28: qty 30, 30d supply, fill #2
  Filled 2022-05-29: qty 30, 30d supply, fill #3

## 2022-02-21 ENCOUNTER — Other Ambulatory Visit: Payer: Self-pay

## 2022-02-21 ENCOUNTER — Other Ambulatory Visit: Payer: Self-pay | Admitting: Family Medicine

## 2022-02-21 DIAGNOSIS — R42 Dizziness and giddiness: Secondary | ICD-10-CM

## 2022-02-24 ENCOUNTER — Other Ambulatory Visit: Payer: Self-pay | Admitting: Family Medicine

## 2022-02-24 ENCOUNTER — Other Ambulatory Visit: Payer: Self-pay

## 2022-02-24 ENCOUNTER — Telehealth: Payer: Self-pay

## 2022-02-24 DIAGNOSIS — R42 Dizziness and giddiness: Secondary | ICD-10-CM

## 2022-02-24 MED ORDER — MECLIZINE HCL 25 MG PO TABS
ORAL_TABLET | ORAL | 2 refills | Status: DC
  Filled 2022-02-24: qty 30, fill #0
  Filled 2022-03-27: qty 30, 15d supply, fill #0
  Filled 2022-04-28: qty 30, 15d supply, fill #1
  Filled 2022-05-29: qty 30, 15d supply, fill #2

## 2022-02-24 MED ORDER — MECLIZINE HCL 25 MG PO TABS
ORAL_TABLET | ORAL | 2 refills | Status: DC
  Filled 2022-02-24: qty 30, 15d supply, fill #0

## 2022-02-24 NOTE — Progress Notes (Signed)
Meds ordered this encounter  Medications   meclizine (ANTIVERT) 25 MG tablet    Sig: TAKE 1 TABLET BY MOUTH TWICE DAILY AS NEEDED FOR DIZZINESS.    Dispense:  30 tablet    Refill:  2    Order Specific Question:   Supervising Provider    Answer:   Quentin Angst [[id number]]   [name]  APRN, MSN, FNP-C [facility] [address] [place], [place] [address] [phone]

## 2022-02-24 NOTE — Telephone Encounter (Signed)
Dizzy med to be refilled

## 2022-02-25 ENCOUNTER — Other Ambulatory Visit: Payer: Self-pay

## 2022-03-27 ENCOUNTER — Other Ambulatory Visit: Payer: Self-pay

## 2022-03-27 ENCOUNTER — Other Ambulatory Visit: Payer: Self-pay | Admitting: Family Medicine

## 2022-03-27 DIAGNOSIS — E1149 Type 2 diabetes mellitus with other diabetic neurological complication: Secondary | ICD-10-CM

## 2022-03-27 MED ORDER — METFORMIN HCL 1000 MG PO TABS
ORAL_TABLET | Freq: Two times a day (BID) | ORAL | 2 refills | Status: DC
  Filled 2022-03-27: qty 180, 90d supply, fill #0
  Filled 2022-04-28: qty 180, 90d supply, fill #1
  Filled 2022-07-11 – 2022-08-07 (×3): qty 180, 90d supply, fill #2

## 2022-04-15 ENCOUNTER — Ambulatory Visit: Payer: Self-pay | Admitting: Family Medicine

## 2022-04-23 IMAGING — DX DG CHEST 2V
3 series · 3 of 3 positions shown · non-contrast
Comparison: 08/03/2014

CLINICAL DATA: Productive cough

EXAM:
CHEST - 2 VIEW

[chest pa]
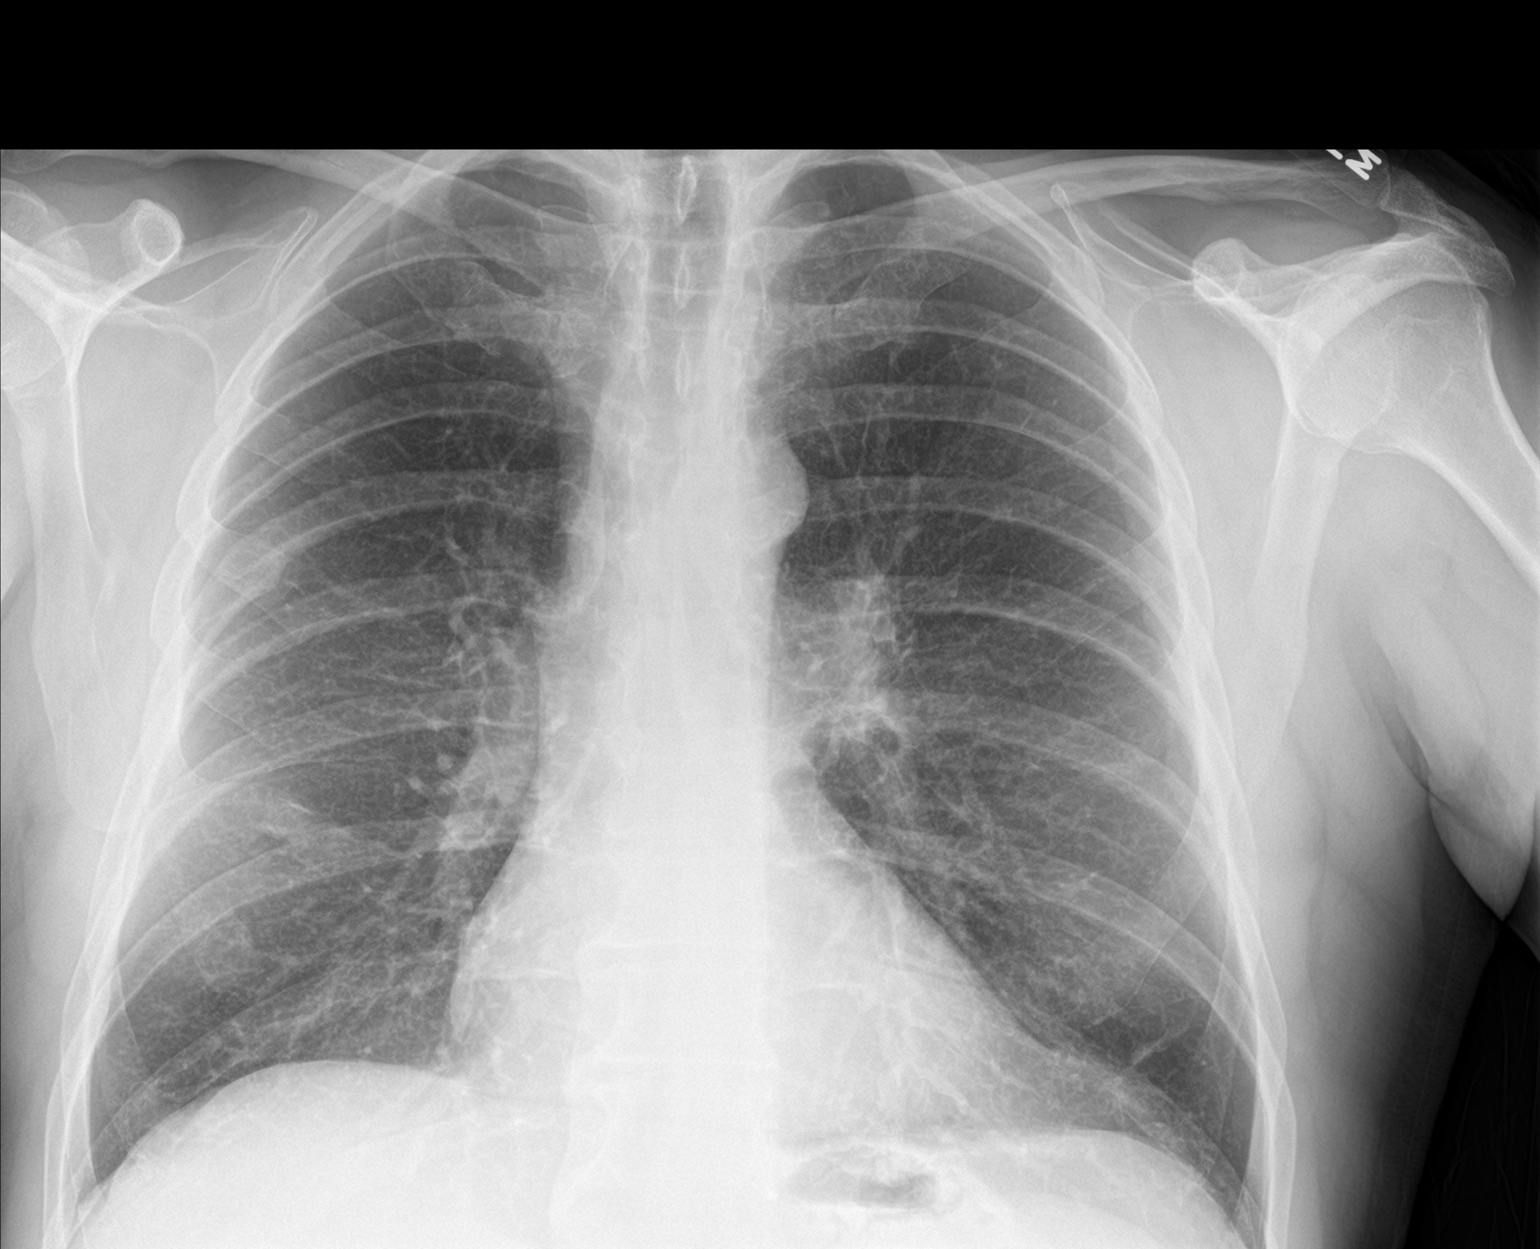

[chest lat (1 of 2)]
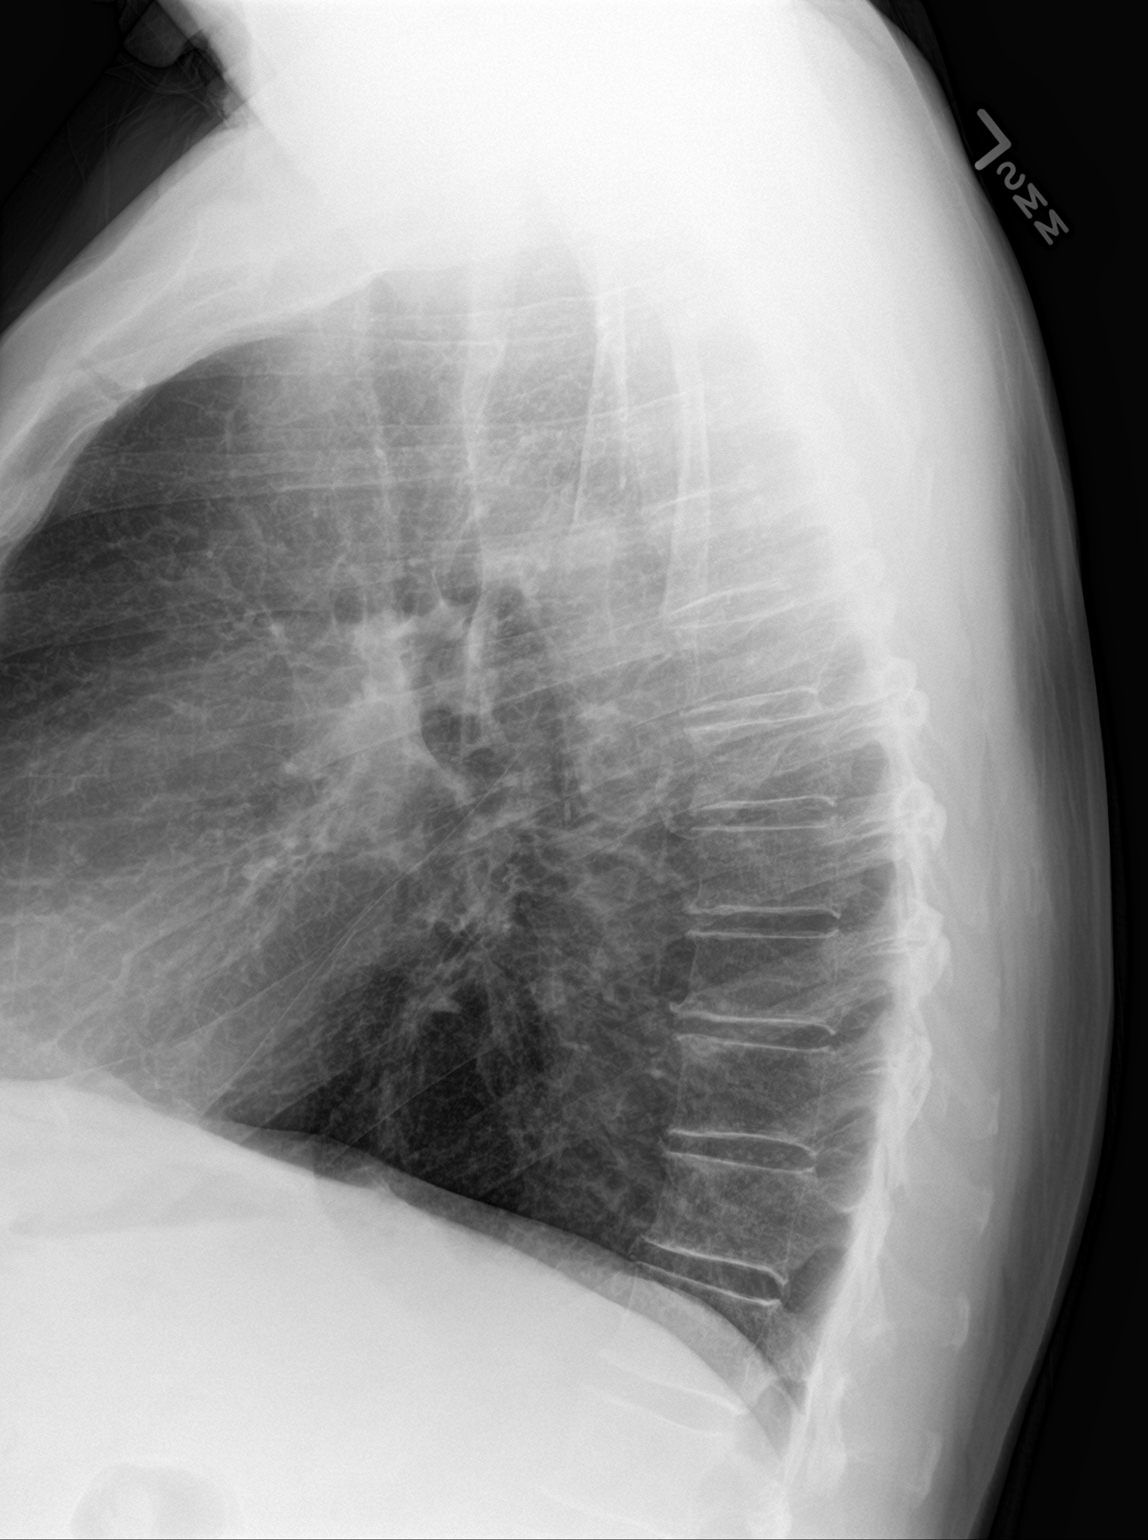

[chest lat (2 of 2)]
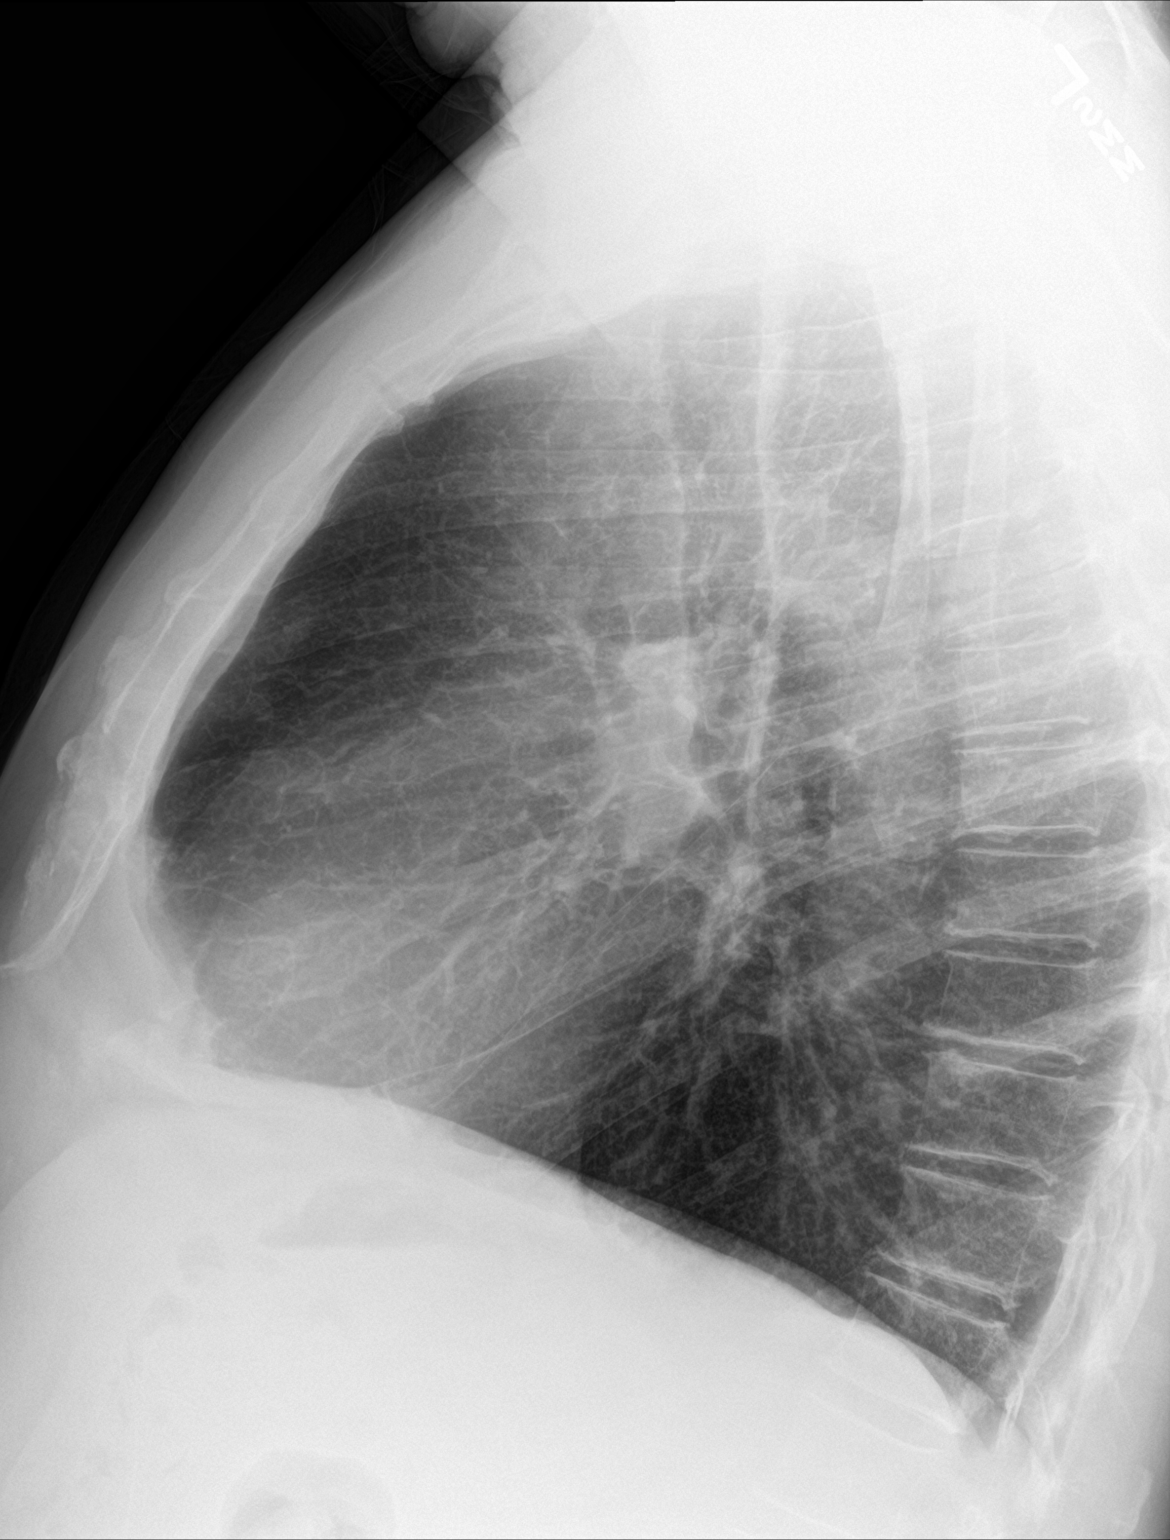

[3 of 3 positions shown; findings below may reference images not displayed]

FINDINGS: Heart and mediastinal contours are within normal limits. No focal
opacities or effusions. No acute bony abnormality.
IMPRESSION: No active cardiopulmonary disease.

## 2022-04-28 ENCOUNTER — Other Ambulatory Visit: Payer: Self-pay

## 2022-04-28 ENCOUNTER — Other Ambulatory Visit: Payer: Self-pay | Admitting: Family Medicine

## 2022-04-28 DIAGNOSIS — I10 Essential (primary) hypertension: Secondary | ICD-10-CM

## 2022-04-28 DIAGNOSIS — K219 Gastro-esophageal reflux disease without esophagitis: Secondary | ICD-10-CM

## 2022-04-28 MED ORDER — HYDROCHLOROTHIAZIDE 12.5 MG PO CAPS
12.5000 mg | ORAL_CAPSULE | Freq: Every day | ORAL | 1 refills | Status: DC
  Filled 2022-04-28: qty 30, 30d supply, fill #0
  Filled 2022-05-29: qty 30, 30d supply, fill #1

## 2022-04-28 MED ORDER — OMEPRAZOLE 20 MG PO CPDR
20.0000 mg | DELAYED_RELEASE_CAPSULE | Freq: Every day | ORAL | 1 refills | Status: DC
  Filled 2022-04-28: qty 30, 30d supply, fill #0
  Filled 2022-05-29: qty 30, 30d supply, fill #1

## 2022-04-29 ENCOUNTER — Other Ambulatory Visit: Payer: Self-pay

## 2022-05-29 ENCOUNTER — Other Ambulatory Visit: Payer: Self-pay

## 2022-05-30 ENCOUNTER — Other Ambulatory Visit: Payer: Self-pay

## 2022-06-03 ENCOUNTER — Other Ambulatory Visit: Payer: Self-pay

## 2022-06-03 ENCOUNTER — Encounter: Payer: Self-pay | Admitting: Family Medicine

## 2022-06-03 ENCOUNTER — Ambulatory Visit (INDEPENDENT_AMBULATORY_CARE_PROVIDER_SITE_OTHER): Payer: Self-pay | Admitting: Family Medicine

## 2022-06-03 VITALS — BP 129/77 | HR 69 | Temp 97.6°F | Ht 69.0 in | Wt 201.6 lb

## 2022-06-03 DIAGNOSIS — E781 Pure hyperglyceridemia: Secondary | ICD-10-CM

## 2022-06-03 DIAGNOSIS — E1149 Type 2 diabetes mellitus with other diabetic neurological complication: Secondary | ICD-10-CM

## 2022-06-03 DIAGNOSIS — I10 Essential (primary) hypertension: Secondary | ICD-10-CM

## 2022-06-03 LAB — POCT URINALYSIS DIPSTICK
Bilirubin, UA: NEGATIVE
Blood, UA: NEGATIVE
Glucose, UA: POSITIVE — AB
Ketones, UA: NEGATIVE
Leukocytes, UA: NEGATIVE
Nitrite, UA: NEGATIVE
Protein, UA: NEGATIVE
Spec Grav, UA: 1.02 (ref 1.010–1.025)
Urobilinogen, UA: 1 E.U./dL
pH, UA: 7 (ref 5.0–8.0)

## 2022-06-03 LAB — POCT GLYCOSYLATED HEMOGLOBIN (HGB A1C): Hemoglobin A1C: 8.7 % — AB (ref 4.0–5.6)

## 2022-06-03 MED ORDER — EMPAGLIFLOZIN 10 MG PO TABS
10.0000 mg | ORAL_TABLET | Freq: Every day | ORAL | 11 refills | Status: DC
  Filled 2022-06-03 – 2022-06-10 (×2): qty 30, 30d supply, fill #0
  Filled 2022-07-08: qty 30, 30d supply, fill #1
  Filled 2022-07-11 – 2022-07-18 (×2): qty 90, 90d supply, fill #1
  Filled 2022-10-02 (×2): qty 90, 90d supply, fill #2
  Filled 2023-01-01: qty 90, 90d supply, fill #3
  Filled 2023-05-18 (×2): qty 60, 60d supply, fill #4

## 2022-06-03 NOTE — Progress Notes (Signed)
Established Patient Office Visit  Subjective   Patient ID: Julian Atkinson, male    DOB: Jun 01, 1966  Age: 56 y.o. MRN: [id number]  Chief Complaint  Patient presents with   Diabetes    Follow up     Julian Atkinson is a 56 year old male with a medical history significant for type 2 diabetes mellitus, obesity, and hyperlipidemia that presents for follow-up of chronic conditions.  Patient states that he has been doing very well and is without complaint on today.  Diabetes He presents for his follow-up diabetic visit. He has type 2 diabetes mellitus. His disease course has been stable. Pertinent negatives for diabetes include no blurred vision, no chest pain, no fatigue, no foot paresthesias, no foot ulcerations, no polydipsia, no polyphagia, no polyuria, no visual change, no weakness and no weight loss. Pertinent negatives for diabetic complications include no heart disease, impotence, PVD or retinopathy. Current diabetic treatment includes oral agent (monotherapy) and diet. He is compliant with treatment most of the time.  Hypertension This is a chronic problem. The problem is controlled. Pertinent negatives include no blurred vision or chest pain. Risk factors for coronary artery disease include obesity. Compliance problems include exercise and diet.  There is no history of PVD or retinopathy. There is no history of chronic renal disease.    Patient Active Problem List   Diagnosis Date Noted   Chronic obstructive pulmonary disease ([place]) 06/01/2017   Diabetes mellitus ([place]) 04/30/2017   Vertigo 04/30/2017   Syncope 04/30/2017   Tobacco dependence 04/30/2017   Dehydration 10/06/2012   High blood pressure 10/06/2012   Lightheadedness 10/05/2012   Tobacco abuse 10/05/2012   Nausea 10/05/2012   Past Medical History:  Diagnosis Date   Diabetes mellitus without complication (HCC)    GERD (gastroesophageal reflux disease)    Hypertension    Migraines    Renal disorder    No past  surgical history on file. Social History   Tobacco Use   Smoking status: Every Day    Packs/day: 1.50    Years: 30.00    Total pack years: 45.00    Types: Cigarettes   Smokeless tobacco: Never  Vaping Use   Vaping Use: Never used  Substance Use Topics   Alcohol use: Yes    Comment: occ   Drug use: Not Currently    Types: Marijuana    Comment: one or twice a week   Social History   Socioeconomic History   Marital status: Divorced    Spouse name: Not on file   Number of children: Not on file   Years of education: Not on file   Highest education level: Not on file  Occupational History   Not on file  Tobacco Use   Smoking status: Every Day    Packs/day: 1.50    Years: 30.00    Total pack years: 45.00    Types: Cigarettes   Smokeless tobacco: Never  Vaping Use   Vaping Use: Never used  Substance and Sexual Activity   Alcohol use: Yes    Comment: occ   Drug use: Not Currently    Types: Marijuana    Comment: one or twice a week   Sexual activity: Not Currently  Other Topics Concern   Not on file  Social History Narrative   Not on file   Social Determinants of Health   Financial Resource Strain: Not on file  Food Insecurity: Not on file  Transportation Needs: Not on file  Physical Activity: Not  on file  Stress: Not on file  Social Connections: Not on file  Intimate Partner Violence: Not on file   No family status information on file.   Family History  Family history unknown: Yes   Allergies  Allergen Reactions   Codeine Nausea And Vomiting   Penicillins Other (See Comments)    Childhood reaction      Review of Systems  Constitutional:  Negative for fatigue and weight loss.  Eyes:  Negative for blurred vision.  Cardiovascular:  Negative for chest pain.  Genitourinary:  Negative for impotence.  Neurological:  Negative for weakness.  Endo/Heme/Allergies:  Negative for polydipsia and polyphagia.      Objective:     BP 129/77   Pulse 69    Temp 97.6 F (36.4 C)   Ht _0  (1.753 m)   Wt 201 lb 9.6 oz (91.4 kg)   SpO2 97%   BMI 29.77 kg/m  BP Readings from Last 3 Encounters:  06/03/22 129/77  10/08/21 116/81  05/21/21 125/77   Wt Readings from Last 3 Encounters:  06/03/22 201 lb 9.6 oz (91.4 kg)  10/08/21 213 lb 12.8 oz (97 kg)  05/21/21 212 lb 0.4 oz (96.2 kg)      Physical Exam Constitutional:      Appearance: Normal appearance.  Eyes:     Pupils: Pupils are equal, round, and reactive to light.  Cardiovascular:     Rate and Rhythm: Normal rate and regular rhythm.     Pulses: Normal pulses.  Pulmonary:     Effort: Pulmonary effort is normal.  Abdominal:     General: Bowel sounds are normal.  Musculoskeletal:        General: Normal range of motion.  Skin:    General: Skin is warm.  Neurological:     General: No focal deficit present.     Mental Status: He is alert. Mental status is at baseline.  Psychiatric:        Mood and Affect: Mood normal.        Behavior: Behavior normal.        Thought Content: Thought content normal.        Judgment: Judgment normal.      No results found for any visits on 06/03/22.  Last CBC Lab Results  Component Value Date   WBC 8.7 03/03/2018   HGB 16.4 03/03/2018   HCT 47.7 03/03/2018   MCV 90 03/03/2018   MCH 31.1 03/03/2018   RDW 13.1 03/03/2018   PLT 198 46/56/8127   Last metabolic panel Lab Results  Component Value Date   GLUCOSE 163 (H) 10/08/2021   NA 142 10/08/2021   K 4.5 10/08/2021   CL 102 10/08/2021   CO2 24 10/08/2021   BUN 13 10/08/2021   CREATININE 0.86 10/08/2021   EGFR 102 10/08/2021   CALCIUM 9.6 10/08/2021   PROT 6.8 10/08/2021   ALBUMIN 4.4 10/08/2021   LABGLOB 2.4 10/08/2021   AGRATIO 1.8 10/08/2021   BILITOT 0.5 10/08/2021   ALKPHOS 50 10/08/2021   AST 11 10/08/2021   ALT 14 10/08/2021   ANIONGAP 9 10/26/2016   Last lipids Lab Results  Component Value Date   CHOL 149 05/21/2021   HDL 31 (L) 05/21/2021   LDLCALC 92  05/21/2021   TRIG 149 05/21/2021   CHOLHDL 4.8 05/21/2021   Last hemoglobin A1c Lab Results  Component Value Date   HGBA1C 9.2 (H) 10/08/2021   Last thyroid functions No results found for: "TSH", "T3TOTAL", "  T4TOTAL", "THYROIDAB" Last vitamin D No results found for: "25OHVITD2", "25OHVITD3", "VD25OH" Last vitamin B12 and Folate No results found for: "VITAMINB12", "FOLATE"    The 10-year ASCVD risk score (Arnett DK, et al., 2019) is: 26.5%    Assessment & Plan:   Problem List Items Addressed This Visit       Endocrine   Diabetes mellitus ([place]) - Primary   Relevant Orders   Urinalysis Dipstick   HgB A1c   Comprehensive metabolic panel   Other Visit Diagnoses     Essential hypertension       Relevant Orders   Urinalysis Dipstick   Comprehensive metabolic panel   Hypertriglyceridemia       Relevant Orders   Lipid Panel      1. Type 2 diabetes mellitus with other neurologic complication, without long-term current use of insulin (HCC) Hemoglobin A1c is 8.7. We will add Jardiance 10 mg daily to your current medication regimen  - Urinalysis Dipstick - HgB A1c - Comprehensive metabolic panel - empagliflozin (JARDIANCE) 10 MG TABS tablet; Take 1 tablet (10 mg total) by mouth daily before breakfast.  Dispense: 30 tablet; Refill: 11  2. Essential hypertension BP 129/77   Pulse 69   Temp 97.6 F (36.4 C)   Ht 5' 9" (1.753 m)   Wt 201 lb 9.6 oz (91.4 kg)   SpO2 97%   BMI 29.77 kg/m   - Urinalysis Dipstick - Comprehensive metabolic panel  3. Hypertriglyceridemia  - Lipid Panel  Return in about 3 months (around 09/03/2022) for diabetes.

## 2022-06-03 NOTE — Patient Instructions (Addendum)
1. Type 2 diabetes mellitus with other neurologic complication, without long-term current use of insulin (HCC) Hemoglobin A1c is 8.7. We will add Jardiance 10 mg daily to your current medication regimen  - Urinalysis Dipstick - HgB A1c - Comprehensive metabolic panel - empagliflozin (JARDIANCE) 10 MG TABS tablet; Take 1 tablet (10 mg total) by mouth daily before breakfast.  Dispense: 30 tablet; Refill: 11  2. Essential hypertension BP 129/77   Pulse 69   Temp 97.6 F (36.4 C)   Ht 5\' 9"  (1.753 m)   Wt 201 lb 9.6 oz (91.4 kg)   SpO2 97%   BMI 29.77 kg/m   - Urinalysis Dipstick - Comprehensive metabolic panel  3. Hypertriglyceridemia  - Lipid Panel   Empagliflozin Tablets What is this medication? EMPAGLIFLOZIN (EM pa gli FLOE zin) treats type 2 diabetes. It works by helping your kidneys remove sugar (glucose) from your blood through the urine, which decreases your blood sugar. It may also be used to lower the risk of worsening heart failure. It can also lower the risk of death caused by heart failure and type 2 diabetes. It works by helping your kidneys remove salt (sodium) from your blood through the urine. This decreases the amount of work the heart has to do. Changes to diet and exercise are often combined with this medication. This medicine may be used for other purposes; ask your health care provider or pharmacist if you have questions. COMMON BRAND NAME(S): Jardiance What should I tell my care team before I take this medication? They need to know if you have any of these conditions: Dehydration Diabetic ketoacidosis Diet low in salt Eating less due to illness, surgery, dieting, or any other reason Frequently drink alcohol Having surgery High cholesterol High levels of potassium in the blood History of pancreatitis or pancreas problems History of yeast infection of the penis or vagina Infections in the bladder, kidneys, or urinary tract Kidney disease Liver disease Low  blood pressure On hemodialysis Problems urinating Type 1 diabetes Uncircumcised male An unusual or allergic reaction to empagliflozin, other medications, foods, dyes, or preservatives Pregnant or trying to get pregnant Breast-feeding How should I use this medication? Take this medication by mouth with water. Take it as directed on the prescription label at the same time every day. You may take it with or without food. Keep taking it unless your care team tells you to stop. A special MedGuide will be given to you by the pharmacist with each prescription and refill. Be sure to read this information carefully each time. Talk to your care team about the use of this medication in children. While it may be prescribed for children as young as 10 years for selected conditions, precautions do apply. Overdosage: If you think you have taken too much of this medicine contact a poison control center or emergency room at once. NOTE: This medicine is only for you. Do not share this medicine with others. What if I miss a dose? If you miss a dose, take it as soon as you can. If it is almost time for your next dose, take only that dose. Do not take double or extra doses. What may interact with this medication? Alcohol Diuretics Insulin Lithium This list may not describe all possible interactions. Give your health care provider a list of all the medicines, herbs, non-prescription drugs, or dietary supplements you use. Also tell them if you smoke, drink alcohol, or use illegal drugs. Some items may interact with your medicine. What  should I watch for while using this medication? Visit your care team for regular checks on your progress. Tell your care team if your symptoms do not start to get better or if they get worse. This medication can cause a serious condition in which there is too much acid in the blood. If you develop nausea, vomiting, stomach pain, unusual tiredness, or breathing problems, stop taking  this medication and call your care team right away. If possible, use a ketone dipstick to check for ketones in your urine. Check with your care team if you have severe diarrhea, nausea, and vomiting, or if you sweat a lot. The loss of too much body fluid may make it dangerous for you to take this medication. A test called the HbA1C (A1C) will be monitored. This is a simple blood test. It measures your blood sugar control over the last 2 to 3 months. You will receive this test every 3 to 6 months. Learn how to check your blood sugar. Learn the symptoms of low and high blood sugar and how to manage them. Always carry a quick-source of sugar with you in case you have symptoms of low blood sugar. Examples include hard sugar candy or glucose tablets. Make sure others know that you can choke if you eat or drink when you develop serious symptoms of low blood sugar, such as seizures or unconsciousness. Get medical help at once. Tell your care team if you have high blood sugar. You might need to change the dose of your medication. If you are sick or exercising more than usual, you may need to change the dose of your medication. What side effects may I notice from receiving this medication? Side effects that you should report to your care team as soon as possible: Allergic reactions--skin rash, itching, hives, swelling of the face, lips, tongue, or throat Dehydration--increased thirst, dry mouth, feeling faint or lightheaded, headache, dark yellow or brown urine Diabetic ketoacidosis (DKA)--increased thirst or amount of urine, dry mouth, fatigue, fruity odor to breath, trouble breathing, stomach pain, nausea, vomiting Genital yeast infection--redness, swelling, pain, or itchiness, odor, thick or lumpy discharge New pain or tenderness, change in skin color, sores or ulcers, infection of the leg or foot Infection or redness, swelling, tenderness, or pain in the genitals, or area from the genitals to the back of the  rectum Urinary tract infection (UTI)--burning when passing urine, passing frequent small amounts of urine, bloody or cloudy urine, pain in the lower back or sides This list may not describe all possible side effects. Call your doctor for medical advice about side effects. You may report side effects to FDA at 1-800-FDA-1088. Where should I keep my medication? Keep out of the reach of children and pets. Store at room temperature between 20 and 25 degrees C (68 and 77 degrees F). Get rid of any unused medication after the expiration date. To get rid of medications that are no longer needed or have expired: Take the medication to a medication take-back program. Check with your pharmacy or law enforcement to find a location. If you cannot return the medication, check the label or package insert to see if the medication should be thrown out in the garbage or flushed down the toilet. If you are not sure, ask your care team. If it is safe to put it in the trash, take the medication out of the container. Mix the medication with cat litter, dirt, coffee grounds, or other unwanted substance. Seal the mixture in a  bag or container. Put it in the trash. NOTE: This sheet is a summary. It may not cover all possible information. If you have questions about this medicine, talk to your doctor, pharmacist, or health care provider.  2023 Elsevier/Gold Standard (2020-09-20 00:00:00)

## 2022-06-04 LAB — COMPREHENSIVE METABOLIC PANEL
ALT: 15 IU/L (ref 0–44)
AST: 13 IU/L (ref 0–40)
Albumin/Globulin Ratio: 2 (ref 1.2–2.2)
Albumin: 4.7 g/dL (ref 3.8–4.9)
Alkaline Phosphatase: 53 IU/L (ref 44–121)
BUN/Creatinine Ratio: 16 (ref 9–20)
BUN: 13 mg/dL (ref 6–24)
Bilirubin Total: 0.7 mg/dL (ref 0.0–1.2)
CO2: 24 mmol/L (ref 20–29)
Calcium: 9.4 mg/dL (ref 8.7–10.2)
Chloride: 97 mmol/L (ref 96–106)
Creatinine, Ser: 0.81 mg/dL (ref 0.76–1.27)
Globulin, Total: 2.4 g/dL (ref 1.5–4.5)
Glucose: 170 mg/dL — ABNORMAL HIGH (ref 70–99)
Potassium: 4.2 mmol/L (ref 3.5–5.2)
Sodium: 136 mmol/L (ref 134–144)
Total Protein: 7.1 g/dL (ref 6.0–8.5)
eGFR: 103 mL/min/{1.73_m2} (ref >59)

## 2022-06-04 LAB — LIPID PANEL
Chol/HDL Ratio: 4.5 ratio (ref 0.0–5.0)
Cholesterol, Total: 134 mg/dL (ref 100–199)
HDL: 30 mg/dL — ABNORMAL LOW (ref >39)
LDL Chol Calc (NIH): 73 mg/dL (ref 0–99)
Triglycerides: 184 mg/dL — ABNORMAL HIGH (ref 0–149)
VLDL Cholesterol Cal: 31 mg/dL (ref 5–40)

## 2022-06-10 ENCOUNTER — Other Ambulatory Visit: Payer: Self-pay

## 2022-06-11 ENCOUNTER — Other Ambulatory Visit: Payer: Self-pay

## 2022-06-17 ENCOUNTER — Other Ambulatory Visit: Payer: Self-pay

## 2022-06-20 ENCOUNTER — Other Ambulatory Visit: Payer: Self-pay

## 2022-06-23 ENCOUNTER — Other Ambulatory Visit: Payer: Self-pay

## 2022-06-27 ENCOUNTER — Other Ambulatory Visit: Payer: Self-pay

## 2022-07-08 ENCOUNTER — Other Ambulatory Visit: Payer: Self-pay | Admitting: Family Medicine

## 2022-07-08 ENCOUNTER — Other Ambulatory Visit: Payer: Self-pay

## 2022-07-08 DIAGNOSIS — E781 Pure hyperglyceridemia: Secondary | ICD-10-CM

## 2022-07-08 DIAGNOSIS — R42 Dizziness and giddiness: Secondary | ICD-10-CM

## 2022-07-08 DIAGNOSIS — K219 Gastro-esophageal reflux disease without esophagitis: Secondary | ICD-10-CM

## 2022-07-08 DIAGNOSIS — I10 Essential (primary) hypertension: Secondary | ICD-10-CM

## 2022-07-09 ENCOUNTER — Other Ambulatory Visit: Payer: Self-pay

## 2022-07-10 ENCOUNTER — Other Ambulatory Visit: Payer: Self-pay | Admitting: Family Medicine

## 2022-07-10 ENCOUNTER — Other Ambulatory Visit: Payer: Self-pay

## 2022-07-10 DIAGNOSIS — K219 Gastro-esophageal reflux disease without esophagitis: Secondary | ICD-10-CM

## 2022-07-10 DIAGNOSIS — I10 Essential (primary) hypertension: Secondary | ICD-10-CM

## 2022-07-10 DIAGNOSIS — R42 Dizziness and giddiness: Secondary | ICD-10-CM

## 2022-07-10 DIAGNOSIS — E781 Pure hyperglyceridemia: Secondary | ICD-10-CM

## 2022-07-10 MED ORDER — HYDROCHLOROTHIAZIDE 12.5 MG PO CAPS
12.5000 mg | ORAL_CAPSULE | Freq: Every day | ORAL | 1 refills | Status: DC
  Filled 2022-07-10: qty 30, 30d supply, fill #0
  Filled 2022-08-07: qty 30, 30d supply, fill #1

## 2022-07-10 MED ORDER — OMEPRAZOLE 20 MG PO CPDR
20.0000 mg | DELAYED_RELEASE_CAPSULE | Freq: Every day | ORAL | 1 refills | Status: DC
  Filled 2022-07-10: qty 30, 30d supply, fill #0
  Filled 2022-08-07: qty 30, 30d supply, fill #1

## 2022-07-10 MED ORDER — ROSUVASTATIN CALCIUM 20 MG PO TABS
20.0000 mg | ORAL_TABLET | Freq: Every day | ORAL | 3 refills | Status: DC
  Filled 2022-07-10: qty 30, 30d supply, fill #0
  Filled 2022-08-07: qty 30, 30d supply, fill #1
  Filled 2022-09-10: qty 30, 30d supply, fill #2
  Filled 2022-10-13: qty 30, 30d supply, fill #3

## 2022-07-10 MED ORDER — MECLIZINE HCL 25 MG PO TABS
25.0000 mg | ORAL_TABLET | Freq: Two times a day (BID) | ORAL | 2 refills | Status: DC | PRN
  Filled 2022-07-10: qty 30, 15d supply, fill #0
  Filled 2022-08-25: qty 30, 15d supply, fill #1
  Filled 2022-10-02: qty 30, 15d supply, fill #2

## 2022-07-10 NOTE — Telephone Encounter (Signed)
Please advise KH 

## 2022-07-11 ENCOUNTER — Other Ambulatory Visit: Payer: Self-pay

## 2022-07-17 ENCOUNTER — Other Ambulatory Visit: Payer: Self-pay

## 2022-07-18 ENCOUNTER — Other Ambulatory Visit: Payer: Self-pay

## 2022-07-22 ENCOUNTER — Other Ambulatory Visit: Payer: Self-pay

## 2022-08-04 ENCOUNTER — Other Ambulatory Visit: Payer: Self-pay

## 2022-08-05 ENCOUNTER — Other Ambulatory Visit: Payer: Self-pay

## 2022-08-07 ENCOUNTER — Other Ambulatory Visit: Payer: Self-pay

## 2022-08-07 ENCOUNTER — Other Ambulatory Visit: Payer: Self-pay | Admitting: Family Medicine

## 2022-08-07 DIAGNOSIS — J449 Chronic obstructive pulmonary disease, unspecified: Secondary | ICD-10-CM

## 2022-08-07 MED ORDER — ALBUTEROL SULFATE HFA 108 (90 BASE) MCG/ACT IN AERS
2.0000 | INHALATION_SPRAY | Freq: Four times a day (QID) | RESPIRATORY_TRACT | 5 refills | Status: DC | PRN
  Filled 2022-08-07: qty 6.7, 25d supply, fill #0
  Filled 2022-09-10: qty 6.7, 25d supply, fill #1
  Filled 2022-10-13: qty 6.7, 25d supply, fill #2
  Filled 2022-11-13: qty 6.7, 25d supply, fill #3
  Filled 2023-01-01: qty 6.7, 25d supply, fill #4
  Filled 2023-04-10: qty 6.7, 25d supply, fill #5

## 2022-08-12 ENCOUNTER — Other Ambulatory Visit: Payer: Self-pay

## 2022-08-25 ENCOUNTER — Other Ambulatory Visit: Payer: Self-pay

## 2022-09-02 ENCOUNTER — Encounter: Payer: Self-pay | Admitting: Family Medicine

## 2022-09-02 ENCOUNTER — Ambulatory Visit (INDEPENDENT_AMBULATORY_CARE_PROVIDER_SITE_OTHER): Payer: Self-pay | Admitting: Family Medicine

## 2022-09-02 VITALS — BP 111/65 | HR 65 | Temp 97.3°F | Ht 69.0 in | Wt 188.6 lb

## 2022-09-02 DIAGNOSIS — E1149 Type 2 diabetes mellitus with other diabetic neurological complication: Secondary | ICD-10-CM

## 2022-09-02 DIAGNOSIS — I10 Essential (primary) hypertension: Secondary | ICD-10-CM

## 2022-09-02 DIAGNOSIS — E781 Pure hyperglyceridemia: Secondary | ICD-10-CM

## 2022-09-02 LAB — POCT GLYCOSYLATED HEMOGLOBIN (HGB A1C): Hemoglobin A1C: 6.6 % — AB (ref 4.0–5.6)

## 2022-09-02 NOTE — Progress Notes (Signed)
Subjective   Patient ID: Julian Atkinson, male    DOB: 1966/03/28  Age: 57 y.o. MRN: [id number]  Chief Complaint  Patient presents with   Diabetes    Follow up    Julian Atkinson is a very pleasant 57 year old male with a medical history significant for type 2 diabetes mellitus, hypertension, tobacco dependence, and hyperlipidemia presents for follow-up of chronic conditions.  Patient states that he has been doing well over the past several months.  He has been following a carbohydrate modified diet per his daughter.  Patient states that he is cut out sugar.  He has also been taking all medications consistently.  He does not check blood glucose at home. Patient also has a history of hypertension.  He does not check his blood pressure at home.  He denies any chest pain, shortness of breath, orthopnea, or lower extremity swelling.  Diabetes He has type 2 diabetes mellitus. Pertinent negatives for hypoglycemia include no confusion, dizziness, headaches, hunger, mood changes, nervousness/anxiousness, pallor, seizures, sleepiness, speech difficulty, sweats or tremors. Pertinent negatives for diabetes include no blurred vision and no chest pain. He has not had a previous visit with a dietitian. He rarely participates in exercise. There is no change in his home blood glucose trend. An ACE inhibitor/angiotensin II receptor blocker is being taken. He does not see a podiatrist.Eye exam is current.  Hypertension The problem is controlled. Pertinent negatives include no anxiety, blurred vision, chest pain, headaches, malaise/fatigue, neck pain, orthopnea, palpitations, peripheral edema, PND, shortness of breath or sweats.    Past Medical History:  Diagnosis Date   Diabetes mellitus without complication (HCC)    GERD (gastroesophageal reflux disease)    Hypertension    Migraines    Renal disorder     Social History   Socioeconomic History   Marital status: Divorced    Spouse name: Not on file    Number of children: Not on file   Years of education: Not on file   Highest education level: Not on file  Occupational History   Not on file  Tobacco Use   Smoking status: Every Day    Packs/day: 1.50    Years: 30.00    Total pack years: 45.00    Types: Cigarettes   Smokeless tobacco: Never  Vaping Use   Vaping Use: Never used  Substance and Sexual Activity   Alcohol use: Yes    Comment: occ   Drug use: Not Currently    Types: Marijuana    Comment: one or twice a week   Sexual activity: Not Currently  Other Topics Concern   Not on file  Social History Narrative   Not on file   Social Determinants of Health   Financial Resource Strain: Not on file  Food Insecurity: Not on file  Transportation Needs: Not on file  Physical Activity: Not on file  Stress: Not on file  Social Connections: Not on file  Intimate Partner Violence: Not on file   Immunization History  Administered Date(s) Administered   PFIZER(Purple Top)SARS-COV-2 Vaccination 03/21/2020, 04/04/2020   Pneumococcal Polysaccharide-23 08/14/2020   Tdap 09/02/2017    Allergies  Allergen Reactions   Codeine Nausea And Vomiting   Penicillins Other (See Comments)    Childhood reaction     Review of Systems  Constitutional:  Negative for malaise/fatigue.  Eyes:  Negative for blurred vision.  Respiratory:  Negative for shortness of breath.   Cardiovascular:  Negative for chest pain, palpitations, orthopnea and PND.  Musculoskeletal:  Negative for neck pain.  Skin:  Negative for pallor.  Neurological:  Negative for dizziness, tremors, seizures, speech difficulty and headaches.  Psychiatric/Behavioral:  Negative for confusion. The patient is not nervous/anxious.       Objective:     BP 111/65   Pulse 65   Temp (!) 97.3 F (36.3 C)   Ht 5' 9"$  (1.753 m)   Wt 188 lb 9.6 oz (85.5 kg)   SpO2 96%   BMI 27.85 kg/m  BP Readings from Last 3 Encounters:  09/02/22 111/65  06/03/22 129/77  10/08/21 116/81    Wt Readings from Last 3 Encounters:  09/02/22 188 lb 9.6 oz (85.5 kg)  06/03/22 201 lb 9.6 oz (91.4 kg)  10/08/21 213 lb 12.8 oz (97 kg)      Physical Exam Constitutional:      Appearance: Normal appearance.  Eyes:     Pupils: Pupils are equal, round, and reactive to light.  Cardiovascular:     Rate and Rhythm: Normal rate and regular rhythm.     Pulses: Normal pulses.  Pulmonary:     Effort: Pulmonary effort is normal.  Abdominal:     General: Bowel sounds are normal.  Musculoskeletal:        General: Normal range of motion.  Skin:    General: Skin is warm.  Neurological:     General: No focal deficit present.     Mental Status: He is alert. Mental status is at baseline.  Psychiatric:        Mood and Affect: Mood normal.        Behavior: Behavior normal.        Thought Content: Thought content normal.        Judgment: Judgment normal.      Results for orders placed or performed in visit on 09/02/22  POCT glycosylated hemoglobin (Hb A1C)  Result Value Ref Range   Hemoglobin A1C 6.6 (A) 4.0 - 5.6 %   HbA1c POC (<> result, manual entry)     HbA1c, POC (prediabetic range)     HbA1c, POC (controlled diabetic range)      Last CBC Lab Results  Component Value Date   WBC 8.7 03/03/2018   HGB 16.4 03/03/2018   HCT 47.7 03/03/2018   MCV 90 03/03/2018   MCH 31.1 03/03/2018   RDW 13.1 03/03/2018   PLT 198 [id number]   Last metabolic panel Lab Results  Component Value Date   GLUCOSE 170 (H) 06/03/2022   NA 136 06/03/2022   K 4.2 06/03/2022   CL 97 06/03/2022   CO2 24 06/03/2022   BUN 13 06/03/2022   CREATININE 0.81 06/03/2022   EGFR 103 06/03/2022   CALCIUM 9.4 06/03/2022   PROT 7.1 06/03/2022   ALBUMIN 4.7 06/03/2022   LABGLOB 2.4 06/03/2022   AGRATIO 2.0 06/03/2022   BILITOT 0.7 06/03/2022   ALKPHOS 53 06/03/2022   AST 13 06/03/2022   ALT 15 06/03/2022   ANIONGAP 9 10/26/2016   Last lipids Lab Results  Component Value Date   CHOL 134  06/03/2022   HDL 30 (L) 06/03/2022   LDLCALC 73 06/03/2022   TRIG 184 (H) 06/03/2022   CHOLHDL 4.5 06/03/2022   Last hemoglobin A1c Lab Results  Component Value Date   HGBA1C 6.6 (A) 09/02/2022   Last thyroid functions No results found for: "TSH", "T3TOTAL", "T4TOTAL", "THYROIDAB" Last vitamin D No results found for: "25OHVITD2", "25OHVITD3", "VD25OH" Last vitamin B12 and Folate No results found for: "  VITAMINB12", "FOLATE"    The 10-year ASCVD risk score (Arnett DK, et al., 2019) is: 19.4%    Assessment & Plan:   Problem List Items Addressed This Visit       Endocrine   Diabetes mellitus ([place]) - Primary   Relevant Orders   Microalbumin/Creatinine Ratio, Urine   POCT glycosylated hemoglobin (Hb A1C) (Completed)   Comprehensive metabolic panel   Other Visit Diagnoses     Essential hypertension       Relevant Orders   Comprehensive metabolic panel   Hypertriglyceridemia          1. Type 2 diabetes mellitus with other neurologic complication, without long-term current use of insulin (HCC) Patient's hemoglobin A1c decreased from 8.7-6.6 over the past 3 months.  Patient congratulated.  No medication changes warranted.  Recommend that he continues a carbohydrate modified diet. - Microalbumin/Creatinine Ratio, Urine - POCT glycosylated hemoglobin (Hb A1C) - Comprehensive metabolic panel  2. Essential hypertension Blood pressure well-controlled on current medication regimen, no changes. - Comprehensive metabolic panel  3. Hypertriglyceridemia Will recheck lipid panel in 3 months.  Return in about 3 months (around 12/01/2022) for diabetes, hypertension, hyperlipidemia.   Donia Pounds  APRN, MSN, FNP-C [facility] [address] [place], [place] [address] [phone]

## 2022-09-02 NOTE — Patient Instructions (Signed)
No medication changes on today.   Congratulations, you have reached your goal. Your A1C goal is less than 7. Your fasting blood sugar  Upon awakening goal is between 110-140.  Your LDL  (bad cholesterol goal is less than 100 Blood pressure goal is <140/90.  Recommend a lowfat, low carbohydrate diet divided over 5-6 small meals, increase water intake to 6-8 glasses, and 150 minutes per week of cardiovascular exercise.   Take your medications as prescribed Make sure that you are familiar with each one of your medications and what they are used to treat.  If you are unsure of medications, please bring to follow up Please keep your scheduled follow up appointment.

## 2022-09-02 NOTE — Progress Notes (Signed)
Established Patient Office Visit  Subjective   Patient ID: Julian Atkinson, male    DOB: 1965/09/17  Age: 57 y.o. MRN: [id number]  Chief Complaint  Patient presents with   Diabetes    Follow up    Julian Atkinson is a very pleasant 57 year old male with a medical history significant for type 2 diabetes mellitus, hypertension, tobacco dependence, and hyperlipidemia presents for follow-up of chronic conditions.  Patient states that he has been doing well over the past several months.  He has been following a carbohydrate modified diet per his daughter.  Patient states that he is cut out sugar.  He has also been taking all medications consistently.  He does not check blood glucose at home. Patient also has a history of hypertension.  He does not check his blood pressure at home.  He denies any chest pain, shortness of breath, orthopnea, or lower extremity swelling.  Diabetes He has type 2 diabetes mellitus. Pertinent negatives for hypoglycemia include no confusion, dizziness, headaches, hunger, mood changes, nervousness/anxiousness, pallor, seizures, sleepiness, speech difficulty, sweats or tremors. Pertinent negatives for diabetes include no blurred vision and no chest pain. He has not had a previous visit with a dietitian. He rarely participates in exercise. There is no change in his home blood glucose trend. An ACE inhibitor/angiotensin II receptor blocker is being taken. He does not see a podiatrist.Eye exam is current.  Hypertension The problem is controlled. Pertinent negatives include no anxiety, blurred vision, chest pain, headaches, malaise/fatigue, neck pain, orthopnea, palpitations, peripheral edema, PND, shortness of breath or sweats.    Past Medical History:  Diagnosis Date   Diabetes mellitus without complication (HCC)    GERD (gastroesophageal reflux disease)    Hypertension    Migraines    Renal disorder     Social History   Socioeconomic History   Marital status:  Divorced    Spouse name: Not on file   Number of children: Not on file   Years of education: Not on file   Highest education level: Not on file  Occupational History   Not on file  Tobacco Use   Smoking status: Every Day    Packs/day: 1.50    Years: 30.00    Total pack years: 45.00    Types: Cigarettes   Smokeless tobacco: Never  Vaping Use   Vaping Use: Never used  Substance and Sexual Activity   Alcohol use: Yes    Comment: occ   Drug use: Not Currently    Types: Marijuana    Comment: one or twice a week   Sexual activity: Not Currently  Other Topics Concern   Not on file  Social History Narrative   Not on file   Social Determinants of Health   Financial Resource Strain: Not on file  Food Insecurity: Not on file  Transportation Needs: Not on file  Physical Activity: Not on file  Stress: Not on file  Social Connections: Not on file  Intimate Partner Violence: Not on file   Immunization History  Administered Date(s) Administered   PFIZER(Purple Top)SARS-COV-2 Vaccination 03/21/2020, 04/04/2020   Pneumococcal Polysaccharide-23 08/14/2020   Tdap 09/02/2017    Allergies  Allergen Reactions   Codeine Nausea And Vomiting   Penicillins Other (See Comments)    Childhood reaction     Review of Systems  Constitutional:  Negative for malaise/fatigue.  Eyes:  Negative for blurred vision.  Respiratory:  Negative for shortness of breath.   Cardiovascular:  Negative for chest pain,  palpitations, orthopnea and PND.  Musculoskeletal:  Negative for neck pain.  Skin:  Negative for pallor.  Neurological:  Negative for dizziness, tremors, seizures, speech difficulty and headaches.  Psychiatric/Behavioral:  Negative for confusion. The patient is not nervous/anxious.       Objective:     BP 111/65   Pulse 65   Temp (!) 97.3 F (36.3 C)   Ht 5' 9"$  (1.753 m)   Wt 188 lb 9.6 oz (85.5 kg)   SpO2 96%   BMI 27.85 kg/m  BP Readings from Last 3 Encounters:  09/02/22  111/65  06/03/22 129/77  10/08/21 116/81   Wt Readings from Last 3 Encounters:  09/02/22 188 lb 9.6 oz (85.5 kg)  06/03/22 201 lb 9.6 oz (91.4 kg)  10/08/21 213 lb 12.8 oz (97 kg)      Physical Exam Constitutional:      Appearance: Normal appearance.  Eyes:     Pupils: Pupils are equal, round, and reactive to light.  Cardiovascular:     Rate and Rhythm: Normal rate and regular rhythm.     Pulses: Normal pulses.  Pulmonary:     Effort: Pulmonary effort is normal.  Abdominal:     General: Bowel sounds are normal.  Musculoskeletal:        General: Normal range of motion.  Skin:    General: Skin is warm.  Neurological:     General: No focal deficit present.     Mental Status: He is alert. Mental status is at baseline.  Psychiatric:        Mood and Affect: Mood normal.        Behavior: Behavior normal.        Thought Content: Thought content normal.        Judgment: Judgment normal.      Results for orders placed or performed in visit on 09/02/22  POCT glycosylated hemoglobin (Hb A1C)  Result Value Ref Range   Hemoglobin A1C 6.6 (A) 4.0 - 5.6 %   HbA1c POC (<> result, manual entry)     HbA1c, POC (prediabetic range)     HbA1c, POC (controlled diabetic range)      Last CBC Lab Results  Component Value Date   WBC 8.7 03/03/2018   HGB 16.4 03/03/2018   HCT 47.7 03/03/2018   MCV 90 03/03/2018   MCH 31.1 03/03/2018   RDW 13.1 03/03/2018   PLT 198 [id number]   Last metabolic panel Lab Results  Component Value Date   GLUCOSE 170 (H) 06/03/2022   NA 136 06/03/2022   K 4.2 06/03/2022   CL 97 06/03/2022   CO2 24 06/03/2022   BUN 13 06/03/2022   CREATININE 0.81 06/03/2022   EGFR 103 06/03/2022   CALCIUM 9.4 06/03/2022   PROT 7.1 06/03/2022   ALBUMIN 4.7 06/03/2022   LABGLOB 2.4 06/03/2022   AGRATIO 2.0 06/03/2022   BILITOT 0.7 06/03/2022   ALKPHOS 53 06/03/2022   AST 13 06/03/2022   ALT 15 06/03/2022   ANIONGAP 9 10/26/2016   Last lipids Lab Results   Component Value Date   CHOL 134 06/03/2022   HDL 30 (L) 06/03/2022   LDLCALC 73 06/03/2022   TRIG 184 (H) 06/03/2022   CHOLHDL 4.5 06/03/2022   Last hemoglobin A1c Lab Results  Component Value Date   HGBA1C 6.6 (A) 09/02/2022   Last thyroid functions No results found for: "TSH", "T3TOTAL", "T4TOTAL", "THYROIDAB" Last vitamin D No results found for: "25OHVITD2", "25OHVITD3", "VD25OH" Last vitamin B12 and  Folate No results found for: "VITAMINB12", "FOLATE"    The 10-year ASCVD risk score (Arnett DK, et al., 2019) is: 19.4%    Assessment & Plan:   Problem List Items Addressed This Visit       Endocrine   Diabetes mellitus ([place]) - Primary   Relevant Orders   Microalbumin/Creatinine Ratio, Urine   POCT glycosylated hemoglobin (Hb A1C) (Completed)   Comprehensive metabolic panel   Other Visit Diagnoses     Essential hypertension       Relevant Orders   Comprehensive metabolic panel   Hypertriglyceridemia          1. Type 2 diabetes mellitus with other neurologic complication, without long-term current use of insulin (HCC) Patient's hemoglobin A1c decreased from 8.7-6.6 over the past 3 months.  Patient congratulated.  No medication changes warranted.  Recommend that he continues a carbohydrate modified diet. - Microalbumin/Creatinine Ratio, Urine - POCT glycosylated hemoglobin (Hb A1C) - Comprehensive metabolic panel  2. Essential hypertension Blood pressure well-controlled on current medication regimen, no changes. - Comprehensive metabolic panel  3. Hypertriglyceridemia Will recheck lipid panel in 3 months.  Return in about 3 months (around 12/01/2022) for diabetes, hypertension, hyperlipidemia.   Donia Pounds  APRN, MSN, FNP-C [facility] [address] [place], [place] [address] [phone]

## 2022-09-03 LAB — COMPREHENSIVE METABOLIC PANEL
ALT: 15 IU/L (ref 0–44)
AST: 13 IU/L (ref 0–40)
Albumin/Globulin Ratio: 1.6 (ref 1.2–2.2)
Albumin: 4.7 g/dL (ref 3.8–4.9)
Alkaline Phosphatase: 51 IU/L (ref 44–121)
BUN/Creatinine Ratio: 20 (ref 9–20)
BUN: 16 mg/dL (ref 6–24)
Bilirubin Total: 0.6 mg/dL (ref 0.0–1.2)
CO2: 20 mmol/L (ref 20–29)
Calcium: 9.7 mg/dL (ref 8.7–10.2)
Chloride: 98 mmol/L (ref 96–106)
Creatinine, Ser: 0.79 mg/dL (ref 0.76–1.27)
Globulin, Total: 2.9 g/dL (ref 1.5–4.5)
Glucose: 96 mg/dL (ref 70–99)
Potassium: 3.9 mmol/L (ref 3.5–5.2)
Sodium: 138 mmol/L (ref 134–144)
Total Protein: 7.6 g/dL (ref 6.0–8.5)
eGFR: 104 mL/min/{1.73_m2} (ref >59)

## 2022-09-03 LAB — MICROALBUMIN / CREATININE URINE RATIO
Creatinine, Urine: 106 mg/dL
Microalb/Creat Ratio: 10 mg/g creat (ref 0–29)
Microalbumin, Urine: 10.4 ug/mL

## 2022-09-10 ENCOUNTER — Other Ambulatory Visit: Payer: Self-pay

## 2022-09-10 ENCOUNTER — Other Ambulatory Visit: Payer: Self-pay | Admitting: Family Medicine

## 2022-09-10 DIAGNOSIS — I10 Essential (primary) hypertension: Secondary | ICD-10-CM

## 2022-09-10 DIAGNOSIS — K219 Gastro-esophageal reflux disease without esophagitis: Secondary | ICD-10-CM

## 2022-09-11 ENCOUNTER — Other Ambulatory Visit: Payer: Self-pay

## 2022-09-11 MED ORDER — OMEPRAZOLE 20 MG PO CPDR
20.0000 mg | DELAYED_RELEASE_CAPSULE | Freq: Every day | ORAL | 1 refills | Status: DC
  Filled 2022-09-11: qty 30, 30d supply, fill #0
  Filled 2022-10-13: qty 30, 30d supply, fill #1

## 2022-09-11 MED ORDER — HYDROCHLOROTHIAZIDE 12.5 MG PO CAPS
12.5000 mg | ORAL_CAPSULE | Freq: Every day | ORAL | 1 refills | Status: DC
  Filled 2022-09-11: qty 30, 30d supply, fill #0
  Filled 2022-10-13: qty 30, 30d supply, fill #1

## 2022-09-12 ENCOUNTER — Other Ambulatory Visit: Payer: Self-pay

## 2022-09-15 ENCOUNTER — Other Ambulatory Visit: Payer: Self-pay

## 2022-10-02 ENCOUNTER — Other Ambulatory Visit: Payer: Self-pay

## 2022-10-13 ENCOUNTER — Other Ambulatory Visit: Payer: Self-pay

## 2022-10-14 ENCOUNTER — Other Ambulatory Visit: Payer: Self-pay

## 2022-11-03 ENCOUNTER — Other Ambulatory Visit: Payer: Self-pay | Admitting: Nurse Practitioner

## 2022-11-03 ENCOUNTER — Other Ambulatory Visit: Payer: Self-pay

## 2022-11-03 ENCOUNTER — Other Ambulatory Visit: Payer: Self-pay | Admitting: Family Medicine

## 2022-11-03 DIAGNOSIS — I10 Essential (primary) hypertension: Secondary | ICD-10-CM

## 2022-11-03 DIAGNOSIS — R42 Dizziness and giddiness: Secondary | ICD-10-CM

## 2022-11-03 DIAGNOSIS — E781 Pure hyperglyceridemia: Secondary | ICD-10-CM

## 2022-11-03 DIAGNOSIS — E1149 Type 2 diabetes mellitus with other diabetic neurological complication: Secondary | ICD-10-CM

## 2022-11-03 MED ORDER — METFORMIN HCL 1000 MG PO TABS
1000.0000 mg | ORAL_TABLET | Freq: Two times a day (BID) | ORAL | 2 refills | Status: DC
Start: 2022-11-03 — End: 2023-10-05
  Filled 2022-11-03: qty 180, 90d supply, fill #0
  Filled 2023-02-06: qty 180, 90d supply, fill #1
  Filled 2023-07-13: qty 180, 90d supply, fill #2

## 2022-11-03 MED ORDER — FENOFIBRATE 145 MG PO TABS
145.0000 mg | ORAL_TABLET | Freq: Every day | ORAL | 11 refills | Status: DC
  Filled 2022-11-03: qty 90, 90d supply, fill #0
  Filled 2023-02-06: qty 90, 90d supply, fill #1
  Filled 2023-05-18: qty 90, 90d supply, fill #2
  Filled 2023-08-24: qty 90, 90d supply, fill #3

## 2022-11-03 MED ORDER — LISINOPRIL 2.5 MG PO TABS
2.5000 mg | ORAL_TABLET | Freq: Every day | ORAL | 11 refills | Status: DC
  Filled 2022-11-03: qty 90, 90d supply, fill #0
  Filled 2023-02-06: qty 90, 90d supply, fill #1
  Filled 2023-05-18: qty 90, 90d supply, fill #2
  Filled 2023-08-24: qty 90, 90d supply, fill #3

## 2022-11-03 MED ORDER — MECLIZINE HCL 25 MG PO TABS
25.0000 mg | ORAL_TABLET | Freq: Two times a day (BID) | ORAL | 2 refills | Status: DC | PRN
Start: 2022-11-03 — End: 2023-01-16
  Filled 2022-11-03: qty 30, 15d supply, fill #0
  Filled 2022-12-01: qty 30, 15d supply, fill #1
  Filled 2023-01-01: qty 30, 15d supply, fill #2

## 2022-11-03 NOTE — Telephone Encounter (Signed)
Please advise Kh 

## 2022-11-05 ENCOUNTER — Other Ambulatory Visit: Payer: Self-pay

## 2022-11-13 ENCOUNTER — Other Ambulatory Visit: Payer: Self-pay

## 2022-11-13 ENCOUNTER — Other Ambulatory Visit: Payer: Self-pay | Admitting: Family Medicine

## 2022-11-13 DIAGNOSIS — K219 Gastro-esophageal reflux disease without esophagitis: Secondary | ICD-10-CM

## 2022-11-13 DIAGNOSIS — E781 Pure hyperglyceridemia: Secondary | ICD-10-CM

## 2022-11-13 DIAGNOSIS — I10 Essential (primary) hypertension: Secondary | ICD-10-CM

## 2022-11-13 MED ORDER — OMEPRAZOLE 20 MG PO CPDR
20.0000 mg | DELAYED_RELEASE_CAPSULE | Freq: Every day | ORAL | 1 refills | Status: DC
Start: 2022-11-13 — End: 2023-01-16
  Filled 2022-11-13: qty 30, 30d supply, fill #0
  Filled 2022-12-16: qty 30, 30d supply, fill #1

## 2022-11-13 MED ORDER — HYDROCHLOROTHIAZIDE 12.5 MG PO CAPS
12.5000 mg | ORAL_CAPSULE | Freq: Every day | ORAL | 1 refills | Status: DC
Start: 2022-11-13 — End: 2023-01-16
  Filled 2022-11-13: qty 30, 30d supply, fill #0
  Filled 2022-12-16: qty 30, 30d supply, fill #1

## 2022-11-13 MED ORDER — ROSUVASTATIN CALCIUM 20 MG PO TABS
20.0000 mg | ORAL_TABLET | Freq: Every day | ORAL | 3 refills | Status: DC
Start: 2022-11-13 — End: 2023-04-03
  Filled 2022-11-13: qty 30, 30d supply, fill #0
  Filled 2022-12-16: qty 30, 30d supply, fill #1
  Filled 2023-01-16 – 2023-01-26 (×2): qty 30, 30d supply, fill #2
  Filled 2023-03-03: qty 30, 30d supply, fill #3

## 2022-11-14 ENCOUNTER — Other Ambulatory Visit: Payer: Self-pay

## 2022-12-01 ENCOUNTER — Other Ambulatory Visit: Payer: Self-pay

## 2022-12-02 ENCOUNTER — Other Ambulatory Visit: Payer: Self-pay

## 2022-12-09 ENCOUNTER — Ambulatory Visit: Payer: Self-pay | Admitting: Family Medicine

## 2022-12-16 ENCOUNTER — Other Ambulatory Visit: Payer: Self-pay

## 2022-12-18 ENCOUNTER — Other Ambulatory Visit: Payer: Self-pay

## 2022-12-30 ENCOUNTER — Other Ambulatory Visit: Payer: Self-pay

## 2022-12-30 ENCOUNTER — Ambulatory Visit (INDEPENDENT_AMBULATORY_CARE_PROVIDER_SITE_OTHER): Payer: Self-pay | Admitting: Family Medicine

## 2022-12-30 VITALS — BP 105/63 | HR 69 | Temp 97.3°F | Ht 69.0 in | Wt 184.2 lb

## 2022-12-30 DIAGNOSIS — I10 Essential (primary) hypertension: Secondary | ICD-10-CM

## 2022-12-30 DIAGNOSIS — F17200 Nicotine dependence, unspecified, uncomplicated: Secondary | ICD-10-CM

## 2022-12-30 DIAGNOSIS — E1149 Type 2 diabetes mellitus with other diabetic neurological complication: Secondary | ICD-10-CM

## 2022-12-30 LAB — POCT GLYCOSYLATED HEMOGLOBIN (HGB A1C): Hemoglobin A1C: 6.5 % — AB (ref 4.0–5.6)

## 2022-12-30 MED ORDER — SALINE SPRAY 0.65 % NA SOLN
1.0000 | NASAL | 0 refills | Status: AC | PRN
Start: 2022-12-30 — End: ?
  Filled 2022-12-30: qty 44, fill #0

## 2022-12-30 NOTE — Progress Notes (Signed)
Established Patient Office Visit  Subjective   Patient ID: Julian Atkinson, male    DOB: Jul 07, 1966  Age: 57 y.o. MRN: [id number]  Chief Complaint  Patient presents with   Diabetes    Follow up    Julian Atkinson is a very pleasant 57 year old male with a medical history significant for type 2 diabetes mellitus, tobacco dependence, and hyperlipidemia that presents for follow-up of his chronic conditions.  Patient says that he has been doing very well and is without complaint on today.  He has been following a carbohydrate modified diet over small meals throughout the day and remains active throughout the week. He denies any headache, blurred vision, polyuria, polydipsia, or polyphasia.  Patient has been taking all prescribed medications consistently.    Patient Active Problem List   Diagnosis Date Noted   Chronic obstructive pulmonary disease (HCC) 06/01/2017   Diabetes mellitus (HCC) 04/30/2017   Vertigo 04/30/2017   Syncope 04/30/2017   Tobacco dependence 04/30/2017   Dehydration 10/06/2012   High blood pressure 10/06/2012   Lightheadedness 10/05/2012   Tobacco abuse 10/05/2012   Nausea 10/05/2012   Past Medical History:  Diagnosis Date   Diabetes mellitus without complication (HCC)    GERD (gastroesophageal reflux disease)    Hypertension    Migraines    Renal disorder    No past surgical history on file. Social History   Tobacco Use   Smoking status: Every Day    Packs/day: 1.50    Years: 30.00    Additional pack years: 0.00    Total pack years: 45.00    Types: Cigarettes   Smokeless tobacco: Never  Vaping Use   Vaping Use: Never used  Substance Use Topics   Alcohol use: Yes    Comment: occ   Drug use: Not Currently    Types: Marijuana    Comment: one or twice a week   Social History   Socioeconomic History   Marital status: Divorced    Spouse name: Not on file   Number of children: Not on file   Years of education: Not on file   Highest education  level: Not on file  Occupational History   Not on file  Tobacco Use   Smoking status: Every Day    Packs/day: 1.50    Years: 30.00    Additional pack years: 0.00    Total pack years: 45.00    Types: Cigarettes   Smokeless tobacco: Never  Vaping Use   Vaping Use: Never used  Substance and Sexual Activity   Alcohol use: Yes    Comment: occ   Drug use: Not Currently    Types: Marijuana    Comment: one or twice a week   Sexual activity: Not Currently  Other Topics Concern   Not on file  Social History Narrative   Not on file   Social Determinants of Health   Financial Resource Strain: Not on file  Food Insecurity: Not on file  Transportation Needs: Not on file  Physical Activity: Not on file  Stress: Not on file  Social Connections: Not on file  Intimate Partner Violence: Not on file   No family status information on file.   Family History  Family history unknown: Yes   Allergies  Allergen Reactions   Codeine Nausea And Vomiting   Penicillins Other (See Comments)    Childhood reaction      Review of Systems  Constitutional: Negative.   HENT: Negative.    Respiratory: Negative.  Cardiovascular: Negative.   Gastrointestinal: Negative.   Genitourinary: Negative.   Skin: Negative.   Neurological: Negative.   Endo/Heme/Allergies: Negative.       Objective:     BP 105/63   Pulse 69   Temp (!) 97.3 F (36.3 C)   Ht 5\' 9"  (1.753 m)   Wt 184 lb 3.2 oz (83.6 kg)   SpO2 94%   BMI 27.20 kg/m  BP Readings from Last 3 Encounters:  12/30/22 105/63  09/02/22 111/65  06/03/22 129/77   Wt Readings from Last 3 Encounters:  12/30/22 184 lb 3.2 oz (83.6 kg)  09/02/22 188 lb 9.6 oz (85.5 kg)  06/03/22 201 lb 9.6 oz (91.4 kg)      Physical Exam Constitutional:      Appearance: Normal appearance.  Neurological:     Mental Status: He is alert.      Results for orders placed or performed in visit on 12/30/22  Comprehensive metabolic panel  Result  Value Ref Range   Glucose 102 (H) 70 - 99 mg/dL   BUN 17 6 - 24 mg/dL   Creatinine, Ser 4.09 0.76 - 1.27 mg/dL   eGFR 811 >91 YN/WGN/5.62   BUN/Creatinine Ratio 21 (H) 9 - 20   Sodium 137 134 - 144 mmol/L   Potassium 4.0 3.5 - 5.2 mmol/L   Chloride 99 96 - 106 mmol/L   CO2 24 20 - 29 mmol/L   Calcium 9.6 8.7 - 10.2 mg/dL   Total Protein 7.3 6.0 - 8.5 g/dL   Albumin 4.7 3.8 - 4.9 g/dL   Globulin, Total 2.6 1.5 - 4.5 g/dL   Albumin/Globulin Ratio 1.8    Bilirubin Total 0.6 0.0 - 1.2 mg/dL   Alkaline Phosphatase 46 44 - 121 IU/L   AST 12 0 - 40 IU/L   ALT 13 0 - 44 IU/L  POCT glycosylated hemoglobin (Hb A1C)  Result Value Ref Range   Hemoglobin A1C 6.5 (A) 4.0 - 5.6 %   HbA1c POC (<> result, manual entry)     HbA1c, POC (prediabetic range)     HbA1c, POC (controlled diabetic range)      Last CBC Lab Results  Component Value Date   WBC 8.7 03/03/2018   HGB 16.4 03/03/2018   HCT 47.7 03/03/2018   MCV 90 03/03/2018   MCH 31.1 03/03/2018   RDW 13.1 03/03/2018   PLT 198 04/30/2017   Last metabolic panel Lab Results  Component Value Date   GLUCOSE 102 (H) 12/30/2022   NA 137 12/30/2022   K 4.0 12/30/2022   CL 99 12/30/2022   CO2 24 12/30/2022   BUN 17 12/30/2022   CREATININE 0.80 12/30/2022   EGFR 103 12/30/2022   CALCIUM 9.6 12/30/2022   PROT 7.3 12/30/2022   ALBUMIN 4.7 12/30/2022   LABGLOB 2.6 12/30/2022   AGRATIO 1.8 12/30/2022   BILITOT 0.6 12/30/2022   ALKPHOS 46 12/30/2022   AST 12 12/30/2022   ALT 13 12/30/2022   ANIONGAP 9 10/26/2016   Last lipids Lab Results  Component Value Date   CHOL 134 06/03/2022   HDL 30 (L) 06/03/2022   LDLCALC 73 06/03/2022   TRIG 184 (H) 06/03/2022   CHOLHDL 4.5 06/03/2022   Last hemoglobin A1c Lab Results  Component Value Date   HGBA1C 6.5 (A) 12/30/2022   Last thyroid functions No results found for: "TSH", "T3TOTAL", "T4TOTAL", "THYROIDAB" Last vitamin D No results found for: "25OHVITD2", "25OHVITD3",  "VD25OH" Last vitamin B12 and Folate No results  found for: "VITAMINB12", "FOLATE"    The 10-year ASCVD risk score (Arnett DK, et al., 2019) is: 18.7%    Assessment & Plan:   Problem List Items Addressed This Visit       Endocrine   Diabetes mellitus (HCC) - Primary   Relevant Orders   POCT glycosylated hemoglobin (Hb A1C) (Completed)   Comprehensive metabolic panel (Completed)     Other   Tobacco dependence   Other Visit Diagnoses     Essential hypertension       Relevant Medications   sodium chloride (OCEAN) 0.65 % SOLN nasal spray   Other Relevant Orders   Comprehensive metabolic panel (Completed)     1. Type 2 diabetes mellitus with other neurologic complication, without long-term current use of insulin (HCC) New, patient's hemoglobin A1c is 6.5.  A1c has continued to improve over the past 6 months.  No medication changes are warranted today.  Patient advised to continue carbohydrate modified diet. - POCT glycosylated hemoglobin (Hb A1C) - Comprehensive metabolic panel  2. Essential hypertension  - sodium chloride (OCEAN) 0.65 % SOLN nasal spray; Place 1 spray into both nostrils as needed for congestion.  Dispense: 44 mL; Refill: 0 - Comprehensive metabolic panel  3. Tobacco dependence Smoking cessation instruction/counseling given:  counseled patient on the dangers of tobacco use, advised patient to stop smoking, and reviewed strategies to maximize success    Return in about 6 months (around 07/01/2023) for diabetes.   [name]  APRN, MSN, FNP-C [facility] [address] [place], [place] [address] [phone]

## 2022-12-31 LAB — COMPREHENSIVE METABOLIC PANEL
ALT: 13 IU/L (ref 0–44)
AST: 12 IU/L (ref 0–40)
Albumin/Globulin Ratio: 1.8
Albumin: 4.7 g/dL (ref 3.8–4.9)
Alkaline Phosphatase: 46 IU/L (ref 44–121)
BUN/Creatinine Ratio: 21 — ABNORMAL HIGH (ref 9–20)
BUN: 17 mg/dL (ref 6–24)
Bilirubin Total: 0.6 mg/dL (ref 0.0–1.2)
CO2: 24 mmol/L (ref 20–29)
Calcium: 9.6 mg/dL (ref 8.7–10.2)
Chloride: 99 mmol/L (ref 96–106)
Creatinine, Ser: 0.8 mg/dL (ref 0.76–1.27)
Globulin, Total: 2.6 g/dL (ref 1.5–4.5)
Glucose: 102 mg/dL — ABNORMAL HIGH (ref 70–99)
Potassium: 4 mmol/L (ref 3.5–5.2)
Sodium: 137 mmol/L (ref 134–144)
Total Protein: 7.3 g/dL (ref 6.0–8.5)
eGFR: 103 mL/min/{1.73_m2} (ref >59)

## 2023-01-01 ENCOUNTER — Other Ambulatory Visit: Payer: Self-pay

## 2023-01-15 ENCOUNTER — Encounter: Payer: Self-pay | Admitting: Family Medicine

## 2023-01-16 ENCOUNTER — Other Ambulatory Visit: Payer: Self-pay | Admitting: Family Medicine

## 2023-01-16 ENCOUNTER — Other Ambulatory Visit: Payer: Self-pay

## 2023-01-16 DIAGNOSIS — R42 Dizziness and giddiness: Secondary | ICD-10-CM

## 2023-01-16 DIAGNOSIS — I10 Essential (primary) hypertension: Secondary | ICD-10-CM

## 2023-01-16 DIAGNOSIS — K219 Gastro-esophageal reflux disease without esophagitis: Secondary | ICD-10-CM

## 2023-01-19 ENCOUNTER — Other Ambulatory Visit: Payer: Self-pay

## 2023-01-19 MED ORDER — OMEPRAZOLE 20 MG PO CPDR
20.0000 mg | DELAYED_RELEASE_CAPSULE | Freq: Every day | ORAL | 1 refills | Status: DC
Start: 2023-01-19 — End: 2023-04-03
  Filled 2023-01-19 – 2023-01-26 (×2): qty 30, 30d supply, fill #0
  Filled 2023-03-03: qty 30, 30d supply, fill #1

## 2023-01-19 MED ORDER — MECLIZINE HCL 25 MG PO TABS
25.0000 mg | ORAL_TABLET | Freq: Two times a day (BID) | ORAL | 2 refills | Status: DC | PRN
Start: 2023-01-19 — End: 2023-05-18
  Filled 2023-01-19 – 2023-01-26 (×2): qty 30, 15d supply, fill #0
  Filled 2023-03-03: qty 30, 15d supply, fill #1
  Filled 2023-04-13: qty 30, 15d supply, fill #2

## 2023-01-19 MED ORDER — HYDROCHLOROTHIAZIDE 12.5 MG PO CAPS
12.5000 mg | ORAL_CAPSULE | Freq: Every day | ORAL | 1 refills | Status: DC
Start: 2023-01-19 — End: 2023-04-03
  Filled 2023-01-19 – 2023-01-26 (×2): qty 30, 30d supply, fill #0
  Filled 2023-03-03: qty 30, 30d supply, fill #1

## 2023-01-26 ENCOUNTER — Other Ambulatory Visit: Payer: Self-pay

## 2023-01-27 ENCOUNTER — Other Ambulatory Visit: Payer: Self-pay

## 2023-02-06 ENCOUNTER — Other Ambulatory Visit: Payer: Self-pay

## 2023-02-10 ENCOUNTER — Other Ambulatory Visit: Payer: Self-pay

## 2023-03-03 ENCOUNTER — Other Ambulatory Visit: Payer: Self-pay

## 2023-03-09 ENCOUNTER — Other Ambulatory Visit: Payer: Self-pay

## 2023-03-19 ENCOUNTER — Other Ambulatory Visit: Payer: Self-pay

## 2023-03-25 ENCOUNTER — Other Ambulatory Visit: Payer: Self-pay

## 2023-04-03 ENCOUNTER — Other Ambulatory Visit: Payer: Self-pay

## 2023-04-03 ENCOUNTER — Other Ambulatory Visit: Payer: Self-pay | Admitting: Family Medicine

## 2023-04-03 DIAGNOSIS — K219 Gastro-esophageal reflux disease without esophagitis: Secondary | ICD-10-CM

## 2023-04-03 DIAGNOSIS — E781 Pure hyperglyceridemia: Secondary | ICD-10-CM

## 2023-04-03 DIAGNOSIS — I10 Essential (primary) hypertension: Secondary | ICD-10-CM

## 2023-04-03 MED ORDER — HYDROCHLOROTHIAZIDE 12.5 MG PO CAPS
12.5000 mg | ORAL_CAPSULE | Freq: Every day | ORAL | 1 refills | Status: DC
  Filled 2023-04-03: qty 30, 30d supply, fill #0
  Filled 2023-04-10: qty 90, 90d supply, fill #0
  Filled 2023-07-13: qty 90, 90d supply, fill #1

## 2023-04-03 MED ORDER — OMEPRAZOLE 20 MG PO CPDR
20.0000 mg | DELAYED_RELEASE_CAPSULE | Freq: Every day | ORAL | 1 refills | Status: DC
Start: 2023-04-03 — End: 2023-10-05
  Filled 2023-04-03: qty 30, 30d supply, fill #0
  Filled 2023-04-10: qty 90, 90d supply, fill #0
  Filled 2023-07-13: qty 90, 90d supply, fill #1

## 2023-04-03 MED ORDER — ROSUVASTATIN CALCIUM 20 MG PO TABS
20.0000 mg | ORAL_TABLET | Freq: Every day | ORAL | 1 refills | Status: DC
  Filled 2023-04-03: qty 30, 30d supply, fill #0
  Filled 2023-04-10: qty 90, 90d supply, fill #0
  Filled 2023-07-13: qty 90, 90d supply, fill #1

## 2023-04-10 ENCOUNTER — Other Ambulatory Visit: Payer: Self-pay

## 2023-04-13 ENCOUNTER — Other Ambulatory Visit: Payer: Self-pay

## 2023-04-14 ENCOUNTER — Other Ambulatory Visit: Payer: Self-pay

## 2023-05-13 ENCOUNTER — Other Ambulatory Visit: Payer: Self-pay

## 2023-05-18 ENCOUNTER — Other Ambulatory Visit: Payer: Self-pay

## 2023-05-18 ENCOUNTER — Other Ambulatory Visit: Payer: Self-pay | Admitting: Family Medicine

## 2023-05-18 DIAGNOSIS — R42 Dizziness and giddiness: Secondary | ICD-10-CM

## 2023-05-18 MED ORDER — MECLIZINE HCL 25 MG PO TABS
25.0000 mg | ORAL_TABLET | Freq: Two times a day (BID) | ORAL | 2 refills | Status: DC | PRN
  Filled 2023-05-18: qty 30, 15d supply, fill #0
  Filled 2023-06-17 (×2): qty 30, 15d supply, fill #1
  Filled 2023-07-13: qty 30, 15d supply, fill #2

## 2023-05-19 ENCOUNTER — Other Ambulatory Visit: Payer: Self-pay

## 2023-05-19 DIAGNOSIS — E1149 Type 2 diabetes mellitus with other diabetic neurological complication: Secondary | ICD-10-CM

## 2023-05-19 MED ORDER — EMPAGLIFLOZIN 10 MG PO TABS
10.0000 mg | ORAL_TABLET | Freq: Every day | ORAL | 1 refills | Status: DC
  Filled 2023-05-19: qty 90, 90d supply, fill #0
  Filled 2023-07-13: qty 30, 30d supply, fill #0
  Filled 2023-08-24: qty 30, 30d supply, fill #1

## 2023-06-17 ENCOUNTER — Other Ambulatory Visit (HOSPITAL_COMMUNITY): Payer: Self-pay

## 2023-06-17 ENCOUNTER — Other Ambulatory Visit: Payer: Self-pay

## 2023-06-30 ENCOUNTER — Other Ambulatory Visit: Payer: Self-pay

## 2023-06-30 ENCOUNTER — Encounter: Payer: Self-pay | Admitting: Nurse Practitioner

## 2023-06-30 ENCOUNTER — Ambulatory Visit (INDEPENDENT_AMBULATORY_CARE_PROVIDER_SITE_OTHER): Payer: Self-pay | Admitting: Nurse Practitioner

## 2023-06-30 VITALS — BP 114/58 | HR 62 | Temp 97.2°F | Wt 193.4 lb

## 2023-06-30 DIAGNOSIS — J449 Chronic obstructive pulmonary disease, unspecified: Secondary | ICD-10-CM

## 2023-06-30 DIAGNOSIS — E781 Pure hyperglyceridemia: Secondary | ICD-10-CM | POA: Insufficient documentation

## 2023-06-30 DIAGNOSIS — J309 Allergic rhinitis, unspecified: Secondary | ICD-10-CM | POA: Insufficient documentation

## 2023-06-30 DIAGNOSIS — R053 Chronic cough: Secondary | ICD-10-CM | POA: Insufficient documentation

## 2023-06-30 DIAGNOSIS — F17200 Nicotine dependence, unspecified, uncomplicated: Secondary | ICD-10-CM

## 2023-06-30 DIAGNOSIS — I10 Essential (primary) hypertension: Secondary | ICD-10-CM

## 2023-06-30 DIAGNOSIS — E1149 Type 2 diabetes mellitus with other diabetic neurological complication: Secondary | ICD-10-CM

## 2023-06-30 DIAGNOSIS — E785 Hyperlipidemia, unspecified: Secondary | ICD-10-CM | POA: Insufficient documentation

## 2023-06-30 MED ORDER — LORATADINE 10 MG PO TABS
10.0000 mg | ORAL_TABLET | Freq: Every day | ORAL | 5 refills | Status: DC
Start: 2023-06-30 — End: 2024-03-28
  Filled 2023-06-30: qty 30, 30d supply, fill #0
  Filled 2023-08-24: qty 30, 30d supply, fill #1
  Filled 2023-10-05: qty 30, 30d supply, fill #2
  Filled 2023-11-10: qty 30, 30d supply, fill #3
  Filled 2023-12-17 (×2): qty 30, 30d supply, fill #4
  Filled 2024-02-01: qty 30, 30d supply, fill #5

## 2023-06-30 MED ORDER — BENZONATATE 100 MG PO CAPS
100.0000 mg | ORAL_CAPSULE | Freq: Three times a day (TID) | ORAL | 0 refills | Status: DC
  Filled 2023-06-30: qty 21, 7d supply, fill #0

## 2023-06-30 NOTE — Assessment & Plan Note (Signed)
Lab Results  Component Value Date   CHOL 134 06/03/2022   HDL 30 (L) 06/03/2022   LDLCALC 73 06/03/2022   TRIG 184 (H) 06/03/2022   CHOLHDL 4.5 06/03/2022  Currently on Crestor 20 mg daily, patient is not fasting we will check direct LDL LDL goal is less than 70

## 2023-06-30 NOTE — Progress Notes (Signed)
New Patient Office Visit  Subjective:  Patient ID: Julian Atkinson, male    DOB: [birth year]  Age: 57 y.o. MRN: [id number]  CC:  Chief Complaint  Patient presents with   Diabetes    Follow up     HPI Julian Atkinson is a 57 y.o. male  has a past medical history of Chronic obstructive pulmonary disease (HCC) (06/01/2017), Diabetes mellitus without complication (HCC), GERD (gastroesophageal reflux disease), High blood pressure (10/06/2012), Hypertension, Migraines, Renal disorder, and Tobacco dependence (04/30/2017).  Patient presents for follow-up for his chronic medical conditions, patient has been followed by Aliene Altes [place] NP.  Type 2 diabetes.  Currently on Jardiance 10 mg daily, metformin 1000 mg twice daily.  Stated that he has been following a low-carb diet at home.  Does not monitor his blood sugars signs of hypoglycemia reviewed.  Patient has not noticed any signs of hypoglycemia.  Takes rosuvastatin 20 mg daily and fenofibrate 145 mg daily for hyperlipidemia.  Due for diabetic eye exam but patient is uninsured he declined referral.   Tobacco use disorder.  Smokes 1 and half pack of cigarettes daily started smoking at age 7, has chronic productive cough, intermittent wheezing no shortness of breath.  Uses albuterol inhaler as needed for COPD  Allergic rhinitis.  Patient complains of sneezing, runny nose, stuffy nose, uses Flonase nasal spray as needed, stated that he has been having to use Flonase more than once daily.  We discussed starting Claritin 10 mg daily  Patient declined colon cancer screening,     Past Medical History:  Diagnosis Date   Chronic obstructive pulmonary disease (HCC) 06/01/2017   Diabetes mellitus without complication (HCC)    GERD (gastroesophageal reflux disease)    High blood pressure 10/06/2012   Hypertension    Migraines    Renal disorder    Tobacco dependence 04/30/2017    History reviewed. No pertinent surgical history.  Family  History  Family history unknown: Yes    Social History   Socioeconomic History   Marital status: Divorced    Spouse name: Not on file   Number of children: 5   Years of education: Not on file   Highest education level: Not on file  Occupational History   Not on file  Tobacco Use   Smoking status: Every Day    Current packs/day: 1.50    Average packs/day: 1.5 packs/day for 30.0 years (45.0 ttl pk-yrs)    Types: Cigarettes   Smokeless tobacco: Never  Vaping Use   Vaping status: Never Used  Substance and Sexual Activity   Alcohol use: Yes    Comment: occ   Drug use: Not Currently    Types: Marijuana    Comment: one or twice a week   Sexual activity: Not Currently  Other Topics Concern   Not on file  Social History Narrative   Lives home alone   Social Determinants of Health   Financial Resource Strain: Not on file  Food Insecurity: Not on file  Transportation Needs: Not on file  Physical Activity: Not on file  Stress: Not on file  Social Connections: Not on file  Intimate Partner Violence: Not on file    ROS Review of Systems  Constitutional:  Negative for appetite change, chills, fatigue and fever.  HENT:  Positive for congestion, rhinorrhea and sneezing. Negative for postnasal drip.   Respiratory:  Positive for cough. Negative for shortness of breath and wheezing.   Cardiovascular:  Negative for chest pain,  palpitations and leg swelling.  Gastrointestinal:  Negative for abdominal pain, constipation, nausea and vomiting.  Genitourinary:  Negative for difficulty urinating, dysuria, flank pain and frequency.  Musculoskeletal:  Negative for arthralgias, back pain, joint swelling and myalgias.  Skin:  Negative for color change, pallor, rash and wound.  Neurological:  Negative for dizziness, facial asymmetry, weakness, numbness and headaches.  Psychiatric/Behavioral:  Negative for behavioral problems, confusion, self-injury and suicidal ideas.     Objective:    Today's Vitals: BP (!) 114/58   Pulse 62   Temp (!) 97.2 F (36.2 C)   Wt 193 lb 6.4 oz (87.7 kg)   SpO2 96%   BMI 28.56 kg/m   Physical Exam Vitals and nursing note reviewed.  Constitutional:      General: He is not in acute distress.    Appearance: Normal appearance. He is not ill-appearing, toxic-appearing or diaphoretic.  HENT:     Right Ear: Tympanic membrane, ear canal and external ear normal. There is no impacted cerumen.     Left Ear: Tympanic membrane, ear canal and external ear normal. There is no impacted cerumen.     Nose: No congestion or rhinorrhea.     Mouth/Throat:     Mouth: Mucous membranes are moist.     Pharynx: Oropharynx is clear. No oropharyngeal exudate or posterior oropharyngeal erythema.  Eyes:     General: No scleral icterus.       Right eye: No discharge.        Left eye: No discharge.     Extraocular Movements: Extraocular movements intact.     Conjunctiva/sclera: Conjunctivae normal.  Cardiovascular:     Rate and Rhythm: Normal rate and regular rhythm.     Pulses: Normal pulses.     Heart sounds: Normal heart sounds. No murmur heard.    No friction rub. No gallop.  Pulmonary:     Effort: Pulmonary effort is normal. No respiratory distress.     Breath sounds: No stridor. No wheezing, rhonchi or rales.     Comments: Diminished lung sounds bilaterally Chest:     Chest wall: No tenderness.  Abdominal:     General: There is no distension.     Palpations: Abdomen is soft.     Tenderness: There is no abdominal tenderness. There is no right CVA tenderness, left CVA tenderness or guarding.  Musculoskeletal:        General: No swelling, tenderness, deformity or signs of injury.     Right lower leg: No edema.     Left lower leg: No edema.  Skin:    General: Skin is warm and dry.     Capillary Refill: Capillary refill takes less than 2 seconds.     Coloration: Skin is not jaundiced or pale.     Findings: No bruising, erythema or lesion.   Neurological:     Mental Status: He is alert and oriented to person, place, and time.     Motor: No weakness.     Coordination: Coordination normal.     Gait: Gait normal.  Psychiatric:        Mood and Affect: Mood normal.        Behavior: Behavior normal.        Thought Content: Thought content normal.        Judgment: Judgment normal.     Assessment & Plan:   Problem List Items Addressed This Visit       Cardiovascular and Mediastinum   Essential hypertension  BP Readings from Last 3 Encounters:  06/30/23 (!) 114/58  12/30/22 105/63  09/02/22 111/65   HTN Controlled .  On lisinopril 2.5 mg daily, hydrochlorothiazide 12.5 mg daily Continue current medications. No changes in management. Discussed DASH diet and dietary sodium restrictions Continue to increase dietary efforts and exercise.  BMP today        Relevant Orders   Basic metabolic panel     Respiratory   Chronic obstructive pulmonary disease (HCC)    Smoking cessation encouraged Continue albuterol inhaler 2 puffs every 6 hours as needed      Relevant Medications   loratadine (CLARITIN) 10 MG tablet   benzonatate (TESSALON) 100 MG capsule   Allergic rhinitis - Primary    Continue Flonase nasal spray 2 spray daily Start Claritin 10 mg daily Smoking cessation encouraged      Relevant Medications   loratadine (CLARITIN) 10 MG tablet     Endocrine   Diabetes mellitus (HCC)    Chronic medical condition currently well-controlled Continue metformin 1000 mg twice daily, Jardiance 10 mg daily Continue low-carb modified diet Encouraged engage in regular moderate exercise at least 150 minutes weekly Patient declined diabetic eye exam Follow-up in 6 months      Relevant Orders   Lipid panel   Direct LDL   CBC   Basic metabolic panel     Other   Tobacco dependence    Smokes about 1.5 pack/day  Asked about quitting: confirms that he currently smokes cigarettes Advise to quit smoking: Educated  about QUITTING to reduce the risk of cancer, cardio and cerebrovascular disease. Assess willingness: willing to quit at this time, and is working on cutting back. Assist with counseling and pharmacotherapy: Counseled for 5 minutes and literature provided.  Provided Calling the smokefree.gov counselor helpline at 1-800-QUIT-NOW [phone]) to get free nicotine patches Arrange for follow up: follow up in 6 months and continue to offer help.       Hypertriglyceridemia    Continue Crestor 20 mg daily, fenofibrate 145 mg daily Lab Results  Component Value Date   CHOL 134 06/03/2022   HDL 30 (L) 06/03/2022   LDLCALC 73 06/03/2022   TRIG 184 (H) 06/03/2022   CHOLHDL 4.5 06/03/2022  Avoid fatty fried foods Checking lipid panel today      Dyslipidemia, goal LDL below 70    Lab Results  Component Value Date   CHOL 134 06/03/2022   HDL 30 (L) 06/03/2022   LDLCALC 73 06/03/2022   TRIG 184 (H) 06/03/2022   CHOLHDL 4.5 06/03/2022  Currently on Crestor 20 mg daily, patient is not fasting we will check direct LDL LDL goal is less than 70      Chronic cough    Tessalon 100 mg 3 times daily as needed refilled Smoking cessation encouraged      Relevant Medications   benzonatate (TESSALON) 100 MG capsule    Outpatient Encounter Medications as of 06/30/2023  Medication Sig   albuterol (VENTOLIN HFA) 108 (90 Base) MCG/ACT inhaler Inhale 2 puffs into the lungs every 6 (six) hours as needed for wheezing or shortness of breath.   aspirin EC 81 MG tablet Take 1 tablet (81 mg total) by mouth daily.   empagliflozin (JARDIANCE) 10 MG TABS tablet Take 1 tablet (10 mg total) by mouth daily before breakfast.   fenofibrate (TRICOR) 145 MG tablet Take 1 tablet (145 mg total) by mouth daily.   hydrochlorothiazide (MICROZIDE) 12.5 MG capsule Take 1 capsule (12.5 mg total) by  mouth daily.   lisinopril (ZESTRIL) 2.5 MG tablet Take 1 tablet (2.5 mg total) by mouth daily.   loratadine (CLARITIN) 10 MG  tablet Take 1 tablet (10 mg total) by mouth daily.   meclizine (ANTIVERT) 25 MG tablet Take 1 tablet (25 mg total) by mouth 2 (two) times daily as needed.   metFORMIN (GLUCOPHAGE) 1000 MG tablet Take 1 tablet (1,000 mg total) by mouth 2 (two) times daily with a meal.   omeprazole (PRILOSEC) 20 MG capsule TAKE 1 CAPSULE (20 MG TOTAL) BY MOUTH DAILY.   rosuvastatin (CRESTOR) 20 MG tablet Take 1 tablet (20 mg total) by mouth daily.   sodium chloride (OCEAN) 0.65 % SOLN nasal spray Place 1 spray into both nostrils as needed for congestion.   benzonatate (TESSALON) 100 MG capsule Take 1 capsule (100 mg total) by mouth every 8 (eight) hours.   ibuprofen (ADVIL) 800 MG tablet TAKE 1 TABLET BY MOUTH EVERY 6 HOURS; DO NOT EXCEED 3200 MG PER DAY (Patient not taking: Reported on 06/03/2022)   magic mouthwash SOLN  (Patient not taking: Reported on 06/03/2022)   [DISCONTINUED] benzonatate (TESSALON) 100 MG capsule Take 1 capsule (100 mg total) by mouth every 8 (eight) hours. (Patient not taking: Reported on 12/30/2022)   [DISCONTINUED] clindamycin (CLEOCIN) 300 MG capsule TAKE 1 CAPSULE BY MOUTH EVERY 6-8 HOURS UNTIL FINISHED (Patient not taking: Reported on 06/03/2022)   No facility-administered encounter medications on file as of 06/30/2023.    Follow-up: Return in about 6 months (around 12/29/2023) for DM, HTN.   Donell Beers, FNP

## 2023-06-30 NOTE — Assessment & Plan Note (Addendum)
BP Readings from Last 3 Encounters:  06/30/23 (!) 114/58  12/30/22 105/63  09/02/22 111/65   HTN Controlled .  On lisinopril 2.5 mg daily, hydrochlorothiazide 12.5 mg daily Continue current medications. No changes in management. Discussed DASH diet and dietary sodium restrictions Continue to increase dietary efforts and exercise.  BMP today

## 2023-06-30 NOTE — Assessment & Plan Note (Addendum)
Smokes about 1.5 pack/day  Asked about quitting: confirms that he currently smokes cigarettes Advise to quit smoking: Educated about QUITTING to reduce the risk of cancer, cardio and cerebrovascular disease. Assess willingness: willing to quit at this time, and is working on cutting back. Assist with counseling and pharmacotherapy: Counseled for 5 minutes and literature provided.  Provided Calling the smokefree.gov counselor helpline at 1-800-QUIT-NOW (480)538-2544) to get free nicotine patches Arrange for follow up: follow up in 6 months and continue to offer help.

## 2023-06-30 NOTE — Assessment & Plan Note (Signed)
Continue Flonase nasal spray 2 spray daily Start Claritin 10 mg daily Smoking cessation encouraged

## 2023-06-30 NOTE — Assessment & Plan Note (Signed)
Chronic medical condition currently well-controlled Continue metformin 1000 mg twice daily, Jardiance 10 mg daily Continue low-carb modified diet Encouraged engage in regular moderate exercise at least 150 minutes weekly Patient declined diabetic eye exam Follow-up in 6 months

## 2023-06-30 NOTE — Assessment & Plan Note (Signed)
Smoking cessation encouraged Continue albuterol inhaler 2 puffs every 6 hours as needed

## 2023-06-30 NOTE — Assessment & Plan Note (Signed)
Tessalon 100 mg 3 times daily as needed refilled Smoking cessation encouraged

## 2023-06-30 NOTE — Assessment & Plan Note (Signed)
Continue Crestor 20 mg daily, fenofibrate 145 mg daily Lab Results  Component Value Date   CHOL 134 06/03/2022   HDL 30 (L) 06/03/2022   LDLCALC 73 06/03/2022   TRIG 184 (H) 06/03/2022   CHOLHDL 4.5 06/03/2022  Avoid fatty fried foods Checking lipid panel today

## 2023-06-30 NOTE — Patient Instructions (Addendum)
1. Allergic rhinitis, unspecified seasonality, unspecified trigger  - loratadine (CLARITIN) 10 MG tablet; Take 1 tablet (10 mg total) by mouth daily.  Dispense: 30 tablet; Refill: 5  . Chronic bronchitis, unspecified chronic bronchitis type (HCC)  - benzonatate (TESSALON) 100 MG capsule; Take 1 capsule (100 mg total) by mouth every 8 (eight) hours.  Dispense: 21 capsule; Refill: 0    It is important that you exercise regularly at least 30 minutes 5 times a week as tolerated  Think about what you will eat, plan ahead. Choose " clean, green, fresh or frozen" over canned, processed or packaged foods which are more sugary, salty and fatty. 70 to 75% of food eaten should be vegetables and fruit. Three meals at set times with snacks allowed between meals, but they must be fruit or vegetables. Aim to eat over a 12 hour period , example 7 am to 7 pm, and STOP after  your last meal of the day. Drink water,generally about 64 ounces per day, no other drink is as healthy. Fruit juice is best enjoyed in a healthy way, by EATING the fruit.  Thanks for choosing [facility] we consider it a privelige to serve you.

## 2023-07-01 ENCOUNTER — Other Ambulatory Visit: Payer: Self-pay

## 2023-07-01 ENCOUNTER — Other Ambulatory Visit (HOSPITAL_COMMUNITY): Payer: Self-pay | Admitting: Nurse Practitioner

## 2023-07-01 LAB — LIPID PANEL
Chol/HDL Ratio: 4.2 {ratio} (ref 0.0–5.0)
Cholesterol, Total: 134 mg/dL (ref 100–199)
HDL: 32 mg/dL — ABNORMAL LOW (ref >39)
LDL Chol Calc (NIH): 80 mg/dL (ref 0–99)
Triglycerides: 121 mg/dL (ref 0–149)
VLDL Cholesterol Cal: 22 mg/dL (ref 5–40)

## 2023-07-01 LAB — BASIC METABOLIC PANEL
BUN/Creatinine Ratio: 21 — ABNORMAL HIGH (ref 9–20)
BUN: 16 mg/dL (ref 6–24)
CO2: 22 mmol/L (ref 20–29)
Calcium: 9.3 mg/dL (ref 8.7–10.2)
Chloride: 99 mmol/L (ref 96–106)
Creatinine, Ser: 0.77 mg/dL (ref 0.76–1.27)
Glucose: 115 mg/dL — ABNORMAL HIGH (ref 70–99)
Potassium: 4.3 mmol/L (ref 3.5–5.2)
Sodium: 138 mmol/L (ref 134–144)
eGFR: 104 mL/min/{1.73_m2} (ref >59)

## 2023-07-01 LAB — CBC
Hematocrit: 51.2 % — ABNORMAL HIGH (ref 37.5–51.0)
Hemoglobin: 17.1 g/dL (ref 13.0–17.7)
MCH: 31.3 pg (ref 26.6–33.0)
MCHC: 33.4 g/dL (ref 31.5–35.7)
MCV: 94 fL (ref 79–97)
Platelets: 218 10*3/uL (ref 150–450)
RBC: 5.47 x10E6/uL (ref 4.14–5.80)
RDW: 12.7 % (ref 11.6–15.4)
WBC: 9.7 10*3/uL (ref 3.4–10.8)

## 2023-07-01 LAB — LDL CHOLESTEROL, DIRECT: LDL Direct: 71 mg/dL (ref 0–99)

## 2023-07-13 ENCOUNTER — Other Ambulatory Visit: Payer: Self-pay

## 2023-08-24 ENCOUNTER — Other Ambulatory Visit: Payer: Self-pay | Admitting: Nurse Practitioner

## 2023-08-24 ENCOUNTER — Other Ambulatory Visit: Payer: Self-pay

## 2023-08-24 DIAGNOSIS — R42 Dizziness and giddiness: Secondary | ICD-10-CM

## 2023-08-24 MED ORDER — MECLIZINE HCL 25 MG PO TABS
25.0000 mg | ORAL_TABLET | Freq: Two times a day (BID) | ORAL | 2 refills | Status: DC | PRN
  Filled 2023-08-24: qty 30, 15d supply, fill #0
  Filled 2023-10-05: qty 30, 15d supply, fill #1
  Filled 2023-11-13: qty 30, 15d supply, fill #2

## 2023-08-24 NOTE — Telephone Encounter (Signed)
 Please advise [facility]

## 2023-08-25 ENCOUNTER — Other Ambulatory Visit: Payer: Self-pay

## 2023-08-28 ENCOUNTER — Other Ambulatory Visit: Payer: Self-pay

## 2023-08-31 ENCOUNTER — Telehealth: Payer: Self-pay

## 2023-08-31 ENCOUNTER — Other Ambulatory Visit: Payer: Self-pay

## 2023-08-31 NOTE — Telephone Encounter (Signed)
 Patient's enrollment in the [facility] Cares Patient Assistance Foundation for Jardiance  has lapsed. Re-enrollment is in progress and per [employer] may take up to 3 weeks for a decision to be made. Patient states he is out of medication and would like to know what to do in the meantime? He has already utilized his one-per-lifetime one month free trial from [facility] and a discount is not available for this medication at [employer]. Please advise.

## 2023-09-02 ENCOUNTER — Other Ambulatory Visit: Payer: Self-pay | Admitting: Nurse Practitioner

## 2023-09-02 DIAGNOSIS — E1149 Type 2 diabetes mellitus with other diabetic neurological complication: Secondary | ICD-10-CM

## 2023-09-04 ENCOUNTER — Telehealth: Payer: Self-pay

## 2023-09-04 ENCOUNTER — Other Ambulatory Visit: Payer: Self-pay

## 2023-09-04 NOTE — Telephone Encounter (Signed)
Received notification from [employer]) regarding patient assistance DENIAL for [employer].  Proof of income is required per program to complete application processing.  Phone:[phone]

## 2023-09-09 NOTE — Telephone Encounter (Signed)
 Pt was advised and he is working on pa and will come by if he needs the medication. KH

## 2023-10-05 ENCOUNTER — Other Ambulatory Visit: Payer: Self-pay

## 2023-10-05 ENCOUNTER — Other Ambulatory Visit: Payer: Self-pay | Admitting: Family Medicine

## 2023-10-05 DIAGNOSIS — I10 Essential (primary) hypertension: Secondary | ICD-10-CM

## 2023-10-05 DIAGNOSIS — E781 Pure hyperglyceridemia: Secondary | ICD-10-CM

## 2023-10-05 DIAGNOSIS — E1149 Type 2 diabetes mellitus with other diabetic neurological complication: Secondary | ICD-10-CM

## 2023-10-05 DIAGNOSIS — K219 Gastro-esophageal reflux disease without esophagitis: Secondary | ICD-10-CM

## 2023-10-05 MED ORDER — ROSUVASTATIN CALCIUM 20 MG PO TABS
20.0000 mg | ORAL_TABLET | Freq: Every day | ORAL | 1 refills | Status: DC
  Filled 2023-10-05: qty 90, 90d supply, fill #0
  Filled 2024-02-01: qty 90, 90d supply, fill #1

## 2023-10-05 MED ORDER — HYDROCHLOROTHIAZIDE 12.5 MG PO CAPS
12.5000 mg | ORAL_CAPSULE | Freq: Every day | ORAL | 1 refills | Status: DC
  Filled 2023-10-05: qty 90, 90d supply, fill #0
  Filled 2024-02-01: qty 90, 90d supply, fill #1

## 2023-10-05 MED ORDER — OMEPRAZOLE 20 MG PO CPDR
20.0000 mg | DELAYED_RELEASE_CAPSULE | Freq: Every day | ORAL | 1 refills | Status: DC
  Filled 2023-10-05: qty 90, 90d supply, fill #0
  Filled 2024-02-01: qty 90, 90d supply, fill #1

## 2023-10-05 MED ORDER — METFORMIN HCL 1000 MG PO TABS
1000.0000 mg | ORAL_TABLET | Freq: Two times a day (BID) | ORAL | 2 refills | Status: DC
  Filled 2023-10-05: qty 180, 90d supply, fill #0
  Filled 2024-02-01: qty 180, 90d supply, fill #1

## 2023-11-09 ENCOUNTER — Encounter: Payer: Self-pay | Admitting: Nurse Practitioner

## 2023-11-09 ENCOUNTER — Ambulatory Visit (INDEPENDENT_AMBULATORY_CARE_PROVIDER_SITE_OTHER): Payer: Self-pay | Admitting: Nurse Practitioner

## 2023-11-09 ENCOUNTER — Ambulatory Visit: Payer: Self-pay | Admitting: *Deleted

## 2023-11-09 ENCOUNTER — Other Ambulatory Visit: Payer: Self-pay

## 2023-11-09 VITALS — BP 126/63 | HR 66 | Wt 207.0 lb

## 2023-11-09 DIAGNOSIS — J449 Chronic obstructive pulmonary disease, unspecified: Secondary | ICD-10-CM

## 2023-11-09 DIAGNOSIS — E781 Pure hyperglyceridemia: Secondary | ICD-10-CM

## 2023-11-09 DIAGNOSIS — F17200 Nicotine dependence, unspecified, uncomplicated: Secondary | ICD-10-CM

## 2023-11-09 DIAGNOSIS — Z5971 Insufficient health insurance coverage: Secondary | ICD-10-CM | POA: Insufficient documentation

## 2023-11-09 DIAGNOSIS — K219 Gastro-esophageal reflux disease without esophagitis: Secondary | ICD-10-CM | POA: Insufficient documentation

## 2023-11-09 DIAGNOSIS — E1149 Type 2 diabetes mellitus with other diabetic neurological complication: Secondary | ICD-10-CM

## 2023-11-09 DIAGNOSIS — I10 Essential (primary) hypertension: Secondary | ICD-10-CM

## 2023-11-09 DIAGNOSIS — E785 Hyperlipidemia, unspecified: Secondary | ICD-10-CM

## 2023-11-09 LAB — POCT GLYCOSYLATED HEMOGLOBIN (HGB A1C): Hemoglobin A1C: 11.8 % — AB (ref 4.0–5.6)

## 2023-11-09 MED ORDER — ALBUTEROL SULFATE HFA 108 (90 BASE) MCG/ACT IN AERS
2.0000 | INHALATION_SPRAY | Freq: Four times a day (QID) | RESPIRATORY_TRACT | 5 refills | Status: AC | PRN
  Filled 2023-11-09: qty 6.7, 25d supply, fill #0
  Filled 2023-12-17 (×2): qty 6.7, 25d supply, fill #1
  Filled 2024-02-01: qty 6.7, 25d supply, fill #2
  Filled 2024-03-17: qty 6.7, 25d supply, fill #3
  Filled 2024-05-27: qty 6.7, 25d supply, fill #4

## 2023-11-09 MED ORDER — FENOFIBRATE 145 MG PO TABS
145.0000 mg | ORAL_TABLET | Freq: Every day | ORAL | 1 refills | Status: DC
  Filled 2023-11-09: qty 90, 90d supply, fill #0
  Filled 2024-02-01: qty 90, 90d supply, fill #1

## 2023-11-09 MED ORDER — LISINOPRIL 2.5 MG PO TABS
2.5000 mg | ORAL_TABLET | Freq: Every day | ORAL | 1 refills | Status: DC
  Filled 2023-11-09: qty 90, 90d supply, fill #0
  Filled 2024-02-01: qty 90, 90d supply, fill #1

## 2023-11-09 MED ORDER — GLIPIZIDE 5 MG PO TABS
5.0000 mg | ORAL_TABLET | Freq: Every day | ORAL | 1 refills | Status: DC
  Filled 2023-11-09: qty 90, 90d supply, fill #0
  Filled 2024-02-01: qty 90, 90d supply, fill #1

## 2023-11-09 NOTE — Assessment & Plan Note (Signed)
 Lab Results  Component Value Date   CHOL 134 06/30/2023   HDL 32 (L) 06/30/2023   LDLCALC 80 06/30/2023   LDLDIRECT 71 06/30/2023   TRIG 121 06/30/2023   CHOLHDL 4.2 06/30/2023  LDL goal is less than 70 Checking direct LDL On rosuvastatin  20 mg daily

## 2023-11-09 NOTE — Patient Instructions (Addendum)
 Please consider getting Shingrix and Pneumococcal vaccine at local pharmacy.   Goal for fasting blood sugar ranges from 80 to 120 and 2 hours after any meal or at bedtime should be between 130 to 170.   1. Type 2 diabetes mellitus with other neurologic complication, without long-term current use of insulin (HCC) (Primary)  - POCT glycosylated hemoglobin (Hb A1C) - Microalbumin/Creatinine Ratio, Urine - Ambulatory referral to Ophthalmology - Direct LDL - Basic Metabolic Panel - glipiZIDE  (GLUCOTROL ) 5 MG tablet; Take 1 tablet (5 mg total) by mouth daily before breakfast.  Dispense: 90 tablet; Refill: 1     It is important that you exercise regularly at least 30 minutes 5 times a week as tolerated  Think about what you will eat, plan ahead. Choose " clean, green, fresh or frozen" over canned, processed or packaged foods which are more sugary, salty and fatty. 70 to 75% of food eaten should be vegetables and fruit. Three meals at set times with snacks allowed between meals, but they must be fruit or vegetables. Aim to eat over a 12 hour period , example 7 am to 7 pm, and STOP after  your last meal of the day. Drink water,generally about 64 ounces per day, no other drink is as healthy. Fruit juice is best enjoyed in a healthy way, by EATING the fruit.  Thanks for choosing [facility] we consider it a privelige to serve you.

## 2023-11-09 NOTE — Assessment & Plan Note (Signed)
 Has quit smoking cigarettes Patient congratulated on his efforts at smoking cessation Encouraged to continue to abstain from smoking

## 2023-11-09 NOTE — Assessment & Plan Note (Signed)
 Controlled on omeprazole  20 mg daily Continue current medication

## 2023-11-09 NOTE — Assessment & Plan Note (Signed)
 Lab Results  Component Value Date   CHOL 134 06/30/2023   HDL 32 (L) 06/30/2023   LDLCALC 80 06/30/2023   LDLDIRECT 71 06/30/2023   TRIG 121 06/30/2023   CHOLHDL 4.2 06/30/2023  Continue fenofibrate  145 mg daily

## 2023-11-09 NOTE — Telephone Encounter (Signed)
  Chief Complaint: elevated glucose Symptoms: dizziness, fatigue, frequency- urine Frequency: patient states she missed several days of his metformin - but has restarted and had- at least 2 doses.   Disposition: [] ED /[] Urgent Care (no appt availability in office) / [x] Appointment(In office/virtual)/ []  [facility]/ [] Home Care/ [] Refused Recommended Disposition /[]  Mobile Bus/ []  Follow-up with PCP Additional Notes: Appointment scheduled    Copied from CRM [id number]. Topic: Clinical - Red Word Triage >> Nov 09, 2023 11:36 AM Lorrane Rosette wrote: Red Word that prompted transfer to Nurse Triage: feeling dizzy Reason for Disposition  [1] Blood glucose > 300 mg/dL (13.2 mmol/L) AND [4] two or more times in a row  Answer Assessment - Initial Assessment Questions 1. BLOOD GLUCOSE: "What is your blood glucose level?"      [phone] 2. ONSET: "When did you check the blood glucose?"     today 3. USUAL RANGE: "What is your glucose level usually?" (e.g., usual fasting morning value, usual evening value)     Patient does not check glucose 4. KETONES: "Do you check for ketones (urine or blood test strips)?" If Yes, ask: "What does the test show now?"      na 5. TYPE 1 or 2:  "Do you know what type of diabetes you have?"  (e.g., Type 1, Type 2, Gestational; doesn't know)      Type 2 6. INSULIN: "Do you take insulin?" "What type of insulin(s) do you use? What is the mode of delivery? (syringe, pen; injection or pump)?"      no 7. DIABETES PILLS: "Do you take any pills for your diabetes?" If Yes, ask: "Have you missed taking any pills recently?"     Metformin - missed dosing this weekend- restarted yesterday evening 8. OTHER SYMPTOMS: "Do you have any symptoms?" (e.g., fever, frequent urination, difficulty breathing, dizziness, weakness, vomiting)     Dizziness- not bad, feels drained  Protocols used: Diabetes - High Blood Sugar-A-AH

## 2023-11-09 NOTE — Progress Notes (Signed)
 I think atorvastatin  Established Patient Office Visit  Subjective:  Patient ID: Julian Atkinson, male    DOB: [birth year]  Age: 58 y.o. MRN: [id number]  CC:  Chief Complaint  Patient presents with   Diabetes    HPI Julian Atkinson is a 58 y.o. male  has a past medical history of Chronic obstructive pulmonary disease (HCC) (06/01/2017), Diabetes mellitus without complication (HCC), GERD (gastroesophageal reflux disease), High blood pressure (10/06/2012), Hypertension, Migraines, Renal disorder, and Tobacco dependence (04/30/2017).  Patient presents for follow-up for his chronic medical conditions  Type 2 diabetes.  Currently on metformin  1000 mg twice daily, has a prescription for Jardiance   but he cannot afford the medication, proof of income is required to complete patient assistance from twice [employer]  but the patient has not provided the documents needed.  He reports polyuria polydipsia. Stated that he had quit smoking cigarettes after so many years, but after he quit smoking cigarettes ,he has not been paying attention to his diet  History of tobacco use disorder.  Smoked 2 packs of cigarettes daily for more than 43 years and he has recently quit smoking.  No complaints of cough, shortness of breath, wheezing    Past Medical History:  Diagnosis Date   Chronic obstructive pulmonary disease (HCC) 06/01/2017   Diabetes mellitus without complication (HCC)    GERD (gastroesophageal reflux disease)    High blood pressure 10/06/2012   Hypertension    Migraines    Renal disorder    Tobacco dependence 04/30/2017    History reviewed. No pertinent surgical history.  Family History  Family history unknown: Yes    Social History   Socioeconomic History   Marital status: Divorced    Spouse name: Not on file   Number of children: 5   Years of education: Not on file   Highest education level: Not on file  Occupational History   Not on file  Tobacco Use   Smoking status: Former     Current packs/day: 1.50    Average packs/day: 1.5 packs/day for 30.0 years (45.0 ttl pk-yrs)    Types: Cigarettes   Smokeless tobacco: Never  Vaping Use   Vaping status: Never Used  Substance and Sexual Activity   Alcohol use: Yes    Comment: occ   Drug use: Not Currently    Types: Marijuana    Comment: one or twice a week   Sexual activity: Not Currently  Other Topics Concern   Not on file  Social History Narrative   Lives home alone   Social Drivers of Health   Financial Resource Strain: Not on file  Food Insecurity: Not on file  Transportation Needs: Not on file  Physical Activity: Not on file  Stress: Not on file  Social Connections: Not on file  Intimate Partner Violence: Not on file    Outpatient Medications Prior to Visit  Medication Sig Dispense Refill   aspirin  EC 81 MG tablet Take 1 tablet (81 mg total) by mouth daily. 90 tablet 3   hydrochlorothiazide  (MICROZIDE ) 12.5 MG capsule Take 1 capsule (12.5 mg total) by mouth daily. 90 capsule 1   loratadine  (CLARITIN ) 10 MG tablet Take 1 tablet (10 mg total) by mouth daily. 30 tablet 5   metFORMIN  (GLUCOPHAGE ) 1000 MG tablet Take 1 tablet (1,000 mg total) by mouth 2 (two) times daily with a meal. 180 tablet 2   omeprazole  (PRILOSEC) 20 MG capsule TAKE 1 CAPSULE (20 MG TOTAL) BY MOUTH DAILY. 90  capsule 1   rosuvastatin  (CRESTOR ) 20 MG tablet Take 1 tablet (20 mg total) by mouth daily. 90 tablet 1   sodium chloride  (OCEAN) 0.65 % SOLN nasal spray Place 1 spray into both nostrils as needed for congestion. 44 mL 0   albuterol  (VENTOLIN  HFA) 108 (90 Base) MCG/ACT inhaler Inhale 2 puffs into the lungs every 6 (six) hours as needed for wheezing or shortness of breath. 6.7 g 5   fenofibrate  (TRICOR ) 145 MG tablet Take 1 tablet (145 mg total) by mouth daily. 30 tablet 11   lisinopril  (ZESTRIL ) 2.5 MG tablet Take 1 tablet (2.5 mg total) by mouth daily. 30 tablet 11   empagliflozin  (JARDIANCE ) 10 MG TABS tablet Take 1 tablet (10  mg total) by mouth daily before breakfast. (Patient not taking: Reported on 11/09/2023) 90 tablet 1   ibuprofen  (ADVIL ) 800 MG tablet TAKE 1 TABLET BY MOUTH EVERY 6 HOURS; DO NOT EXCEED 3200 MG PER DAY (Patient not taking: Reported on 11/09/2023) 30 tablet 0   meclizine  (ANTIVERT ) 25 MG tablet Take 1 tablet (25 mg total) by mouth 2 (two) times daily as needed. 30 tablet 2   benzonatate  (TESSALON ) 100 MG capsule Take 1 capsule (100 mg total) by mouth every 8 (eight) hours. (Patient not taking: Reported on 11/09/2023) 21 capsule 0   magic mouthwash SOLN  (Patient not taking: Reported on 11/09/2023)     No facility-administered medications prior to visit.    Allergies  Allergen Reactions   Codeine Nausea And Vomiting   Penicillins Other (See Comments)    Childhood reaction    ROS Review of Systems  Constitutional:  Negative for appetite change, chills, fatigue and fever.  HENT:  Negative for congestion, postnasal drip, rhinorrhea and sneezing.   Respiratory:  Negative for cough, shortness of breath and wheezing.   Cardiovascular:  Negative for chest pain, palpitations and leg swelling.  Gastrointestinal:  Negative for abdominal pain, constipation, nausea and vomiting.  Endocrine: Positive for polydipsia and polyuria. Negative for heat intolerance.  Genitourinary:  Negative for difficulty urinating, dysuria, flank pain and frequency.  Musculoskeletal:  Negative for arthralgias, back pain, joint swelling and myalgias.  Skin:  Negative for color change, pallor, rash and wound.  Neurological:  Negative for dizziness, facial asymmetry, weakness, numbness and headaches.  Psychiatric/Behavioral:  Negative for behavioral problems, confusion, self-injury and suicidal ideas.       Objective:    Physical Exam Vitals and nursing note reviewed.  Constitutional:      General: He is not in acute distress.    Appearance: Normal appearance. He is not ill-appearing, toxic-appearing or diaphoretic.   HENT:     Mouth/Throat:     Mouth: Mucous membranes are moist.     Pharynx: Oropharynx is clear. No oropharyngeal exudate or posterior oropharyngeal erythema.  Eyes:     General: No scleral icterus.       Right eye: No discharge.        Left eye: No discharge.     Extraocular Movements: Extraocular movements intact.     Conjunctiva/sclera: Conjunctivae normal.  Cardiovascular:     Rate and Rhythm: Normal rate and regular rhythm.     Pulses: Normal pulses.     Heart sounds: Normal heart sounds. No murmur heard.    No friction rub. No gallop.  Pulmonary:     Effort: Pulmonary effort is normal. No respiratory distress.     Breath sounds: Normal breath sounds. No stridor. No wheezing, rhonchi or rales.  Chest:     Chest wall: No tenderness.  Abdominal:     General: There is no distension.     Palpations: Abdomen is soft.     Tenderness: There is no abdominal tenderness. There is no right CVA tenderness, left CVA tenderness or guarding.  Musculoskeletal:        General: No swelling, tenderness, deformity or signs of injury.     Right lower leg: No edema.     Left lower leg: No edema.  Skin:    General: Skin is warm and dry.     Capillary Refill: Capillary refill takes less than 2 seconds.     Coloration: Skin is not jaundiced or pale.     Findings: No bruising, erythema or lesion.  Neurological:     Mental Status: He is alert and oriented to person, place, and time.     Motor: No weakness.     Coordination: Coordination normal.     Gait: Gait normal.  Psychiatric:        Mood and Affect: Mood normal.        Behavior: Behavior normal.        Thought Content: Thought content normal.        Judgment: Judgment normal.     BP 126/63   Pulse 66   Wt 207 lb (93.9 kg)   SpO2 95%   BMI 30.57 kg/m  Wt Readings from Last 3 Encounters:  11/09/23 207 lb (93.9 kg)  06/30/23 193 lb 6.4 oz (87.7 kg)  12/30/22 184 lb 3.2 oz (83.6 kg)    No results found for: "TSH" Lab Results   Component Value Date   WBC 9.7 06/30/2023   HGB 17.1 06/30/2023   HCT 51.2 (H) 06/30/2023   MCV 94 06/30/2023   PLT 218 06/30/2023   Lab Results  Component Value Date   NA 138 06/30/2023   K 4.3 06/30/2023   CO2 22 06/30/2023   GLUCOSE 115 (H) 06/30/2023   BUN 16 06/30/2023   CREATININE 0.77 06/30/2023   BILITOT 0.6 12/30/2022   ALKPHOS 46 12/30/2022   AST 12 12/30/2022   ALT 13 12/30/2022   PROT 7.3 12/30/2022   ALBUMIN 4.7 12/30/2022   CALCIUM  9.3 06/30/2023   ANIONGAP 9 10/26/2016   EGFR 104 06/30/2023   Lab Results  Component Value Date   CHOL 134 06/30/2023   Lab Results  Component Value Date   HDL 32 (L) 06/30/2023   Lab Results  Component Value Date   LDLCALC 80 06/30/2023   Lab Results  Component Value Date   TRIG 121 06/30/2023   Lab Results  Component Value Date   CHOLHDL 4.2 06/30/2023   Lab Results  Component Value Date   HGBA1C 11.8 (A) 11/09/2023      Assessment & Plan:   Problem List Items Addressed This Visit       Cardiovascular and Mediastinum   Essential hypertension   DASH diet and commitment to daily physical activity for a minimum of 30 minutes discussed and encouraged, as a part of hypertension management. Continue hydrochlorothiazide  12.5 mg daily, lisinopril  2.5 mg daily     11/09/2023    3:04 PM 06/30/2023    8:42 AM 12/30/2022    9:22 AM 09/02/2022    8:54 AM 06/03/2022    9:28 AM 10/08/2021    9:42 AM 05/21/2021    9:47 AM  BP/Weight  Systolic BP 126 114 105 111 129 116 125  Diastolic BP 63  58 63 65 77 81 77  Wt. (Lbs) 207 193.4 184.2 188.6 201.6 213.8 212.03  BMI 30.57 kg/m2 28.56 kg/m2 27.2 kg/m2 27.85 kg/m2 29.77 kg/m2 31.57 kg/m2 31.31 kg/m2           Relevant Medications   lisinopril  (ZESTRIL ) 2.5 MG tablet   fenofibrate  (TRICOR ) 145 MG tablet     Respiratory   Chronic obstructive pulmonary disease (HCC)   Patient congratulated on his efforts at smoking cessation Continue albuterol  inhaler 2 puffs  every 6 hours as needed      Relevant Medications   albuterol  (VENTOLIN  HFA) 108 (90 Base) MCG/ACT inhaler     Digestive   GERD (gastroesophageal reflux disease)   Controlled on omeprazole  20 mg daily Continue current medication        Endocrine   Diabetes mellitus (HCC) - Primary   Lab Results  Component Value Date   HGBA1C 11.8 (A) 11/09/2023  Chronic medical condition currently uncontrolled Not interested in starting insulin Continue metformin  1000 mg twice daily, start glipizide  5 mg daily Patient counseled on low-carb diet, CBG goals discussed, encouraged to monitor blood blood sugar and keep a log Patient referred for diabetic eye exam, checking urine microalbumin labs Follow-up in 4 weeks       Relevant Medications   glipiZIDE  (GLUCOTROL ) 5 MG tablet   lisinopril  (ZESTRIL ) 2.5 MG tablet   Other Relevant Orders   POCT glycosylated hemoglobin (Hb A1C) (Completed)   Microalbumin/Creatinine Ratio, Urine   Ambulatory referral to Ophthalmology   Direct LDL   Basic Metabolic Panel     Other   Tobacco dependence   Has quit smoking cigarettes Patient congratulated on his efforts at smoking cessation Encouraged to continue to abstain from smoking      Hypertriglyceridemia   Lab Results  Component Value Date   CHOL 134 06/30/2023   HDL 32 (L) 06/30/2023   LDLCALC 80 06/30/2023   LDLDIRECT 71 06/30/2023   TRIG 121 06/30/2023   CHOLHDL 4.2 06/30/2023  Continue fenofibrate  145 mg daily      Relevant Medications   lisinopril  (ZESTRIL ) 2.5 MG tablet   fenofibrate  (TRICOR ) 145 MG tablet   Dyslipidemia, goal LDL below 70   Lab Results  Component Value Date   CHOL 134 06/30/2023   HDL 32 (L) 06/30/2023   LDLCALC 80 06/30/2023   LDLDIRECT 71 06/30/2023   TRIG 121 06/30/2023   CHOLHDL 4.2 06/30/2023  LDL goal is less than 70 Checking direct LDL On rosuvastatin  20 mg daily      Relevant Medications   lisinopril  (ZESTRIL ) 2.5 MG tablet   fenofibrate  (TRICOR )  145 MG tablet   Uninsured   Patient encouraged to provide the documents requested to apply for medication assistance for Jardiance , stated that he has not filed his taxes and will provide the documents as soon as he files his taxes       Meds ordered this encounter  Medications   glipiZIDE  (GLUCOTROL ) 5 MG tablet    Sig: Take 1 tablet (5 mg total) by mouth daily before breakfast.    Dispense:  90 tablet    Refill:  1   lisinopril  (ZESTRIL ) 2.5 MG tablet    Sig: Take 1 tablet (2.5 mg total) by mouth daily.    Dispense:  90 tablet    Refill:  1   fenofibrate  (TRICOR ) 145 MG tablet    Sig: Take 1 tablet (145 mg total) by mouth daily.    Dispense:  90 tablet  Refill:  1   albuterol  (VENTOLIN  HFA) 108 (90 Base) MCG/ACT inhaler    Sig: Inhale 2 puffs into the lungs every 6 (six) hours as needed for wheezing or shortness of breath.    Dispense:  6.7 g    Refill:  5    Follow-up: Return in about 4 weeks (around 12/07/2023) for DM.    Kathrin Folden R Baruc Tugwell, FNP

## 2023-11-09 NOTE — Assessment & Plan Note (Signed)
 Patient encouraged to provide the documents requested to apply for medication assistance for Jardiance , stated that he has not filed his taxes and will provide the documents as soon as he files his taxes

## 2023-11-09 NOTE — Assessment & Plan Note (Signed)
 Patient congratulated on his efforts at smoking cessation Continue albuterol  inhaler 2 puffs every 6 hours as needed

## 2023-11-09 NOTE — Assessment & Plan Note (Signed)
 DASH diet and commitment to daily physical activity for a minimum of 30 minutes discussed and encouraged, as a part of hypertension management. Continue hydrochlorothiazide  12.5 mg daily, lisinopril  2.5 mg daily     11/09/2023    3:04 PM 06/30/2023    8:42 AM 12/30/2022    9:22 AM 09/02/2022    8:54 AM 06/03/2022    9:28 AM 10/08/2021    9:42 AM 05/21/2021    9:47 AM  BP/Weight  Systolic BP 126 114 105 111 129 116 125  Diastolic BP 63 58 63 65 77 81 77  Wt. (Lbs) 207 193.4 184.2 188.6 201.6 213.8 212.03  BMI 30.57 kg/m2 28.56 kg/m2 27.2 kg/m2 27.85 kg/m2 29.77 kg/m2 31.57 kg/m2 31.31 kg/m2

## 2023-11-09 NOTE — Assessment & Plan Note (Signed)
 Lab Results  Component Value Date   HGBA1C 11.8 (A) 11/09/2023  Chronic medical condition currently uncontrolled Not interested in starting insulin Continue metformin  1000 mg twice daily, start glipizide  5 mg daily Patient counseled on low-carb diet, CBG goals discussed, encouraged to monitor blood blood sugar and keep a log Patient referred for diabetic eye exam, checking urine microalbumin labs Follow-up in 4 weeks

## 2023-11-10 ENCOUNTER — Other Ambulatory Visit: Payer: Self-pay

## 2023-11-10 LAB — LDL CHOLESTEROL, DIRECT: LDL Direct: 62 mg/dL (ref 0–99)

## 2023-11-10 LAB — BASIC METABOLIC PANEL WITH GFR
BUN/Creatinine Ratio: 18 (ref 9–20)
BUN: 13 mg/dL (ref 6–24)
CO2: 23 mmol/L (ref 20–29)
Calcium: 9.1 mg/dL (ref 8.7–10.2)
Chloride: 99 mmol/L (ref 96–106)
Creatinine, Ser: 0.73 mg/dL — ABNORMAL LOW (ref 0.76–1.27)
Glucose: 268 mg/dL — ABNORMAL HIGH (ref 70–99)
Potassium: 3.8 mmol/L (ref 3.5–5.2)
Sodium: 137 mmol/L (ref 134–144)
eGFR: 105 mL/min/{1.73_m2} (ref >59)

## 2023-11-11 ENCOUNTER — Ambulatory Visit: Payer: Self-pay

## 2023-11-11 LAB — MICROALBUMIN / CREATININE URINE RATIO
Creatinine, Urine: 133 mg/dL
Microalb/Creat Ratio: 14 mg/g{creat} (ref 0–29)
Microalbumin, Urine: 19.1 ug/mL

## 2023-11-11 NOTE — Telephone Encounter (Signed)
 3rd attempt, left voicemail and routing to clinic. Reason for Disposition . Third attempt to contact caller AND no contact made. Phone number verified.  Protocols used: No Contact or Duplicate Contact Call-A-AH

## 2023-11-11 NOTE — Progress Notes (Signed)
 Message was left for patient to return call regarding results. Julian Atkinson

## 2023-11-11 NOTE — Telephone Encounter (Signed)
 2nd attempt, no answer. Left voicemail for patient to return call to  receive lab results.

## 2023-11-11 NOTE — Telephone Encounter (Signed)
 1st attempt to call patient regarding lab results, left voicemail for patient to return call. RMA [name] left a message earlier for patient regarding results.  If patient calls back please provide with results:  Julian R Paseda, FNP 11/11/2023  9:38 AM EDT     Normal labs    Copied from CRM [id number]. Topic: Clinical - Lab/Test Results >> Nov 11, 2023 10:21 AM Julian Atkinson wrote: Reason for CRM: patient returnig call for lab results

## 2023-11-13 ENCOUNTER — Other Ambulatory Visit: Payer: Self-pay

## 2023-11-18 ENCOUNTER — Other Ambulatory Visit: Payer: Self-pay

## 2023-12-17 ENCOUNTER — Other Ambulatory Visit: Payer: Self-pay

## 2023-12-17 ENCOUNTER — Other Ambulatory Visit: Payer: Self-pay | Admitting: Nurse Practitioner

## 2023-12-17 DIAGNOSIS — R42 Dizziness and giddiness: Secondary | ICD-10-CM

## 2023-12-18 ENCOUNTER — Other Ambulatory Visit: Payer: Self-pay

## 2023-12-18 MED ORDER — MECLIZINE HCL 25 MG PO TABS
25.0000 mg | ORAL_TABLET | Freq: Two times a day (BID) | ORAL | 2 refills | Status: DC | PRN
  Filled 2023-12-18: qty 30, 15d supply, fill #0
  Filled 2024-02-01: qty 30, 15d supply, fill #1
  Filled 2024-03-17: qty 30, 15d supply, fill #2

## 2023-12-22 ENCOUNTER — Other Ambulatory Visit: Payer: Self-pay

## 2023-12-23 ENCOUNTER — Ambulatory Visit: Payer: Self-pay | Admitting: Nurse Practitioner

## 2023-12-29 ENCOUNTER — Ambulatory Visit: Payer: Self-pay | Admitting: Nurse Practitioner

## 2024-02-01 ENCOUNTER — Other Ambulatory Visit: Payer: Self-pay

## 2024-02-02 ENCOUNTER — Other Ambulatory Visit: Payer: Self-pay

## 2024-02-03 ENCOUNTER — Ambulatory Visit: Payer: Self-pay | Admitting: Nurse Practitioner

## 2024-03-07 ENCOUNTER — Encounter: Payer: Self-pay | Admitting: Nurse Practitioner

## 2024-03-07 ENCOUNTER — Ambulatory Visit (INDEPENDENT_AMBULATORY_CARE_PROVIDER_SITE_OTHER): Payer: Self-pay | Admitting: Nurse Practitioner

## 2024-03-07 ENCOUNTER — Other Ambulatory Visit: Payer: Self-pay

## 2024-03-07 VITALS — BP 110/69 | HR 85 | Temp 97.3°F | Wt 202.0 lb

## 2024-03-07 DIAGNOSIS — I10 Essential (primary) hypertension: Secondary | ICD-10-CM

## 2024-03-07 DIAGNOSIS — F17200 Nicotine dependence, unspecified, uncomplicated: Secondary | ICD-10-CM

## 2024-03-07 DIAGNOSIS — E1149 Type 2 diabetes mellitus with other diabetic neurological complication: Secondary | ICD-10-CM

## 2024-03-07 DIAGNOSIS — E785 Hyperlipidemia, unspecified: Secondary | ICD-10-CM

## 2024-03-07 DIAGNOSIS — J449 Chronic obstructive pulmonary disease, unspecified: Secondary | ICD-10-CM

## 2024-03-07 LAB — POCT GLYCOSYLATED HEMOGLOBIN (HGB A1C): Hemoglobin A1C: 11.7 % — AB (ref 4.0–5.6)

## 2024-03-07 MED ORDER — GLIPIZIDE ER 5 MG PO TB24
5.0000 mg | ORAL_TABLET | Freq: Every day | ORAL | 1 refills | Status: DC
  Filled 2024-03-07: qty 60, 60d supply, fill #0

## 2024-03-07 MED ORDER — GLIPIZIDE 5 MG PO TABS
5.0000 mg | ORAL_TABLET | Freq: Every day | ORAL | 1 refills | Status: DC
Start: 2024-03-07 — End: 2024-04-26
  Filled 2024-03-07: qty 90, 90d supply, fill #0

## 2024-03-07 NOTE — Assessment & Plan Note (Signed)
 BP Readings from Last 3 Encounters:  03/07/24 110/69  11/09/23 126/63  06/30/23 (!) 114/58   HTN Controlled .  On hydrochlorothiazide  12.5 mg daily, lisinopril  2.5 mg daily Continue current medications. No changes in management. Discussed DASH diet and dietary sodium restrictions Continue to increase dietary efforts and exercise.

## 2024-03-07 NOTE — Assessment & Plan Note (Signed)
 Tried on Crestor  20 mg daily Continue current medication Lab Results  Component Value Date   CHOL 134 06/30/2023   HDL 32 (L) 06/30/2023   LDLCALC 80 06/30/2023   LDLDIRECT 62 11/09/2023   TRIG 121 06/30/2023   CHOLHDL 4.2 06/30/2023

## 2024-03-07 NOTE — Patient Instructions (Addendum)
 Goal for fasting blood sugar ranges from 80 to 120 and 2 hours after any meal or at bedtime should be between 130 to 170.   Please start taking glipizide  5 mg daily.  After 1 week if no hypoglycemia( blood sugar less than 70) please increase glipizide  to 5 mg twice daily.  continue metformin  1000 mg twice daily.  You can utilize a pill box to help remember   I have referred you to our clinical pharmacist for medication assistance for Trulicity  1. Type 2 diabetes mellitus with other neurologic complication, without long-term current use of insulin (HCC) (Primary)  - POCT glycosylated hemoglobin (Hb A1C) - glipiZIDE  (GLUCOTROL  XL) 5 MG 24 hr tablet; Take 1 tablet (5 mg total) by mouth daily with breakfast.  Dispense: 60 tablet; Refill: 1    It is important that you exercise regularly at least 30 minutes 5 times a week as tolerated  Think about what you will eat, plan ahead. Choose  clean, green, fresh or frozen over canned, processed or packaged foods which are more sugary, salty and fatty. 70 to 75% of food eaten should be vegetables and fruit. Three meals at set times with snacks allowed between meals, but they must be fruit or vegetables. Aim to eat over a 12 hour period , example 7 am to 7 pm, and STOP after  your last meal of the day. Drink water,generally about 64 ounces per day, no other drink is as healthy. Fruit juice is best enjoyed in a healthy way, by EATING the fruit.  Thanks for choosing [facility] we consider it a privelige to serve you.

## 2024-03-07 NOTE — Assessment & Plan Note (Signed)
  Recently smoked half a pack after eight months of cessation. Acknowledges need to quit. - Advise to quit smoking.

## 2024-03-07 NOTE — Progress Notes (Signed)
 0  Established Patient Office Visit  Subjective:  Patient ID: Julian Atkinson, male    DOB: 1966-02-12  Age: 58 y.o. MRN: [id number]  CC:  Chief Complaint  Patient presents with   Diabetes    HPI   Discussed the use of AI scribe software for clinical note transcription with the patient, who gave verbal consent to proceed.  History of Present Illness Julian Atkinson is a 58 year old male  has a past medical history of Chronic obstructive pulmonary disease (HCC) (06/01/2017), Diabetes mellitus without complication (HCC), GERD (gastroesophageal reflux disease), High blood pressure (10/06/2012), Hypertension, Migraines, Renal disorder, and Tobacco dependence (04/30/2017). who presents for medication management and follow-up.  He has been experiencing difficulty in consistently taking his prescribed glipizide , often forgetting to take it in the mornings, and has only taken it twice in the past three months. His current A1c is 11.7, slightly decreased from 11.8 three months ago. He takes metformin  1000 mg  in the morning and one at night, and he has been consistent with this regimen. He does not take Jardiance  due to its cost and lack of insurance coverage.  Takes Crestor  20 mg daily for hyperlipidemia.  He has not been able to do a diabetic eye exam due to lack of insurance  His diet is generally good, avoiding soda and consuming unsweetened tea, but he occasionally craves sweets like brownies at night. He stays active by helping a friend with various tasks and doing yard work daily.  He has a history of COPD and uses an albuterol  inhaler as needed, particularly when it is hot outside. He quit smoking for eight months but recently smoked half a pack of cigarettes.    His blood pressure is well-controlled with hydrochlorothiazide  12.5 mg daily, lisinopril  2.5 mg daily   .    Past Medical History:  Diagnosis Date   Chronic obstructive pulmonary disease (HCC) 06/01/2017   Diabetes  mellitus without complication (HCC)    GERD (gastroesophageal reflux disease)    High blood pressure 10/06/2012   Hypertension    Migraines    Renal disorder    Tobacco dependence 04/30/2017    History reviewed. No pertinent surgical history.  Family History  Family history unknown: Yes    Social History   Socioeconomic History   Marital status: Divorced    Spouse name: Not on file   Number of children: 5   Years of education: Not on file   Highest education level: Not on file  Occupational History   Not on file  Tobacco Use   Smoking status: Former    Current packs/day: 1.50    Average packs/day: 1.5 packs/day for 30.0 years (45.0 ttl pk-yrs)    Types: Cigarettes   Smokeless tobacco: Never  Vaping Use   Vaping status: Never Used  Substance and Sexual Activity   Alcohol use: Yes    Comment: occ   Drug use: Not Currently    Types: Marijuana    Comment: one or twice a week   Sexual activity: Not Currently  Other Topics Concern   Not on file  Social History Narrative   Lives home alone   Social Drivers of Health   Financial Resource Strain: Not on file  Food Insecurity: Not on file  Transportation Needs: Not on file  Physical Activity: Not on file  Stress: Not on file  Social Connections: Not on file  Intimate Partner Violence: Not on file    Outpatient Medications Prior to  Visit  Medication Sig Dispense Refill   albuterol  (VENTOLIN  HFA) 108 (90 Base) MCG/ACT inhaler Inhale 2 puffs into the lungs every 6 (six) hours as needed for wheezing or shortness of breath. 6.7 g 5   aspirin  EC 81 MG tablet Take 1 tablet (81 mg total) by mouth daily. 90 tablet 3   fenofibrate  (TRICOR ) 145 MG tablet Take 1 tablet (145 mg total) by mouth daily. 90 tablet 1   hydrochlorothiazide  (MICROZIDE ) 12.5 MG capsule Take 1 capsule (12.5 mg total) by mouth daily. 90 capsule 1   lisinopril  (ZESTRIL ) 2.5 MG tablet Take 1 tablet (2.5 mg total) by mouth daily. 90 tablet 1   loratadine   (CLARITIN ) 10 MG tablet Take 1 tablet (10 mg total) by mouth daily. 30 tablet 5   meclizine  (ANTIVERT ) 25 MG tablet Take 1 tablet (25 mg total) by mouth 2 (two) times daily as needed. 30 tablet 2   metFORMIN  (GLUCOPHAGE ) 1000 MG tablet Take 1 tablet (1,000 mg total) by mouth 2 (two) times daily with a meal. 180 tablet 2   omeprazole  (PRILOSEC) 20 MG capsule TAKE 1 CAPSULE (20 MG TOTAL) BY MOUTH DAILY. 90 capsule 1   rosuvastatin  (CRESTOR ) 20 MG tablet Take 1 tablet (20 mg total) by mouth daily. 90 tablet 1   empagliflozin  (JARDIANCE ) 10 MG TABS tablet Take 1 tablet (10 mg total) by mouth daily before breakfast. (Patient not taking: Reported on 03/07/2024) 90 tablet 1   ibuprofen  (ADVIL ) 800 MG tablet TAKE 1 TABLET BY MOUTH EVERY 6 HOURS; DO NOT EXCEED 3200 MG PER DAY (Patient not taking: Reported on 03/07/2024) 30 tablet 0   sodium chloride  (OCEAN) 0.65 % SOLN nasal spray Place 1 spray into both nostrils as needed for congestion. (Patient not taking: Reported on 03/07/2024) 44 mL 0   glipiZIDE  (GLUCOTROL ) 5 MG tablet Take 1 tablet (5 mg total) by mouth daily before breakfast. (Patient not taking: Reported on 03/07/2024) 90 tablet 1   No facility-administered medications prior to visit.    Allergies  Allergen Reactions   Codeine Nausea And Vomiting   Penicillins Other (See Comments)    Childhood reaction    ROS Review of Systems  Constitutional:  Negative for appetite change, chills, fatigue and fever.  HENT:  Negative for congestion, postnasal drip, rhinorrhea and sneezing.   Respiratory:  Negative for cough, shortness of breath and wheezing.   Cardiovascular:  Negative for chest pain, palpitations and leg swelling.  Gastrointestinal:  Negative for abdominal pain, constipation, nausea and vomiting.  Genitourinary:  Negative for difficulty urinating, dysuria, flank pain and frequency.  Musculoskeletal:  Negative for arthralgias, back pain, joint swelling and myalgias.  Skin:  Negative for  color change, pallor, rash and wound.  Neurological:  Negative for dizziness, facial asymmetry, weakness, numbness and headaches.  Psychiatric/Behavioral:  Negative for behavioral problems, confusion, self-injury and suicidal ideas.       Objective:    Physical Exam Vitals and nursing note reviewed.  Constitutional:      General: He is not in acute distress.    Appearance: Normal appearance. He is obese. He is not ill-appearing, toxic-appearing or diaphoretic.  HENT:     Mouth/Throat:     Mouth: Mucous membranes are moist.     Pharynx: Oropharynx is clear. No oropharyngeal exudate or posterior oropharyngeal erythema.  Eyes:     General: No scleral icterus.       Right eye: No discharge.        Left eye: No discharge.  Extraocular Movements: Extraocular movements intact.     Conjunctiva/sclera: Conjunctivae normal.  Cardiovascular:     Rate and Rhythm: Normal rate and regular rhythm.     Pulses: Normal pulses.     Heart sounds: Normal heart sounds. No murmur heard.    No friction rub. No gallop.  Pulmonary:     Effort: Pulmonary effort is normal. No respiratory distress.     Breath sounds: No stridor. No wheezing, rhonchi or rales.     Comments: Diminished lung sounds bilaterally Chest:     Chest wall: No tenderness.  Abdominal:     General: There is no distension.     Palpations: Abdomen is soft.     Tenderness: There is no abdominal tenderness. There is no right CVA tenderness, left CVA tenderness or guarding.  Musculoskeletal:        General: No swelling, tenderness, deformity or signs of injury.     Right lower leg: No edema.     Left lower leg: No edema.  Skin:    General: Skin is warm and dry.     Capillary Refill: Capillary refill takes less than 2 seconds.     Coloration: Skin is not jaundiced or pale.     Findings: No bruising, erythema or lesion.  Neurological:     Mental Status: He is alert and oriented to person, place, and time.     Motor: No weakness.      Coordination: Coordination normal.     Gait: Gait normal.  Psychiatric:        Mood and Affect: Mood normal.        Behavior: Behavior normal.        Thought Content: Thought content normal.        Judgment: Judgment normal.     BP 110/69   Pulse 85   Temp (!) 97.3 F (36.3 C)   Wt 202 lb (91.6 kg)   SpO2 95%   BMI 29.83 kg/m  Wt Readings from Last 3 Encounters:  03/07/24 202 lb (91.6 kg)  11/09/23 207 lb (93.9 kg)  06/30/23 193 lb 6.4 oz (87.7 kg)    No results found for: TSH Lab Results  Component Value Date   WBC 9.7 06/30/2023   HGB 17.1 06/30/2023   HCT 51.2 (H) 06/30/2023   MCV 94 06/30/2023   PLT 218 06/30/2023   Lab Results  Component Value Date   NA 137 11/09/2023   K 3.8 11/09/2023   CO2 23 11/09/2023   GLUCOSE 268 (H) 11/09/2023   BUN 13 11/09/2023   CREATININE 0.73 (L) 11/09/2023   BILITOT 0.6 12/30/2022   ALKPHOS 46 12/30/2022   AST 12 12/30/2022   ALT 13 12/30/2022   PROT 7.3 12/30/2022   ALBUMIN 4.7 12/30/2022   CALCIUM  9.1 11/09/2023   ANIONGAP 9 10/26/2016   EGFR 105 11/09/2023   Lab Results  Component Value Date   CHOL 134 06/30/2023   Lab Results  Component Value Date   HDL 32 (L) 06/30/2023   Lab Results  Component Value Date   LDLCALC 80 06/30/2023   Lab Results  Component Value Date   TRIG 121 06/30/2023   Lab Results  Component Value Date   CHOLHDL 4.2 06/30/2023   Lab Results  Component Value Date   HGBA1C 11.7 (A) 03/07/2024      Assessment & Plan:  Assessment and Plan Assessment & Plan   Problem List Items Addressed This Visit  Cardiovascular and Mediastinum   Essential hypertension   BP Readings from Last 3 Encounters:  03/07/24 110/69  11/09/23 126/63  06/30/23 (!) 114/58   HTN Controlled .  On hydrochlorothiazide  12.5 mg daily, lisinopril  2.5 mg daily Continue current medications. No changes in management. Discussed DASH diet and dietary sodium restrictions Continue to increase  dietary efforts and exercise.           Respiratory   Chronic obstructive pulmonary disease (HCC)    Occasional albuterol  use provides relief. Relapsed into smoking after eight months of cessation. - Encourage smoking cessation. - Continue albuterol  inhaler as needed.        Endocrine   Diabetes mellitus (HCC) - Primary    Lab Results  Component Value Date   HGBA1C 11.7 (A) 03/07/2024  Type 2 diabetes mellitus, poorly controlled A1c at 11.7 indicates poor control. Non-adherence to glipizide  noted. Discussed Trulicity as an option, no family history of medullary thyroid cancer or multiple endocrine neoplasm disorder. No history of pancreatitis. - Refer to clinical pharmacist for Trulicity assistance.Insulin considered if Trulicity is not feasible. - start glipizide  to 5 mg daily for one week, then 10 mg twice daily if blood sugar not below 70. - Instruct to monitor blood glucose regularly. - Advise dietary modifications to reduce sugar intake. - Encourage regular physical activity.         Relevant Medications   glipiZIDE  (GLUCOTROL ) 5 MG tablet   Other Relevant Orders   POCT glycosylated hemoglobin (Hb A1C) (Completed)   AMB Referral [facility] Care Management     Other   Tobacco dependence    Recently smoked half a pack after eight months of cessation. Acknowledges need to quit. - Advise to quit smoking.        Dyslipidemia, goal LDL below 70   Tried on Crestor  20 mg daily Continue current medication Lab Results  Component Value Date   CHOL 134 06/30/2023   HDL 32 (L) 06/30/2023   LDLCALC 80 06/30/2023   LDLDIRECT 62 11/09/2023   TRIG 121 06/30/2023   CHOLHDL 4.2 06/30/2023          Meds ordered this encounter  Medications   DISCONTD: glipiZIDE  (GLUCOTROL  XL) 5 MG 24 hr tablet    Sig: Take 1 tablet (5 mg total) by mouth daily with breakfast.    Dispense:  60 tablet    Refill:  1   glipiZIDE  (GLUCOTROL ) 5 MG tablet    Sig: Take 1 tablet (5 mg total) by  mouth daily before breakfast.    Dispense:  90 tablet    Refill:  1    Follow-up: Return in about 4 weeks (around 04/04/2024).    Advika Mclelland R Aayliah Rotenberry, FNP

## 2024-03-07 NOTE — Assessment & Plan Note (Signed)
  Occasional albuterol  use provides relief. Relapsed into smoking after eight months of cessation. - Encourage smoking cessation. - Continue albuterol  inhaler as needed.

## 2024-03-07 NOTE — Assessment & Plan Note (Addendum)
  Lab Results  Component Value Date   HGBA1C 11.7 (A) 03/07/2024  Type 2 diabetes mellitus, poorly controlled A1c at 11.7 indicates poor control. Non-adherence to glipizide  noted. Discussed Trulicity as an option, no family history of medullary thyroid cancer or multiple endocrine neoplasm disorder. No history of pancreatitis. - Refer to clinical pharmacist for Trulicity assistance.Insulin considered if Trulicity is not feasible. - start glipizide  to 5 mg daily for one week, then 5 mg twice daily if blood sugar not below 70. - Instruct to monitor blood glucose regularly. - Advise dietary modifications to reduce sugar intake. - Encourage regular physical activity.

## 2024-03-17 ENCOUNTER — Other Ambulatory Visit: Payer: Self-pay

## 2024-03-28 ENCOUNTER — Other Ambulatory Visit: Payer: Self-pay

## 2024-03-28 ENCOUNTER — Other Ambulatory Visit: Payer: Self-pay | Admitting: Nurse Practitioner

## 2024-03-28 DIAGNOSIS — J309 Allergic rhinitis, unspecified: Secondary | ICD-10-CM

## 2024-03-28 MED ORDER — LORATADINE 10 MG PO TABS
10.0000 mg | ORAL_TABLET | Freq: Every day | ORAL | 5 refills | Status: AC
  Filled 2024-03-28 (×2): qty 30, 30d supply, fill #0
  Filled 2024-05-27: qty 30, 30d supply, fill #1
  Filled 2024-06-30: qty 30, 30d supply, fill #2
  Filled 2024-08-01: qty 30, 30d supply, fill #3

## 2024-03-29 ENCOUNTER — Telehealth: Payer: Self-pay | Admitting: *Deleted

## 2024-03-29 NOTE — Progress Notes (Signed)
 Complex Care Management Note  Care Guide Note 03/29/2024 Name: Julian Atkinson MRN: [id number] DOB: [birth year]  Julian Atkinson is a 58 y.o. year old male who sees Paseda, Folashade R, FNP for primary care. I reached out to Julian SHAUNNA Lush by phone today to offer complex care management services.  Mr. Renbarger was given information about Complex Care Management services today including:   The Complex Care Management services include support from the care team which includes your Nurse Care Manager, Clinical Social Worker, or Pharmacist.  The Complex Care Management team is here to help remove barriers to the health concerns and goals most important to you. Complex Care Management services are voluntary, and the patient may decline or stop services at any time by request to their care team member.   Complex Care Management Consent Status: Patient did not agree to participate in complex care management services at this time.  Follow up plan: None   Encounter Outcome:  Patient Refused  Harlene Satterfield  [facility]  [facility], [facility] Guide  Direct Dial: [phone]  Fax [phone]

## 2024-04-06 ENCOUNTER — Other Ambulatory Visit: Payer: Self-pay

## 2024-04-15 ENCOUNTER — Ambulatory Visit: Payer: Self-pay | Admitting: Nurse Practitioner

## 2024-04-26 ENCOUNTER — Ambulatory Visit: Payer: Self-pay | Admitting: Nurse Practitioner

## 2024-04-26 ENCOUNTER — Other Ambulatory Visit: Payer: Self-pay

## 2024-04-26 VITALS — BP 111/69 | HR 64 | Temp 98.1°F | Wt 198.0 lb

## 2024-04-26 DIAGNOSIS — I10 Essential (primary) hypertension: Secondary | ICD-10-CM

## 2024-04-26 DIAGNOSIS — E1149 Type 2 diabetes mellitus with other diabetic neurological complication: Secondary | ICD-10-CM

## 2024-04-26 DIAGNOSIS — E785 Hyperlipidemia, unspecified: Secondary | ICD-10-CM

## 2024-04-26 MED ORDER — GLIPIZIDE 5 MG PO TABS
5.0000 mg | ORAL_TABLET | Freq: Two times a day (BID) | ORAL | 1 refills | Status: DC
  Filled 2024-04-26: qty 90, 45d supply, fill #0
  Filled 2024-06-30: qty 90, 45d supply, fill #1

## 2024-04-26 NOTE — Assessment & Plan Note (Signed)
 Lab Results  Component Value Date   CHOL 134 06/30/2023   HDL 32 (L) 06/30/2023   LDLCALC 80 06/30/2023   LDLDIRECT 62 11/09/2023   TRIG 121 06/30/2023   CHOLHDL 4.2 06/30/2023   Dyslipidemia managed with rosuvastatin . Plan to assess cholesterol levels at next visit. - Continue rosuvastatin  20 mg daily. - Advise fasting before next appointment to check cholesterol levels.

## 2024-04-26 NOTE — Progress Notes (Signed)
 Established Patient Office Visit  Subjective:  Patient ID: Julian Atkinson, male    DOB: 1966/01/23  Age: 58 y.o. MRN: [id number]  CC:  Chief Complaint  Patient presents with   Diabetes    HPI    JADAS 10: --Discussed the use of AI scribe software for clinical note transcription with the patient, who gave verbal consent to proceed.  History of Present Illness KHALEB BROZ is a 58 year old male   has a past medical history of Chronic obstructive pulmonary disease (HCC) (06/01/2017), Diabetes mellitus without complication (HCC), GERD (gastroesophageal reflux disease), High blood pressure (10/06/2012), Hypertension, Migraines, Renal disorder, and Tobacco dependence (04/30/2017).  Patient presents for follow-up for type 2 diabetes   He is currently taking glipizide  5 mg twice daily and metformin  1000 mg twice daily for diabetes management. ,  Stated that he is blood sugar has been stable since since his last visit, occasionally dropping to 70, which he finds acceptable. On one occasion, his blood sugar dropped to 58, which he managed by consuming [employer]. His A1c was previously recorded at 11.7%.  He is also on Crestor  20 mg daily for cholesterol management, lisinopril  2.5 mg, and hydrochlorothiazide  12.5 mg for blood pressure control.  He stays active by taking care of his 66-year-old grandchild. He ate at 5 AM today. No consumption of sugary drinks or foods, stating 'I ain't done no sugar at all.'   Assessment & Plan   Assessment and Plan Assessment & Plan     Past Medical History:  Diagnosis Date   Chronic obstructive pulmonary disease (HCC) 06/01/2017   Diabetes mellitus without complication (HCC)    GERD (gastroesophageal reflux disease)    High blood pressure 10/06/2012   Hypertension    Migraines    Renal disorder    Tobacco dependence 04/30/2017    No past surgical history on file.  Family History  Family history unknown: Yes    Social History    Socioeconomic History   Marital status: Divorced    Spouse name: Not on file   Number of children: 5   Years of education: Not on file   Highest education level: Not on file  Occupational History   Not on file  Tobacco Use   Smoking status: Former    Current packs/day: 1.50    Average packs/day: 1.5 packs/day for 30.0 years (45.0 ttl pk-yrs)    Types: Cigarettes   Smokeless tobacco: Never  Vaping Use   Vaping status: Never Used  Substance and Sexual Activity   Alcohol use: Yes    Comment: occ   Drug use: Not Currently    Types: Marijuana    Comment: one or twice a week   Sexual activity: Not Currently  Other Topics Concern   Not on file  Social History Narrative   Lives home alone   Social Drivers of Health   Financial Resource Strain: Not on file  Food Insecurity: Not on file  Transportation Needs: Not on file  Physical Activity: Not on file  Stress: Not on file  Social Connections: Not on file  Intimate Partner Violence: Not on file    Outpatient Medications Prior to Visit  Medication Sig Dispense Refill   albuterol  (VENTOLIN  HFA) 108 (90 Base) MCG/ACT inhaler Inhale 2 puffs into the lungs every 6 (six) hours as needed for wheezing or shortness of breath. 6.7 g 5   aspirin  EC 81 MG tablet Take 1 tablet (81 mg total) by mouth  daily. 90 tablet 3   fenofibrate  (TRICOR ) 145 MG tablet Take 1 tablet (145 mg total) by mouth daily. 90 tablet 1   hydrochlorothiazide  (MICROZIDE ) 12.5 MG capsule Take 1 capsule (12.5 mg total) by mouth daily. 90 capsule 1   lisinopril  (ZESTRIL ) 2.5 MG tablet Take 1 tablet (2.5 mg total) by mouth daily. 90 tablet 1   metFORMIN  (GLUCOPHAGE ) 1000 MG tablet Take 1 tablet (1,000 mg total) by mouth 2 (two) times daily with a meal. 180 tablet 2   omeprazole  (PRILOSEC) 20 MG capsule TAKE 1 CAPSULE (20 MG TOTAL) BY MOUTH DAILY. 90 capsule 1   rosuvastatin  (CRESTOR ) 20 MG tablet Take 1 tablet (20 mg total) by mouth daily. 90 tablet 1   glipiZIDE   (GLUCOTROL ) 5 MG tablet Take 1 tablet (5 mg total) by mouth daily before breakfast. 90 tablet 1   ibuprofen  (ADVIL ) 800 MG tablet TAKE 1 TABLET BY MOUTH EVERY 6 HOURS; DO NOT EXCEED 3200 MG PER DAY (Patient not taking: Reported on 03/07/2024) 30 tablet 0   loratadine  (CLARITIN ) 10 MG tablet Take 1 tablet (10 mg total) by mouth daily. 30 tablet 5   meclizine  (ANTIVERT ) 25 MG tablet Take 1 tablet (25 mg total) by mouth 2 (two) times daily as needed. 30 tablet 2   sodium chloride  (OCEAN) 0.65 % SOLN nasal spray Place 1 spray into both nostrils as needed for congestion. (Patient not taking: Reported on 03/07/2024) 44 mL 0   empagliflozin  (JARDIANCE ) 10 MG TABS tablet Take 1 tablet (10 mg total) by mouth daily before breakfast. (Patient not taking: Reported on 03/07/2024) 90 tablet 1   No facility-administered medications prior to visit.    Allergies  Allergen Reactions   Codeine Nausea And Vomiting   Penicillins Other (See Comments)    Childhood reaction    ROS Review of Systems  Constitutional:  Negative for appetite change, chills, fatigue and fever.  HENT:  Negative for congestion, postnasal drip, rhinorrhea and sneezing.   Respiratory:  Negative for cough, shortness of breath and wheezing.   Cardiovascular:  Negative for chest pain, palpitations and leg swelling.  Gastrointestinal:  Negative for abdominal pain, constipation, nausea and vomiting.  Genitourinary:  Negative for difficulty urinating, dysuria, flank pain and frequency.  Musculoskeletal:  Negative for arthralgias, back pain, joint swelling and myalgias.  Skin:  Negative for color change, pallor, rash and wound.  Neurological:  Negative for dizziness, facial asymmetry, weakness, numbness and headaches.  Psychiatric/Behavioral:  Negative for behavioral problems, confusion, self-injury and suicidal ideas.       Objective:    Physical Exam Vitals and nursing note reviewed.  Constitutional:      General: He is not in acute  distress.    Appearance: Normal appearance. He is not ill-appearing, toxic-appearing or diaphoretic.  HENT:     Mouth/Throat:     Mouth: Mucous membranes are moist.     Pharynx: Oropharynx is clear. No oropharyngeal exudate or posterior oropharyngeal erythema.  Eyes:     General: No scleral icterus.       Right eye: No discharge.        Left eye: No discharge.     Extraocular Movements: Extraocular movements intact.     Conjunctiva/sclera: Conjunctivae normal.  Cardiovascular:     Rate and Rhythm: Normal rate and regular rhythm.     Pulses: Normal pulses.     Heart sounds: Normal heart sounds. No murmur heard.    No friction rub. No gallop.  Pulmonary:  Effort: Pulmonary effort is normal. No respiratory distress.     Breath sounds: Normal breath sounds. No stridor. No wheezing, rhonchi or rales.  Chest:     Chest wall: No tenderness.  Abdominal:     General: There is no distension.     Palpations: Abdomen is soft.     Tenderness: There is no abdominal tenderness. There is no right CVA tenderness, left CVA tenderness or guarding.  Musculoskeletal:        General: No swelling, tenderness, deformity or signs of injury.     Right lower leg: No edema.     Left lower leg: No edema.  Skin:    General: Skin is warm and dry.     Capillary Refill: Capillary refill takes less than 2 seconds.     Coloration: Skin is not jaundiced or pale.     Findings: No bruising, erythema or lesion.  Neurological:     Mental Status: He is alert and oriented to person, place, and time.     Motor: No weakness.     Gait: Gait normal.  Psychiatric:        Mood and Affect: Mood normal.        Behavior: Behavior normal.        Thought Content: Thought content normal.        Judgment: Judgment normal.     BP 111/69   Pulse 64   Temp 98.1 F (36.7 C)   Wt 198 lb (89.8 kg)   SpO2 96%   BMI 29.24 kg/m  Wt Readings from Last 3 Encounters:  04/26/24 198 lb (89.8 kg)  03/07/24 202 lb (91.6 kg)   11/09/23 207 lb (93.9 kg)    No results found for: TSH Lab Results  Component Value Date   WBC 9.7 06/30/2023   HGB 17.1 06/30/2023   HCT 51.2 (H) 06/30/2023   MCV 94 06/30/2023   PLT 218 06/30/2023   Lab Results  Component Value Date   NA 137 11/09/2023   K 3.8 11/09/2023   CO2 23 11/09/2023   GLUCOSE 268 (H) 11/09/2023   BUN 13 11/09/2023   CREATININE 0.73 (L) 11/09/2023   BILITOT 0.6 12/30/2022   ALKPHOS 46 12/30/2022   AST 12 12/30/2022   ALT 13 12/30/2022   PROT 7.3 12/30/2022   ALBUMIN 4.7 12/30/2022   CALCIUM  9.1 11/09/2023   ANIONGAP 9 10/26/2016   EGFR 105 11/09/2023   Lab Results  Component Value Date   CHOL 134 06/30/2023   Lab Results  Component Value Date   HDL 32 (L) 06/30/2023   Lab Results  Component Value Date   LDLCALC 80 06/30/2023   Lab Results  Component Value Date   TRIG 121 06/30/2023   Lab Results  Component Value Date   CHOLHDL 4.2 06/30/2023   Lab Results  Component Value Date   HGBA1C 11.7 (A) 03/07/2024      Assessment & Plan:   Problem List Items Addressed This Visit       Cardiovascular and Mediastinum   Essential hypertension - Primary   BP Readings from Last 3 Encounters:  04/26/24 111/69  03/07/24 110/69  11/09/23 126/63   Blood pressure 111/69 mmHg. - Continue lisinopril  2.5 mg daily. - Continue hydrochlorothiazide  12.5 mg daily.         Endocrine   Diabetes mellitus (HCC)   Lab Results  Component Value Date   HGBA1C 11.7 (A) 03/07/2024   Type 2 diabetes mellitus with A1c of  11.7%. Blood glucose mostly stable, one hypoglycemia episode at 58 mg/dL managed with [employer]. Goal to reduce A1c to <7% to minimize cardiovascular and renal risks. Discussed high A1c risks including heart attack, stroke, kidney disease. He prefers to avoid insulin. - Continue glipizide  5 mg twice daily with food. - Continue metformin  1000 mg twice daily. - Recheck A1c in two months. - Advise on dietary modifications to  avoid high sugar and high carbohydrate foods. - Encourage physical activity.       Relevant Medications   glipiZIDE  (GLUCOTROL ) 5 MG tablet   Other Relevant Orders   Basic Metabolic Panel     Other   Dyslipidemia, goal LDL below 70   Lab Results  Component Value Date   CHOL 134 06/30/2023   HDL 32 (L) 06/30/2023   LDLCALC 80 06/30/2023   LDLDIRECT 62 11/09/2023   TRIG 121 06/30/2023   CHOLHDL 4.2 06/30/2023   Dyslipidemia managed with rosuvastatin . Plan to assess cholesterol levels at next visit. - Continue rosuvastatin  20 mg daily. - Advise fasting before next appointment to check cholesterol levels.        Meds ordered this encounter  Medications   glipiZIDE  (GLUCOTROL ) 5 MG tablet    Sig: Take 1 tablet (5 mg total) by mouth 2 (two) times daily before a meal.    Dispense:  90 tablet    Refill:  1    Follow-up: Return in about 2 months (around 06/26/2024).    Jaylee Freeze R Jackalyn Haith, FNP

## 2024-04-26 NOTE — Assessment & Plan Note (Addendum)
 Lab Results  Component Value Date   HGBA1C 11.7 (A) 03/07/2024   Type 2 diabetes mellitus with A1c of 11.7%. Blood glucose mostly stable, one hypoglycemia episode at 58 mg/dL managed with [employer]. Goal to reduce A1c to <7% to minimize cardiovascular and renal risks. Discussed high A1c risks including heart attack, stroke, kidney disease. He prefers to avoid insulin. - Continue glipizide  5 mg twice daily with food. - Continue metformin  1000 mg twice daily. - Recheck A1c in two months. - Advise on dietary modifications to avoid high sugar and high carbohydrate foods. - Encourage physical activity.

## 2024-04-26 NOTE — Patient Instructions (Addendum)
 Goal for fasting blood sugar ranges from 80 to 120 and 2 hours after any meal or at bedtime should be between 130 to 170.    It is important that you exercise regularly at least 30 minutes 5 times a week as tolerated  Think about what you will eat, plan ahead. Choose  clean, green, fresh or frozen over canned, processed or packaged foods which are more sugary, salty and fatty. 70 to 75% of food eaten should be vegetables and fruit. Three meals at set times with snacks allowed between meals, but they must be fruit or vegetables. Aim to eat over a 12 hour period , example 7 am to 7 pm, and STOP after  your last meal of the day. Drink water,generally about 64 ounces per day, no other drink is as healthy. Fruit juice is best enjoyed in a healthy way, by EATING the fruit.  Thanks for choosing [facility] we consider it a privelige to serve you.

## 2024-04-26 NOTE — Assessment & Plan Note (Signed)
 BP Readings from Last 3 Encounters:  04/26/24 111/69  03/07/24 110/69  11/09/23 126/63   Blood pressure 111/69 mmHg. - Continue lisinopril  2.5 mg daily. - Continue hydrochlorothiazide  12.5 mg daily.

## 2024-04-27 ENCOUNTER — Ambulatory Visit: Payer: Self-pay | Admitting: Nurse Practitioner

## 2024-04-27 ENCOUNTER — Other Ambulatory Visit: Payer: Self-pay | Admitting: Nurse Practitioner

## 2024-04-27 ENCOUNTER — Other Ambulatory Visit: Payer: Self-pay

## 2024-04-27 DIAGNOSIS — E781 Pure hyperglyceridemia: Secondary | ICD-10-CM

## 2024-04-27 DIAGNOSIS — I10 Essential (primary) hypertension: Secondary | ICD-10-CM

## 2024-04-27 DIAGNOSIS — K219 Gastro-esophageal reflux disease without esophagitis: Secondary | ICD-10-CM

## 2024-04-27 DIAGNOSIS — R42 Dizziness and giddiness: Secondary | ICD-10-CM

## 2024-04-27 LAB — BASIC METABOLIC PANEL WITH GFR
BUN/Creatinine Ratio: 21 — ABNORMAL HIGH (ref 9–20)
BUN: 16 mg/dL (ref 6–24)
CO2: 20 mmol/L (ref 20–29)
Calcium: 9.6 mg/dL (ref 8.7–10.2)
Chloride: 100 mmol/L (ref 96–106)
Creatinine, Ser: 0.75 mg/dL — ABNORMAL LOW (ref 0.76–1.27)
Glucose: 97 mg/dL (ref 70–99)
Potassium: 4 mmol/L (ref 3.5–5.2)
Sodium: 140 mmol/L (ref 134–144)
eGFR: 104 mL/min/1.73 (ref >59)

## 2024-04-27 MED ORDER — ROSUVASTATIN CALCIUM 20 MG PO TABS
20.0000 mg | ORAL_TABLET | Freq: Every day | ORAL | 1 refills | Status: DC
  Filled 2024-04-27: qty 90, 90d supply, fill #0

## 2024-04-27 MED ORDER — LISINOPRIL 2.5 MG PO TABS
2.5000 mg | ORAL_TABLET | Freq: Every day | ORAL | 1 refills | Status: AC
  Filled 2024-04-27: qty 90, 90d supply, fill #0
  Filled 2024-08-01: qty 90, 90d supply, fill #1

## 2024-04-27 MED ORDER — OMEPRAZOLE 20 MG PO CPDR
20.0000 mg | DELAYED_RELEASE_CAPSULE | Freq: Every day | ORAL | 1 refills | Status: AC
  Filled 2024-04-27: qty 90, 90d supply, fill #0
  Filled 2024-08-01: qty 90, 90d supply, fill #1

## 2024-04-27 MED ORDER — HYDROCHLOROTHIAZIDE 12.5 MG PO CAPS
12.5000 mg | ORAL_CAPSULE | Freq: Every day | ORAL | 1 refills | Status: AC
  Filled 2024-04-27: qty 90, 90d supply, fill #0
  Filled 2024-08-01: qty 90, 90d supply, fill #1

## 2024-04-27 MED ORDER — MECLIZINE HCL 25 MG PO TABS
25.0000 mg | ORAL_TABLET | Freq: Two times a day (BID) | ORAL | 2 refills | Status: DC | PRN
  Filled 2024-04-27: qty 30, 15d supply, fill #0
  Filled 2024-05-27: qty 30, 15d supply, fill #1
  Filled 2024-06-30: qty 30, 15d supply, fill #2

## 2024-04-29 ENCOUNTER — Other Ambulatory Visit: Payer: Self-pay

## 2024-05-06 NOTE — Progress Notes (Signed)
 OK for E2C2 to give results

## 2024-05-06 NOTE — Telephone Encounter (Signed)
Left message for patient to call back to discuss results and recommendations. °

## 2024-05-06 NOTE — Telephone Encounter (Signed)
-----   Message from Folashade R Paseda sent at 04/27/2024  8:58 AM EDT ----- Your lab is stable Please continue current medications ----- Message ----- From: Interface, [facility] Lab Results In Sent: 04/27/2024   5:38 AM EDT To: Folashade R Paseda, FNP

## 2024-05-27 ENCOUNTER — Other Ambulatory Visit: Payer: Self-pay

## 2024-05-30 ENCOUNTER — Other Ambulatory Visit: Payer: Self-pay

## 2024-06-14 ENCOUNTER — Other Ambulatory Visit: Payer: Self-pay

## 2024-06-28 ENCOUNTER — Other Ambulatory Visit: Payer: Self-pay

## 2024-06-28 ENCOUNTER — Encounter: Payer: Self-pay | Admitting: Nurse Practitioner

## 2024-06-28 ENCOUNTER — Ambulatory Visit: Payer: Self-pay | Admitting: Nurse Practitioner

## 2024-06-28 VITALS — BP 117/66 | HR 71 | Wt 205.8 lb

## 2024-06-28 DIAGNOSIS — Z0000 Encounter for general adult medical examination without abnormal findings: Secondary | ICD-10-CM | POA: Insufficient documentation

## 2024-06-28 DIAGNOSIS — E781 Pure hyperglyceridemia: Secondary | ICD-10-CM

## 2024-06-28 DIAGNOSIS — F17200 Nicotine dependence, unspecified, uncomplicated: Secondary | ICD-10-CM

## 2024-06-28 DIAGNOSIS — E1165 Type 2 diabetes mellitus with hyperglycemia: Secondary | ICD-10-CM

## 2024-06-28 DIAGNOSIS — E785 Hyperlipidemia, unspecified: Secondary | ICD-10-CM

## 2024-06-28 DIAGNOSIS — J449 Chronic obstructive pulmonary disease, unspecified: Secondary | ICD-10-CM

## 2024-06-28 DIAGNOSIS — I10 Essential (primary) hypertension: Secondary | ICD-10-CM

## 2024-06-28 LAB — POCT GLYCOSYLATED HEMOGLOBIN (HGB A1C)
HbA1c POC (<> result, manual entry): 6.6 % (ref 4.0–5.6)
HbA1c, POC (controlled diabetic range): 6.6 % (ref 0.0–7.0)

## 2024-06-28 MED ORDER — FENOFIBRATE 145 MG PO TABS
145.0000 mg | ORAL_TABLET | Freq: Every day | ORAL | 1 refills | Status: AC
  Filled 2024-06-28: qty 60, 60d supply, fill #0
  Filled 2024-08-23: qty 60, 60d supply, fill #1

## 2024-06-28 MED ORDER — METFORMIN HCL 1000 MG PO TABS
1000.0000 mg | ORAL_TABLET | Freq: Two times a day (BID) | ORAL | 2 refills | Status: AC
  Filled 2024-06-28: qty 180, 90d supply, fill #0

## 2024-06-28 NOTE — Assessment & Plan Note (Addendum)
 Lab Results  Component Value Date   CHOL 134 06/30/2023   HDL 32 (L) 06/30/2023   LDLCALC 80 06/30/2023   LDLDIRECT 62 11/09/2023   TRIG 121 06/30/2023   CHOLHDL 4.2 06/30/2023   - Continue rosuvastatin  20 mg daily.  Checking lipid panel

## 2024-06-28 NOTE — Assessment & Plan Note (Signed)
 Smoking cessation encouraged Continue albuterol  inhaler as needed

## 2024-06-28 NOTE — Patient Instructions (Signed)

## 2024-06-28 NOTE — Assessment & Plan Note (Signed)
  Encouraged vaccines and eye exam for diabetic retinopathy. - Encouraged receiving pneumonia and shingles vaccines. - Recommended annual eye exam for diabetic retinopathy screening.

## 2024-06-28 NOTE — Assessment & Plan Note (Addendum)
 Lab Results  Component Value Date   CHOL 134 06/30/2023   HDL 32 (L) 06/30/2023   LDLCALC 80 06/30/2023   LDLDIRECT 62 11/09/2023   TRIG 121 06/30/2023   CHOLHDL 4.2 06/30/2023    Continue fenofibrate  145 mg daily  Checking lipid panel

## 2024-06-28 NOTE — Assessment & Plan Note (Addendum)
 Lab Results  Component Value Date   HGBA1C 11.7 (A) 03/07/2024   A1c controlled at 6.6%. Occasional hypoglycemia. Current medications: glipizide , metformin . - Continue glipizide  5 mg oral twice daily and metformin  1000 mg oral twice daily. - Encouraged dietary modifications with portion control for fruits. - Advised to report if blood sugar consistently drops below 70 mg/dL for potential adjustment of glipizide  dosage. - Encouraged 30 minutes of moderate exercise five days a week. Written  materials for hypoglycemia provided, advised to take medications with food Need for diabetes eye exam discussed encouraged to get test completed

## 2024-06-28 NOTE — Progress Notes (Signed)
 Established Patient Office Visit  Subjective:  Patient ID: Julian Atkinson, male    DOB: July 11, 1966  Age: 58 y.o. MRN: [id number]  CC:  Chief Complaint  Patient presents with   Follow-up    DM, HYPERTENSION, DYSLIPIDEMIA     HPI   Discussed the use of AI scribe software for clinical note transcription with the patient, who gave verbal consent to proceed.  History of Present Illness Julian Atkinson is a 58 year old male   has a past medical history of Chronic obstructive pulmonary disease (HCC) (06/01/2017), Diabetes mellitus without complication (HCC), GERD (gastroesophageal reflux disease), High blood pressure (10/06/2012), Hypertension, Migraines, Renal disorder, and Tobacco dependence (04/30/2017).  who presents for routine follow-up for his chronic medical conditions.  His blood sugar levels are well-controlled with an A1c of 6.6%. He has experienced two episodes of hypoglycemia, with blood sugar dropping to 58 mg/dL, which he managed by consuming a small piece of sweet. He is currently taking glimepiride 5 mg twice daily and metformin  1000 mg twice daily. He has made significant dietary changes, avoiding bread, pasta, potatoes, and sweets, and is uncertain about consuming fruits due to their sugar content. He mentions that even a small amount of cucumber caused a slight increase in his blood sugar.  For hyperlipidemia, he is taking rosuvastatin  20 mg daily and fenofibrate  145 mg daily. He is also on lisinopril  2.5 mg daily and hydrochlorothiazide  2.5 mg daily for hypertension.  He stays active by chasing his grandchildren and occasionally working in roofing. He feels unwell if he remains inactive, experiencing dizziness.  He smokes a pack of cigarettes daily, having previously quit for eight months. He has COPD and experiences a morning cough. No shortness of breath, wheezing, or other respiratory symptoms. He uses an albuterol  inhaler as needed but did not purchase it last month  due to cost.  He has not yet seen an eye doctor for diabetic retinopathy screening.    Assessment & Plan     Past Medical History:  Diagnosis Date   Chronic obstructive pulmonary disease (HCC) 06/01/2017   Diabetes mellitus without complication (HCC)    GERD (gastroesophageal reflux disease)    High blood pressure 10/06/2012   Hypertension    Migraines    Renal disorder    Tobacco dependence 04/30/2017    No past surgical history on file.  Family History  Family history unknown: Yes    Social History   Socioeconomic History   Marital status: Divorced    Spouse name: Not on file   Number of children: 5   Years of education: Not on file   Highest education level: Not on file  Occupational History   Not on file  Tobacco Use   Smoking status: Every Day    Current packs/day: 1.50    Average packs/day: 1.5 packs/day for 30.0 years (45.0 ttl pk-yrs)    Types: Cigarettes   Smokeless tobacco: Never  Vaping Use   Vaping status: Never Used  Substance and Sexual Activity   Alcohol use: Yes    Comment: occ   Drug use: Not Currently    Types: Marijuana    Comment: one or twice a week   Sexual activity: Not Currently  Other Topics Concern   Not on file  Social History Narrative   Lives home alone   Social Drivers of Health   Financial Resource Strain: Not on file  Food Insecurity: Not on file  Transportation Needs: Not on file  Physical Activity: Not on file  Stress: Not on file  Social Connections: Not on file  Intimate Partner Violence: Not on file    Outpatient Medications Prior to Visit  Medication Sig Dispense Refill   albuterol  (VENTOLIN  HFA) 108 (90 Base) MCG/ACT inhaler Inhale 2 puffs into the lungs every 6 (six) hours as needed for wheezing or shortness of breath. 6.7 g 5   aspirin  EC 81 MG tablet Take 1 tablet (81 mg total) by mouth daily. 90 tablet 3   fenofibrate  (TRICOR ) 145 MG tablet Take 1 tablet (145 mg total) by mouth daily. 90 tablet 1    glipiZIDE  (GLUCOTROL ) 5 MG tablet Take 1 tablet (5 mg total) by mouth 2 (two) times daily before a meal. 90 tablet 1   hydrochlorothiazide  (MICROZIDE ) 12.5 MG capsule Take 1 capsule (12.5 mg total) by mouth daily. 90 capsule 1   lisinopril  (ZESTRIL ) 2.5 MG tablet Take 1 tablet (2.5 mg total) by mouth daily. 90 tablet 1   loratadine  (CLARITIN ) 10 MG tablet Take 1 tablet (10 mg total) by mouth daily. 30 tablet 5   meclizine  (ANTIVERT ) 25 MG tablet Take 1 tablet (25 mg total) by mouth 2 (two) times daily as needed. 30 tablet 2   metFORMIN  (GLUCOPHAGE ) 1000 MG tablet Take 1 tablet (1,000 mg total) by mouth 2 (two) times daily with a meal. 180 tablet 2   omeprazole  (PRILOSEC) 20 MG capsule TAKE 1 CAPSULE (20 MG TOTAL) BY MOUTH DAILY. 90 capsule 1   rosuvastatin  (CRESTOR ) 20 MG tablet Take 1 tablet (20 mg total) by mouth daily. 90 tablet 1   ibuprofen  (ADVIL ) 800 MG tablet TAKE 1 TABLET BY MOUTH EVERY 6 HOURS; DO NOT EXCEED 3200 MG PER DAY (Patient not taking: Reported on 06/28/2024) 30 tablet 0   sodium chloride  (OCEAN) 0.65 % SOLN nasal spray Place 1 spray into both nostrils as needed for congestion. (Patient not taking: Reported on 06/28/2024) 44 mL 0   No facility-administered medications prior to visit.    Allergies  Allergen Reactions   Codeine Nausea And Vomiting   Penicillins Other (See Comments)    Childhood reaction    ROS Review of Systems  Constitutional:  Negative for appetite change, chills, fatigue and fever.  HENT:  Negative for congestion, postnasal drip, rhinorrhea and sneezing.   Respiratory:  Negative for cough, shortness of breath and wheezing.   Cardiovascular:  Negative for chest pain, palpitations and leg swelling.  Gastrointestinal:  Negative for abdominal pain, constipation, nausea and vomiting.  Genitourinary:  Negative for difficulty urinating, dysuria, flank pain and frequency.  Musculoskeletal:  Negative for arthralgias, back pain, joint swelling and myalgias.   Skin:  Negative for color change, pallor, rash and wound.  Neurological:  Negative for dizziness, facial asymmetry, weakness, numbness and headaches.  Psychiatric/Behavioral:  Negative for behavioral problems, confusion, self-injury and suicidal ideas.       Objective:    Physical Exam Vitals and nursing note reviewed.  Constitutional:      General: He is not in acute distress.    Appearance: Normal appearance. He is not ill-appearing, toxic-appearing or diaphoretic.  Eyes:     General: No scleral icterus.       Right eye: No discharge.        Left eye: No discharge.     Extraocular Movements: Extraocular movements intact.     Conjunctiva/sclera: Conjunctivae normal.  Cardiovascular:     Rate and Rhythm: Normal rate and regular rhythm.  Pulses: Normal pulses.     Heart sounds: Normal heart sounds. No murmur heard.    No friction rub. No gallop.  Pulmonary:     Effort: Pulmonary effort is normal. No respiratory distress.     Breath sounds: No stridor. No wheezing, rhonchi or rales.     Comments: Diminished lung sounds Chest:     Chest wall: No tenderness.  Abdominal:     General: There is no distension.     Palpations: Abdomen is soft.     Tenderness: There is no abdominal tenderness. There is no right CVA tenderness, left CVA tenderness or guarding.  Musculoskeletal:        General: No swelling, tenderness, deformity or signs of injury.     Right lower leg: No edema.     Left lower leg: No edema.  Skin:    General: Skin is warm and dry.     Capillary Refill: Capillary refill takes less than 2 seconds.     Coloration: Skin is not jaundiced or pale.     Findings: No bruising, erythema or lesion.  Neurological:     Mental Status: He is alert and oriented to person, place, and time.     Motor: No weakness.     Gait: Gait normal.  Psychiatric:        Mood and Affect: Mood normal.        Behavior: Behavior normal.        Thought Content: Thought content normal.         Judgment: Judgment normal.     BP 117/66 (BP Location: Left Arm, Patient Position: Sitting, Cuff Size: Large)   Pulse 71   Wt 205 lb 12.8 oz (93.4 kg)   SpO2 93%   BMI 30.39 kg/m  Wt Readings from Last 3 Encounters:  06/28/24 205 lb 12.8 oz (93.4 kg)  04/26/24 198 lb (89.8 kg)  03/07/24 202 lb (91.6 kg)    No results found for: TSH Lab Results  Component Value Date   WBC 9.7 06/30/2023   HGB 17.1 06/30/2023   HCT 51.2 (H) 06/30/2023   MCV 94 06/30/2023   PLT 218 06/30/2023   Lab Results  Component Value Date   NA 140 04/26/2024   K 4.0 04/26/2024   CO2 20 04/26/2024   GLUCOSE 97 04/26/2024   BUN 16 04/26/2024   CREATININE 0.75 (L) 04/26/2024   BILITOT 0.6 12/30/2022   ALKPHOS 46 12/30/2022   AST 12 12/30/2022   ALT 13 12/30/2022   PROT 7.3 12/30/2022   ALBUMIN 4.7 12/30/2022   CALCIUM  9.6 04/26/2024   ANIONGAP 9 10/26/2016   EGFR 104 04/26/2024   Lab Results  Component Value Date   CHOL 134 06/30/2023   Lab Results  Component Value Date   HDL 32 (L) 06/30/2023   Lab Results  Component Value Date   LDLCALC 80 06/30/2023   Lab Results  Component Value Date   TRIG 121 06/30/2023   Lab Results  Component Value Date   CHOLHDL 4.2 06/30/2023   Lab Results  Component Value Date   HGBA1C 11.7 (A) 03/07/2024      Assessment & Plan:   Problem List Items Addressed This Visit       Cardiovascular and Mediastinum   Essential hypertension   BP Readings from Last 3 Encounters:  06/28/24 117/66  04/26/24 111/69  03/07/24 110/69   HTN Controlled .  Continue lisinopril  2.5 mg daily. -Continue hydrochlorothiazide  12.5 mg daily.  No  changes in management. Discussed DASH diet and dietary sodium restrictions Continue to increase dietary efforts and exercise.           Respiratory   Chronic obstructive pulmonary disease (HCC)   Smoking cessation encouraged Continue albuterol  inhaler as needed        Endocrine   Uncontrolled type 2 diabetes  mellitus with hyperglycemia, without long-term current use of insulin (HCC)   Lab Results  Component Value Date   HGBA1C 11.7 (A) 03/07/2024   A1c controlled at 6.6%. Occasional hypoglycemia. Current medications: glipizide , metformin . - Continue glipizide  5 mg oral twice daily and metformin  1000 mg oral twice daily. - Encouraged dietary modifications with portion control for fruits. - Advised to report if blood sugar consistently drops below 70 mg/dL for potential adjustment of glipizide  dosage. - Encouraged 30 minutes of moderate exercise five days a week. Written  materials for hypoglycemia provided, advised to take medications with food Need for diabetes eye exam discussed encouraged to get test completed         Relevant Orders   POCT glycosylated hemoglobin (Hb A1C)     Other   Tobacco use disorder   Smokes about 1 pack/day  Asked about quitting: confirms that he currently smokes cigarettes Advise to quit smoking: Educated about QUITTING to reduce the risk of cancer, cardio and cerebrovascular disease. Assess willingness: willing to quit at this time, but is working on cutting back. Assist with counseling and pharmacotherapy: Counseled for 5 minutes and literature provided. Arrange for follow up: follow up in 4 months and continue to offer help. He will call the  -800-QUIT-NOW   to request      Hypertriglyceridemia   Lab Results  Component Value Date   CHOL 134 06/30/2023   HDL 32 (L) 06/30/2023   LDLCALC 80 06/30/2023   LDLDIRECT 62 11/09/2023   TRIG 121 06/30/2023   CHOLHDL 4.2 06/30/2023    Continue fenofibrate  145 mg daily  Checking lipid panel      Dyslipidemia, goal LDL below 70 - Primary   Lab Results  Component Value Date   CHOL 134 06/30/2023   HDL 32 (L) 06/30/2023   LDLCALC 80 06/30/2023   LDLDIRECT 62 11/09/2023   TRIG 121 06/30/2023   CHOLHDL 4.2 06/30/2023   - Continue rosuvastatin  20 mg daily.  Checking lipid panel      Relevant Orders    POCT glycosylated hemoglobin (Hb A1C)   Lipid panel   Health care maintenance    Encouraged vaccines and eye exam for diabetic retinopathy. - Encouraged receiving pneumonia and shingles vaccines. - Recommended annual eye exam for diabetic retinopathy screening.       No orders of the defined types were placed in this encounter.   Follow-up: Return in about 4 months (around 10/27/2024) for DM, HTN, HYPERLIPIDEMIA.    Marwa Fuhrman R Khadija Thier, FNP

## 2024-06-28 NOTE — Assessment & Plan Note (Signed)
 BP Readings from Last 3 Encounters:  06/28/24 117/66  04/26/24 111/69  03/07/24 110/69   HTN Controlled .  Continue lisinopril  2.5 mg daily. -Continue hydrochlorothiazide  12.5 mg daily.  No changes in management. Discussed DASH diet and dietary sodium restrictions Continue to increase dietary efforts and exercise.

## 2024-06-28 NOTE — Assessment & Plan Note (Addendum)
 Smokes about 1 pack/day  Asked about quitting: confirms that he currently smokes cigarettes Advise to quit smoking: Educated about QUITTING to reduce the risk of cancer, cardio and cerebrovascular disease. Assess willingness: willing to quit at this time, but is working on cutting back. Assist with counseling and pharmacotherapy: Counseled for 5 minutes and literature provided. Arrange for follow up: follow up in 4 months and continue to offer help. He will call the  [email or web address]-[email or web address]-NOW   to request

## 2024-06-29 ENCOUNTER — Ambulatory Visit: Payer: Self-pay | Admitting: Nurse Practitioner

## 2024-06-29 ENCOUNTER — Other Ambulatory Visit: Payer: Self-pay

## 2024-06-29 DIAGNOSIS — E785 Hyperlipidemia, unspecified: Secondary | ICD-10-CM

## 2024-06-29 LAB — LIPID PANEL
Chol/HDL Ratio: 3.9 ratio (ref 0.0–5.0)
Cholesterol, Total: 132 mg/dL (ref 100–199)
HDL: 34 mg/dL — ABNORMAL LOW (ref >39)
LDL Chol Calc (NIH): 79 mg/dL (ref 0–99)
Triglycerides: 102 mg/dL (ref 0–149)
VLDL Cholesterol Cal: 19 mg/dL (ref 5–40)

## 2024-06-29 MED ORDER — ROSUVASTATIN CALCIUM 40 MG PO TABS
40.0000 mg | ORAL_TABLET | Freq: Every day | ORAL | 3 refills | Status: AC
  Filled 2024-06-29: qty 90, 90d supply, fill #0

## 2024-06-30 ENCOUNTER — Other Ambulatory Visit: Payer: Self-pay

## 2024-07-01 ENCOUNTER — Other Ambulatory Visit: Payer: Self-pay

## 2024-08-01 ENCOUNTER — Other Ambulatory Visit: Payer: Self-pay

## 2024-08-02 ENCOUNTER — Other Ambulatory Visit: Payer: Self-pay | Admitting: Nurse Practitioner

## 2024-08-02 ENCOUNTER — Other Ambulatory Visit: Payer: Self-pay

## 2024-08-02 DIAGNOSIS — R42 Dizziness and giddiness: Secondary | ICD-10-CM

## 2024-08-02 MED ORDER — MECLIZINE HCL 25 MG PO TABS
25.0000 mg | ORAL_TABLET | Freq: Two times a day (BID) | ORAL | 2 refills | Status: AC | PRN
  Filled 2024-08-02: qty 30, 15d supply, fill #0

## 2024-08-02 NOTE — Telephone Encounter (Signed)
 Please advise [facility]

## 2024-08-02 NOTE — Telephone Encounter (Signed)
meclizine (ANTIVERT) 25 MG tablet ° °

## 2024-08-02 NOTE — Telephone Encounter (Signed)
 Copied from CRM #[id number]. Topic: Clinical - Medication Refill >> Aug 02, 2024  9:11 AM Darshell M wrote: Medication:  meclizine  (ANTIVERT ) 25 MG tablet  - patient requesting refills similar to medications so he does not have to request and wait for medication monthly.   Has the patient contacted their pharmacy? Yes (Agent: If no, request that the patient contact the pharmacy for the refill. If patient does not wish to contact the pharmacy document the reason why and proceed with request.) (Agent: If yes, when and what did the pharmacy advise?)  This is the patient's preferred pharmacy:  [facility] [address], [address] [place] [address] Phone: [phone] Fax: [phone]  Is this the correct pharmacy for this prescription? Yes If no, delete pharmacy and type the correct one.   Has the prescription been filled recently? No  Is the patient out of the medication? Yes  Has the patient been seen for an appointment in the last year OR does the patient have an upcoming appointment? Yes  Can we respond through MyChart? No  Agent: Please be advised that Rx refills may take up to 3 business days. We ask that you follow-up with your pharmacy.

## 2024-08-02 NOTE — Telephone Encounter (Signed)
 Per telephone encounter, patient requesting refills similar to medications so he does not have to request and wait for medication monthly.

## 2024-08-23 ENCOUNTER — Other Ambulatory Visit: Payer: Self-pay

## 2024-08-23 ENCOUNTER — Other Ambulatory Visit: Payer: Self-pay | Admitting: Nurse Practitioner

## 2024-08-23 DIAGNOSIS — E1149 Type 2 diabetes mellitus with other diabetic neurological complication: Secondary | ICD-10-CM

## 2024-08-23 MED ORDER — GLIPIZIDE 5 MG PO TABS
5.0000 mg | ORAL_TABLET | Freq: Two times a day (BID) | ORAL | 1 refills | Status: AC
  Filled 2024-08-23: qty 90, 45d supply, fill #0

## 2024-08-25 ENCOUNTER — Other Ambulatory Visit: Payer: Self-pay

## 2024-10-28 ENCOUNTER — Ambulatory Visit: Payer: Self-pay | Admitting: Nurse Practitioner
